# Patient Record
Sex: Female | Born: 1990 | Race: White | Hispanic: No | Marital: Married | Smoking: Never smoker
Health system: Southern US, Community
[De-identification: ages and names within clinical notes are randomized; demographics above are authoritative.]

## PROBLEM LIST (undated history)

## (undated) DIAGNOSIS — F909 Attention-deficit hyperactivity disorder, unspecified type: Secondary | ICD-10-CM

## (undated) DIAGNOSIS — F32A Depression, unspecified: Secondary | ICD-10-CM

## (undated) DIAGNOSIS — F329 Major depressive disorder, single episode, unspecified: Secondary | ICD-10-CM

## (undated) HISTORY — DX: Depression, unspecified: F32.A

## (undated) HISTORY — DX: Attention-deficit hyperactivity disorder, unspecified type: F90.9

---
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
## 2016-04-02 MED FILL — DEXTROAMP-AMP 10 MG TAB: 10 | 30 days supply | Qty: 60 | Fill #0

## 2016-04-28 DIAGNOSIS — Z124 Encounter for screening for malignant neoplasm of cervix: Secondary | ICD-10-CM | POA: Diagnosis not present

## 2016-04-28 DIAGNOSIS — Z6826 Body mass index (BMI) 26.0-26.9, adult: Secondary | ICD-10-CM | POA: Diagnosis not present

## 2016-04-28 DIAGNOSIS — Z01419 Encounter for gynecological examination (general) (routine) without abnormal findings: Secondary | ICD-10-CM | POA: Diagnosis not present

## 2016-05-01 DIAGNOSIS — Z30430 Encounter for insertion of intrauterine contraceptive device: Secondary | ICD-10-CM | POA: Diagnosis not present

## 2016-05-01 MED FILL — DEXTROAMP-AMP 10 MG TAB: 10 | 30 days supply | Qty: 60 | Fill #0

## 2016-06-04 MED FILL — predniSONE 10 MG TABS: 10 | 6 days supply | Qty: 21 | Fill #0

## 2016-07-15 MED FILL — TRETINOIN 0.025% CREAM: 0.025 | 7 days supply | Qty: 20 | Fill #0

## 2017-01-19 DIAGNOSIS — N912 Amenorrhea, unspecified: Secondary | ICD-10-CM | POA: Insufficient documentation

## 2017-01-19 DIAGNOSIS — G4700 Insomnia, unspecified: Secondary | ICD-10-CM | POA: Insufficient documentation

## 2017-01-19 DIAGNOSIS — R002 Palpitations: Secondary | ICD-10-CM | POA: Insufficient documentation

## 2017-01-19 DIAGNOSIS — J309 Allergic rhinitis, unspecified: Secondary | ICD-10-CM | POA: Insufficient documentation

## 2017-01-19 HISTORY — DX: Insomnia, unspecified: G47.00

## 2017-01-20 MED FILL — AMPHETAMINE SALTS 10 MG TAB: 10 | 30 days supply | Qty: 60 | Fill #0

## 2017-02-18 MED FILL — DEXTROAMP-AMP 10 MG TAB: 10 | 30 days supply | Qty: 60 | Fill #0

## 2017-03-19 MED FILL — DEXTROAMP-AMP 10 MG TAB: 10 | 30 days supply | Qty: 60 | Fill #0

## 2017-05-03 MED FILL — TRETINOIN 0.025% CREAM: 0.025 | 7 days supply | Qty: 20 | Fill #1

## 2017-09-02 DIAGNOSIS — Z3491 Encounter for supervision of normal pregnancy, unspecified, first trimester: Secondary | ICD-10-CM | POA: Diagnosis not present

## 2017-09-02 DIAGNOSIS — Z113 Encounter for screening for infections with a predominantly sexual mode of transmission: Secondary | ICD-10-CM | POA: Diagnosis not present

## 2017-09-02 DIAGNOSIS — Z3490 Encounter for supervision of normal pregnancy, unspecified, unspecified trimester: Secondary | ICD-10-CM | POA: Insufficient documentation

## 2017-09-02 LAB — OB RESULTS CONSOLE HEPATITIS B SURFACE ANTIGEN: Hepatitis B Surface Ag: NEGATIVE

## 2017-09-02 LAB — OB RESULTS CONSOLE HIV ANTIBODY (ROUTINE TESTING): HIV: NONREACTIVE

## 2017-09-02 LAB — OB RESULTS CONSOLE RUBELLA ANTIBODY, IGM: Rubella: IMMUNE

## 2017-09-02 LAB — OB RESULTS CONSOLE GC/CHLAMYDIA
CHLAMYDIA, DNA PROBE: NEGATIVE
Gonorrhea: NEGATIVE

## 2017-09-02 LAB — OB RESULTS CONSOLE RPR: RPR: NONREACTIVE

## 2017-09-02 LAB — OB RESULTS CONSOLE ABO/RH: RH Type: POSITIVE

## 2017-09-02 LAB — OB RESULTS CONSOLE ANTIBODY SCREEN: ANTIBODY SCREEN: NEGATIVE

## 2017-09-04 DIAGNOSIS — Z9889 Other specified postprocedural states: Secondary | ICD-10-CM

## 2017-09-04 DIAGNOSIS — O99810 Abnormal glucose complicating pregnancy: Secondary | ICD-10-CM | POA: Insufficient documentation

## 2017-09-04 DIAGNOSIS — Z91048 Other nonmedicinal substance allergy status: Secondary | ICD-10-CM | POA: Insufficient documentation

## 2017-09-04 HISTORY — DX: Other specified postprocedural states: Z98.890

## 2017-09-04 HISTORY — DX: Abnormal glucose complicating pregnancy: O99.810

## 2017-09-07 DIAGNOSIS — Z3A09 9 weeks gestation of pregnancy: Secondary | ICD-10-CM | POA: Diagnosis not present

## 2017-09-07 DIAGNOSIS — O99810 Abnormal glucose complicating pregnancy: Secondary | ICD-10-CM | POA: Diagnosis not present

## 2017-09-07 DIAGNOSIS — Z3491 Encounter for supervision of normal pregnancy, unspecified, first trimester: Secondary | ICD-10-CM | POA: Diagnosis not present

## 2017-09-07 DIAGNOSIS — O3680X9 Pregnancy with inconclusive fetal viability, other fetus: Secondary | ICD-10-CM | POA: Diagnosis not present

## 2017-09-07 DIAGNOSIS — Z3A01 Less than 8 weeks gestation of pregnancy: Secondary | ICD-10-CM | POA: Diagnosis not present

## 2017-09-22 MED FILL — BUPROPION SR 150 MG TABLET: 150 | 30 days supply | Qty: 60 | Fill #0

## 2017-09-30 DIAGNOSIS — Z3481 Encounter for supervision of other normal pregnancy, first trimester: Secondary | ICD-10-CM | POA: Diagnosis not present

## 2017-10-20 MED FILL — BUPROPION SR 150 MG TABLET: 150 | 30 days supply | Qty: 60 | Fill #1

## 2017-11-22 MED FILL — BUPROPION SR 150 MG TABLET: 150 | 30 days supply | Qty: 60 | Fill #2

## 2017-11-24 DIAGNOSIS — Z363 Encounter for antenatal screening for malformations: Secondary | ICD-10-CM | POA: Diagnosis not present

## 2017-11-24 DIAGNOSIS — Z3A20 20 weeks gestation of pregnancy: Secondary | ICD-10-CM | POA: Diagnosis not present

## 2017-12-11 DIAGNOSIS — M79662 Pain in left lower leg: Secondary | ICD-10-CM | POA: Diagnosis not present

## 2017-12-11 DIAGNOSIS — M79605 Pain in left leg: Secondary | ICD-10-CM | POA: Diagnosis not present

## 2017-12-22 MED FILL — BUPROPION SR 150 MG TABLET: 150 | 30 days supply | Qty: 60 | Fill #0

## 2018-01-20 DIAGNOSIS — Z23 Encounter for immunization: Secondary | ICD-10-CM | POA: Diagnosis not present

## 2018-01-20 DIAGNOSIS — Z3493 Encounter for supervision of normal pregnancy, unspecified, third trimester: Secondary | ICD-10-CM | POA: Diagnosis not present

## 2018-01-20 DIAGNOSIS — Z3A28 28 weeks gestation of pregnancy: Secondary | ICD-10-CM | POA: Diagnosis not present

## 2018-01-24 MED FILL — BUPROPION SR 150 MG TABLET: 150 | 30 days supply | Qty: 60 | Fill #1

## 2018-01-28 DIAGNOSIS — Z369 Encounter for antenatal screening, unspecified: Secondary | ICD-10-CM | POA: Diagnosis not present

## 2018-01-31 DIAGNOSIS — Z3A29 29 weeks gestation of pregnancy: Secondary | ICD-10-CM | POA: Diagnosis not present

## 2018-01-31 DIAGNOSIS — O26849 Uterine size-date discrepancy, unspecified trimester: Secondary | ICD-10-CM | POA: Diagnosis not present

## 2018-02-08 MED FILL — FUSION PLUS CAPSULE: 30 days supply | Qty: 30 | Fill #0

## 2018-02-09 DIAGNOSIS — O99810 Abnormal glucose complicating pregnancy: Secondary | ICD-10-CM | POA: Diagnosis not present

## 2018-02-21 MED FILL — BUPROPION SR 150 MG TABLET: 150 | 30 days supply | Qty: 60 | Fill #2

## 2018-03-03 DIAGNOSIS — Z3A34 34 weeks gestation of pregnancy: Secondary | ICD-10-CM | POA: Diagnosis not present

## 2018-03-03 DIAGNOSIS — Z3493 Encounter for supervision of normal pregnancy, unspecified, third trimester: Secondary | ICD-10-CM | POA: Diagnosis not present

## 2018-03-03 DIAGNOSIS — O321XX9 Maternal care for breech presentation, other fetus: Secondary | ICD-10-CM | POA: Diagnosis not present

## 2018-03-17 ENCOUNTER — Other Ambulatory Visit: Payer: Self-pay | Admitting: Obstetrics and Gynecology

## 2018-03-17 DIAGNOSIS — Z3492 Encounter for supervision of normal pregnancy, unspecified, second trimester: Secondary | ICD-10-CM | POA: Diagnosis not present

## 2018-03-18 ENCOUNTER — Other Ambulatory Visit: Payer: Self-pay | Admitting: Obstetrics & Gynecology

## 2018-03-22 ENCOUNTER — Encounter (HOSPITAL_COMMUNITY): Payer: Self-pay | Admitting: *Deleted

## 2018-03-22 ENCOUNTER — Inpatient Hospital Stay (HOSPITAL_COMMUNITY)
Admission: AD | Admit: 2018-03-22 | Discharge: 2018-03-26 | DRG: 788 | Disposition: A | Discharge status: Home / Self Care | Payer: 59 | Attending: Obstetrics and Gynecology | Admitting: Obstetrics and Gynecology

## 2018-03-22 ENCOUNTER — Telehealth (HOSPITAL_COMMUNITY): Payer: Self-pay | Admitting: *Deleted

## 2018-03-22 ENCOUNTER — Inpatient Hospital Stay (HOSPITAL_BASED_OUTPATIENT_CLINIC_OR_DEPARTMENT_OTHER): Payer: 59

## 2018-03-22 DIAGNOSIS — Z3493 Encounter for supervision of normal pregnancy, unspecified, third trimester: Secondary | ICD-10-CM | POA: Diagnosis not present

## 2018-03-22 DIAGNOSIS — O368190 Decreased fetal movements, unspecified trimester, not applicable or unspecified: Secondary | ICD-10-CM

## 2018-03-22 DIAGNOSIS — O368130 Decreased fetal movements, third trimester, not applicable or unspecified: Secondary | ICD-10-CM | POA: Diagnosis present

## 2018-03-22 DIAGNOSIS — Z3A00 Weeks of gestation of pregnancy not specified: Secondary | ICD-10-CM | POA: Diagnosis not present

## 2018-03-22 DIAGNOSIS — O321XX0 Maternal care for breech presentation, not applicable or unspecified: Secondary | ICD-10-CM | POA: Diagnosis not present

## 2018-03-22 DIAGNOSIS — Z98891 History of uterine scar from previous surgery: Secondary | ICD-10-CM

## 2018-03-22 DIAGNOSIS — Z3A37 37 weeks gestation of pregnancy: Secondary | ICD-10-CM

## 2018-03-22 DIAGNOSIS — O368199 Decreased fetal movements, unspecified trimester, other fetus: Secondary | ICD-10-CM | POA: Diagnosis not present

## 2018-03-22 DIAGNOSIS — Z369 Encounter for antenatal screening, unspecified: Secondary | ICD-10-CM | POA: Diagnosis not present

## 2018-03-22 DIAGNOSIS — O26853 Spotting complicating pregnancy, third trimester: Secondary | ICD-10-CM | POA: Diagnosis not present

## 2018-03-22 LAB — URINALYSIS, ROUTINE W REFLEX MICROSCOPIC
BILIRUBIN URINE: NEGATIVE
Glucose, UA: NEGATIVE mg/dL
KETONES UR: NEGATIVE mg/dL
Leukocytes, UA: NEGATIVE
NITRITE: NEGATIVE
PROTEIN: NEGATIVE mg/dL
Specific Gravity, Urine: 1.009 (ref 1.005–1.030)
pH: 6 (ref 5.0–8.0)

## 2018-03-22 LAB — CBC
HCT: 36.9 % (ref 36.0–46.0)
Hemoglobin: 12.5 g/dL (ref 12.0–15.0)
MCH: 30.6 pg (ref 26.0–34.0)
MCHC: 33.9 g/dL (ref 30.0–36.0)
MCV: 90.4 fL (ref 78.0–100.0)
PLATELETS: 236 10*3/uL (ref 150–400)
RBC: 4.08 MIL/uL (ref 3.87–5.11)
RDW: 13.8 % (ref 11.5–15.5)
WBC: 14.6 10*3/uL — ABNORMAL HIGH (ref 4.0–10.5)

## 2018-03-22 LAB — TYPE AND SCREEN
ABO/RH(D): A POS
Antibody Screen: NEGATIVE

## 2018-03-22 LAB — ABO/RH: ABO/RH(D): A POS

## 2018-03-22 IMAGING — US US MFM FETAL BPP W/O NON-STRESS
1 series · 12 of 24 positions shown · non-contrast
Comparison: none

[Series 1: us mfm fetal bpp w/o non-stress · 24 acquisitions, 12 frames shown]
[im 2/24]
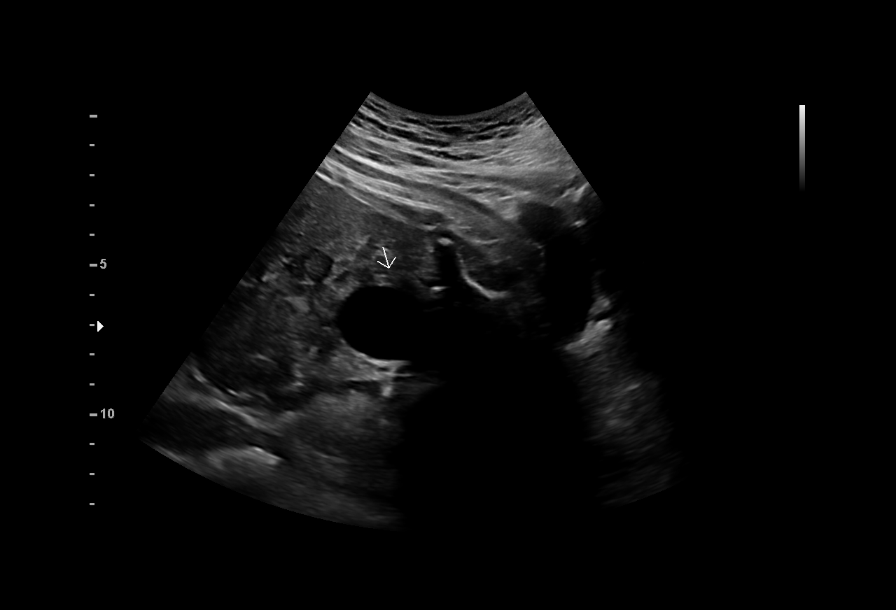
[im 4/24]
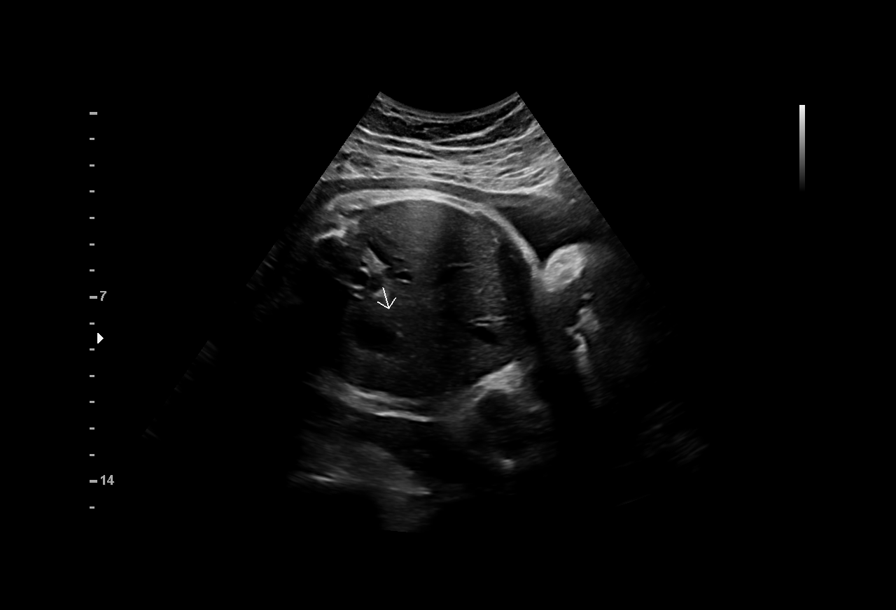
[im 6/24]
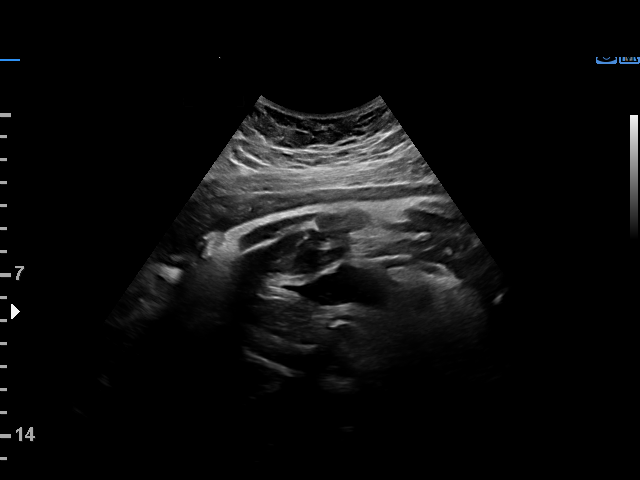
[im 8/24]
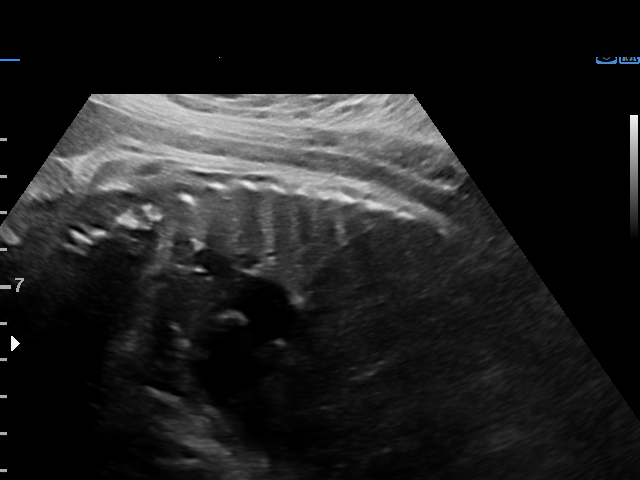
[im 10/24]
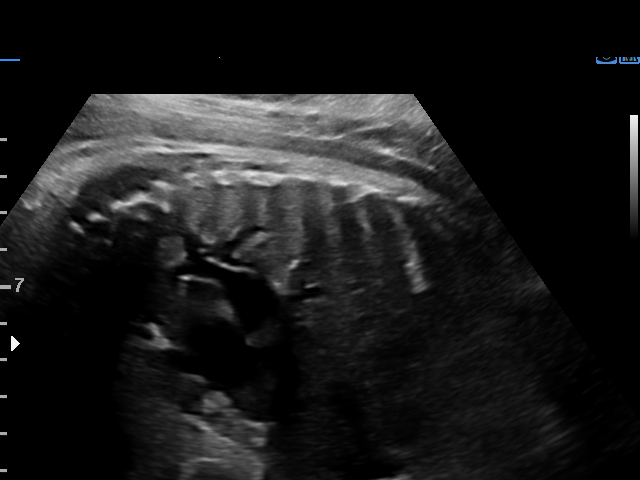
[im 12/24]
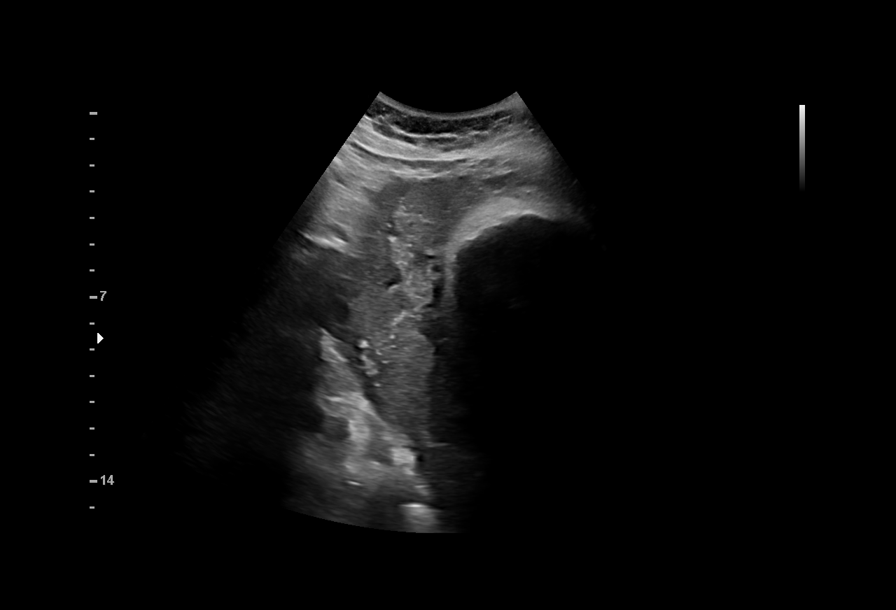
[im 14/24]
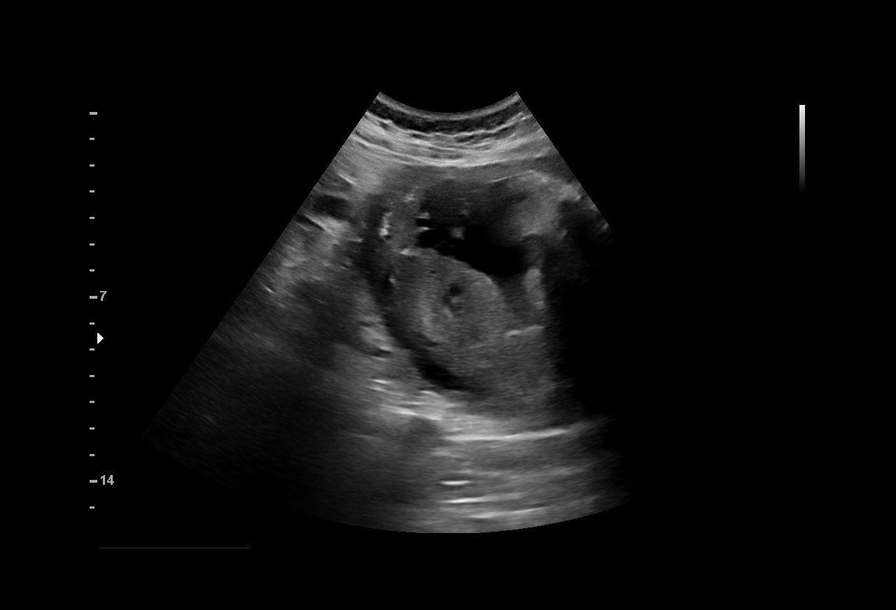
[im 16/24]
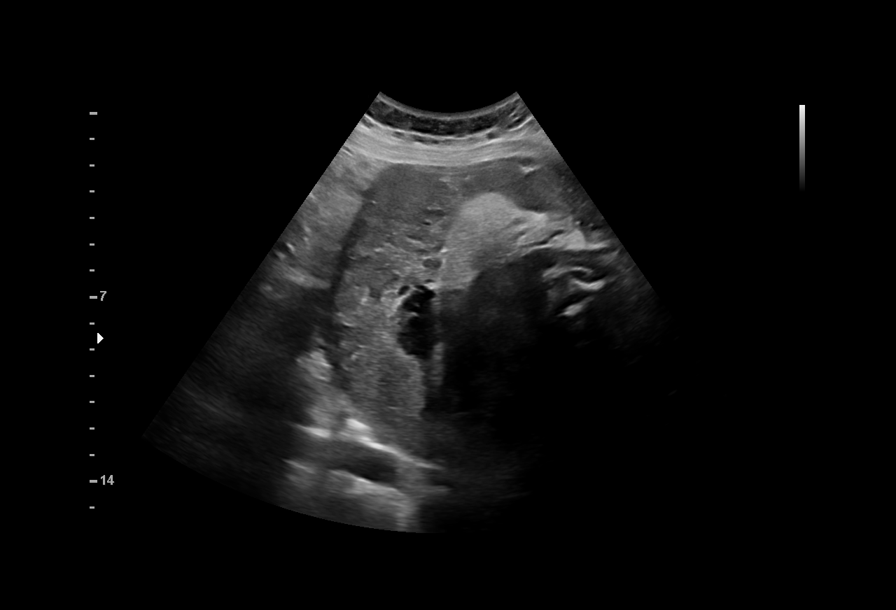
[im 18/24]
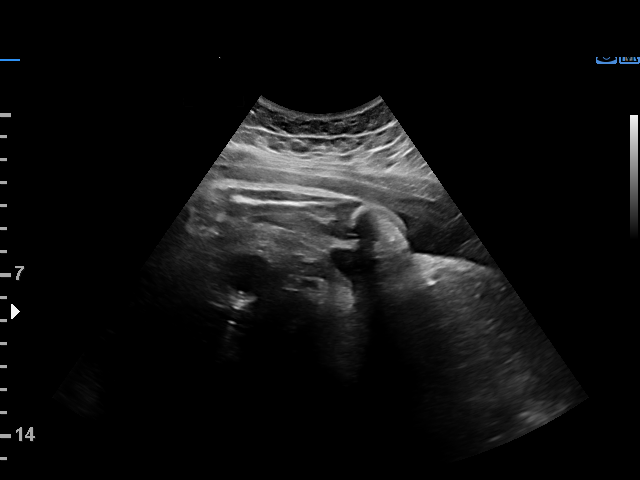
[im 20/24]
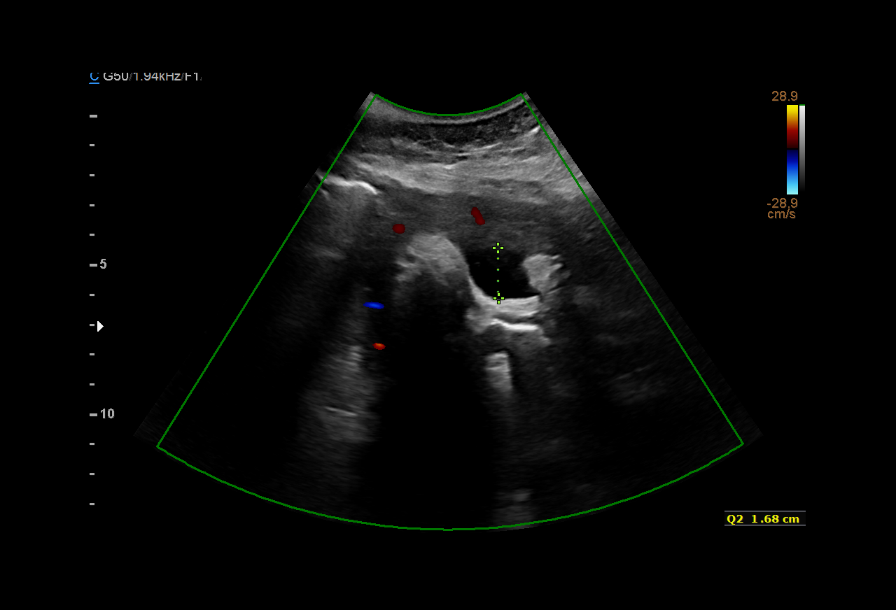
[im 22/24]
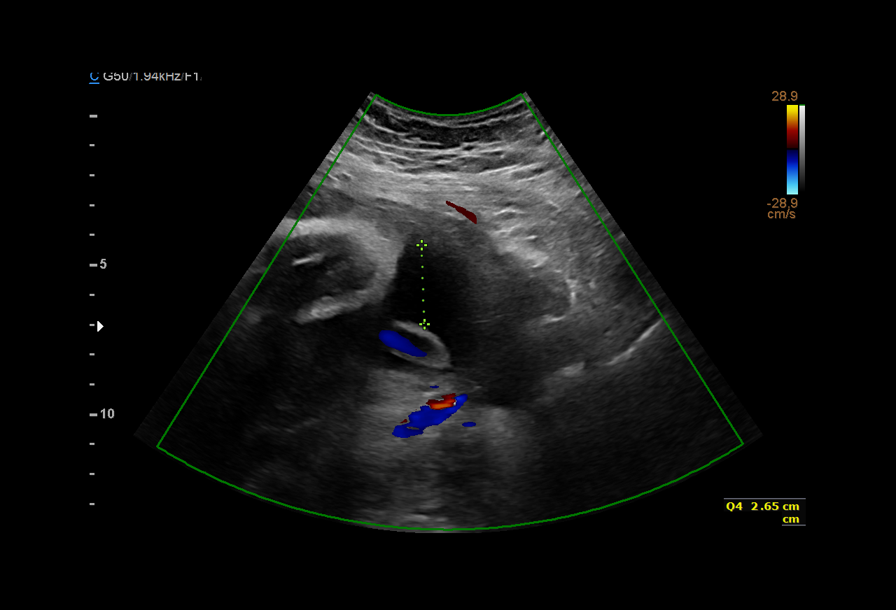
[im 24/24]
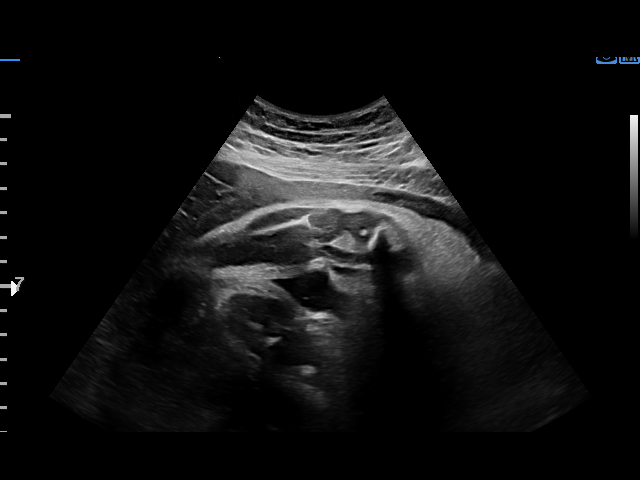

[12 of 24 positions shown; findings below may reference images not displayed]

1  HALEKA           [PHONE_NUMBER]      [PHONE_NUMBER]     [PHONE_NUMBER]
Indications

37 weeks gestation of pregnancy
Decreased fetal movement                       [RE]
OB History

Gravidity:    1
Fetal Evaluation

Num Of Fetuses:     1
Fetal Heart         140
Rate(bpm):
Cardiac Activity:   Observed
Presentation:       Breech
Placenta:           Posterior

Amniotic Fluid
AFI FV:      Subjectively within normal limits

AFI Sum(cm)     %Tile       Largest Pocket(cm)
7.73            7

RUQ(cm)       RLQ(cm)       LUQ(cm)        LLQ(cm)
3.4           2.65          1.68           0
Biophysical Evaluation

Amniotic F.V:   Within normal limits       F. Tone:        Observed
F. Movement:    Observed                   Score:          [DATE]
F. Breathing:   Observed
Gestational Age

Clinical EDD:  37w 0d                                        EDD:   [DATE]
Best:          37w 0d     Det. By:  Clinical EDD             EDD:   [DATE]
Impression

Patient was evaluated for decreased fetal movements and
vaginal bleeding.
Antenatal testing is reassuring. BPP [DATE]. BREECH
presentation.

## 2018-03-22 MED ORDER — ONDANSETRON HCL 4 MG/2ML IJ SOLN
INTRAMUSCULAR | Status: AC
  Filled 2018-03-22: qty 2

## 2018-03-22 MED ORDER — DEXAMETHASONE SODIUM PHOSPHATE 4 MG/ML IJ SOLN
INTRAMUSCULAR | Status: AC
  Filled 2018-03-22: qty 1

## 2018-03-22 MED ORDER — OXYTOCIN 10 UNIT/ML IJ SOLN
INTRAMUSCULAR | Status: AC
  Filled 2018-03-22: qty 4

## 2018-03-22 MED ORDER — LACTATED RINGERS IV SOLN
INTRAVENOUS | Status: DC
  Administered 2018-03-22 – 2018-03-23 (×2): via INTRAVENOUS

## 2018-03-22 MED ORDER — CEFAZOLIN SODIUM-DEXTROSE 2-4 GM/100ML-% IV SOLN
2.0000 g | INTRAVENOUS | Status: AC
  Administered 2018-03-23: 2 g via INTRAVENOUS
  Filled 2018-03-22: qty 100

## 2018-03-22 MED ORDER — FENTANYL CITRATE (PF) 100 MCG/2ML IJ SOLN
INTRAMUSCULAR | Status: AC
  Filled 2018-03-22: qty 2

## 2018-03-22 MED ORDER — PHENYLEPHRINE 8 MG IN D5W 100 ML (0.08MG/ML) PREMIX OPTIME
INJECTION | INTRAVENOUS | Status: AC
  Filled 2018-03-22: qty 100

## 2018-03-22 MED ORDER — FAMOTIDINE IN NACL 20-0.9 MG/50ML-% IV SOLN
20.0000 mg | Freq: Once | INTRAVENOUS | Status: AC
  Administered 2018-03-22: 20 mg via INTRAVENOUS
  Filled 2018-03-22: qty 50

## 2018-03-22 MED ORDER — SCOPOLAMINE 1 MG/3DAYS TD PT72
MEDICATED_PATCH | TRANSDERMAL | Status: AC
  Filled 2018-03-22: qty 1

## 2018-03-22 MED ORDER — SOD CITRATE-CITRIC ACID 500-334 MG/5ML PO SOLN
30.0000 mL | Freq: Once | ORAL | Status: AC
  Administered 2018-03-23: 30 mL via ORAL
  Filled 2018-03-22: qty 15

## 2018-03-22 NOTE — Anesthesia Preprocedure Evaluation (Signed)
Anesthesia Evaluation  Patient identified by MRN, date of birth, ID band Patient awake    Reviewed: Allergy & Precautions, H&P , NPO status , Patient's Chart, lab work & pertinent test results  Airway Mallampati: I  TM Distance: >3 FB Neck ROM: full    Dental no notable dental hx. (+) Teeth Intact   Pulmonary neg pulmonary ROS,    Pulmonary exam normal breath sounds clear to auscultation       Cardiovascular negative cardio ROS Normal cardiovascular exam Rhythm:regular Rate:Normal     Neuro/Psych negative neurological ROS  negative psych ROS   GI/Hepatic negative GI ROS, Neg liver ROS,   Endo/Other  negative endocrine ROS  Renal/GU negative Renal ROS  negative genitourinary   Musculoskeletal   Abdominal   Peds  Hematology negative hematology ROS (+)   Anesthesia Other Findings   Reproductive/Obstetrics (+) Pregnancy                             Anesthesia Physical Anesthesia Plan  ASA: II  Anesthesia Plan: Spinal   Post-op Pain Management:    Induction:   PONV Risk Score and Plan: 3 and Ondansetron, Dexamethasone and Scopolamine patch - Pre-op  Airway Management Planned: Natural Airway and Nasal Cannula  Additional Equipment:   Intra-op Plan:   Post-operative Plan:   Informed Consent: I have reviewed the patients History and Physical, chart, labs and discussed the procedure including the risks, benefits and alternatives for the proposed anesthesia with the patient or authorized representative who has indicated his/her understanding and acceptance.     Plan Discussed with: CRNA and Surgeon  Anesthesia Plan Comments:         Anesthesia Quick Evaluation

## 2018-03-22 NOTE — Telephone Encounter (Signed)
Preadmission screen  

## 2018-03-22 NOTE — H&P (Addendum)
Catherine Chang is a 27 y.o. female presenting for cesarean section due to breech presentation with labor and vaginal bleeding. OB History    Gravida  1   Para      Term      Preterm      AB      Living        SAB      TAB      Ectopic      Multiple      Live Births             History reviewed. No pertinent past medical history. Past Surgical History:  Procedure Laterality Date  . BREAST REDUCTION SURGERY Bilateral 2006   Family History: family history includes Heart disease in her maternal grandfather and paternal grandfather; Hypothyroidism in her mother. Social History:  reports that she has never smoked. She has never used smokeless tobacco. She reports that she does not drink alcohol or use drugs.     Maternal Diabetes: No Genetic Screening: Normal Maternal Ultrasounds/Referrals: Normal Fetal Ultrasounds or other Referrals:  None Maternal Substance Abuse:  No Significant Maternal Medications:  None Significant Maternal Lab Results:  None Other Comments:  None  Review of Systems  Gastrointestinal: Positive for abdominal pain.  All other systems reviewed and are negative.  Maternal Medical History:  Reason for admission: Contractions and vaginal bleeding.   Contractions: Onset was 3-5 hours ago.   Frequency: regular.   Duration is approximately 60 seconds.   Perceived severity is moderate.    Fetal activity: Perceived fetal activity is normal.   Last perceived fetal movement was within the past hour.    Prenatal complications: Bleeding.   Prenatal Complications - Diabetes: none.    Dilation: 1 Effacement (%): 50 Station: -2 Exam by:: Ranee Gosselin, [name]   Vitals:   03/22/18 1848 03/22/18 1910 03/22/18 2226  BP: 123/78    Pulse: 97    Resp: 16    Temp: 98.5 F (36.9 C) 98.5 F (36.9 C)   TempSrc: Oral    SpO2: 99%    Weight:   80 kg (176 lb 6 oz)     No results found for this or any previous visit (from the past 24  hour(s)).  Maternal Exam:  Uterine Assessment: Contraction strength is moderate.  Contraction duration is 60 seconds. Contraction frequency is irregular.   Abdomen: Patient reports no abdominal tenderness. Fundal height is Size=dates.   Estimated fetal weight is 5lbs14oz.   Fetal presentation: breech  Introitus: Normal vulva. Normal vagina.  Ferning test: negative.   Cervix: Cervix evaluated by digital exam.     Fetal Exam Fetal Monitor Review: Mode: ultrasound.   Baseline rate: 140.  Variability: moderate (6-25 bpm).   Pattern: accelerations present and no decelerations.    Fetal State Assessment: Category I - tracings are normal.     Physical Exam  Nursing note and vitals reviewed. Constitutional: She is oriented to person, place, and time. She appears well-developed and well-nourished.  HENT:  Head: Normocephalic.  Eyes: Pupils are equal, round, and reactive to light.  Cardiovascular: Normal rate, regular rhythm and normal heart sounds.  Respiratory: Effort normal and breath sounds normal.  Genitourinary: Vagina normal and uterus normal.  Musculoskeletal: Normal range of motion.  Neurological: She is alert and oriented to person, place, and time.  Skin: Skin is warm and dry.  Psychiatric: She has a normal mood and affect. Her behavior is normal. Judgment and thought content normal.  Prenatal labs: ABO, Rh: A/Positive/-- (01/17 0000) Antibody: Negative (01/17 0000) Rubella: Immune (01/17 0000) RPR: Nonreactive (01/17 0000)  HBsAg: Negative (01/17 0000)  HIV: Non-reactive (01/17 0000)  GBS:   Negative  Assessment/Plan: 27 y.o. G1P0 at 37 weeks Category 1 FHTs, reactive NST BPP 8/8 Labor and vaginal bleeding  Breech presentation Admit to Pre-op Cesarean section with Dr. Kerry [place] 03/22/2018, 10:21 PM  Agree with above.  Called by Ranee Gosselin, [name] with report that when pt stood a gush of blood came out and she thought the pt was abrupting and  had made change from closed to 1cm.  Lissa Merlin reported increasingly painful contractions.  Pt has no risk factors for abruption and tracing is cat 1.  I discussed proceeding with c/s which Lissa Merlin had already discussed with the patient.  The r/b/a were reviewed including but not limited to bleeding infection and injury.  Pt agreeable to proceed with cesarean section.  Ready to proceed at 1108 (03/22/18) when called by blood bank stating T&S was resulted.

## 2018-03-23 ENCOUNTER — Inpatient Hospital Stay (HOSPITAL_COMMUNITY): Payer: 59 | Admitting: Anesthesiology

## 2018-03-23 ENCOUNTER — Encounter (HOSPITAL_COMMUNITY)
Admission: AD | Disposition: A | Discharge status: Home / Self Care | Payer: Self-pay | Source: Home / Self Care | Attending: Obstetrics and Gynecology

## 2018-03-23 ENCOUNTER — Encounter (HOSPITAL_COMMUNITY): Payer: Self-pay | Admitting: *Deleted

## 2018-03-23 DIAGNOSIS — Z98891 History of uterine scar from previous surgery: Secondary | ICD-10-CM

## 2018-03-23 LAB — RPR: RPR Ser Ql: NONREACTIVE

## 2018-03-23 LAB — CBC
HCT: 32.8 % — ABNORMAL LOW (ref 36.0–46.0)
Hemoglobin: 11.2 g/dL — ABNORMAL LOW (ref 12.0–15.0)
MCH: 31 pg (ref 26.0–34.0)
MCHC: 34.1 g/dL (ref 30.0–36.0)
MCV: 90.9 fL (ref 78.0–100.0)
PLATELETS: 198 10*3/uL (ref 150–400)
RBC: 3.61 MIL/uL — AB (ref 3.87–5.11)
RDW: 13.7 % (ref 11.5–15.5)
WBC: 17.5 10*3/uL — ABNORMAL HIGH (ref 4.0–10.5)

## 2018-03-23 SURGERY — Surgical Case
Anesthesia: Spinal

## 2018-03-23 MED ORDER — ACETAMINOPHEN 325 MG PO TABS: 650.0000 mg | ORAL_TABLET | ORAL | Status: DC | PRN

## 2018-03-23 MED ORDER — IBUPROFEN 600 MG PO TABS
600.0000 mg | ORAL_TABLET | Freq: Four times a day (QID) | ORAL | Status: DC
  Administered 2018-03-23 – 2018-03-26 (×14): 600 mg via ORAL
  Filled 2018-03-23 (×14): qty 1

## 2018-03-23 MED ORDER — OXYTOCIN 10 UNIT/ML IJ SOLN
INTRAMUSCULAR | Status: DC | PRN
  Administered 2018-03-23: 40 [IU] via INTRAVENOUS

## 2018-03-23 MED ORDER — ACETAMINOPHEN 10 MG/ML IV SOLN
INTRAVENOUS | Status: DC | PRN
  Administered 2018-03-23: 1000 mg via INTRAVENOUS

## 2018-03-23 MED ORDER — HYDROMORPHONE HCL 1 MG/ML IJ SOLN
INTRAMUSCULAR | Status: DC | PRN
  Administered 2018-03-23: 1 mg via INTRAVENOUS

## 2018-03-23 MED ORDER — ONDANSETRON HCL 4 MG/2ML IJ SOLN
INTRAMUSCULAR | Status: DC | PRN
  Administered 2018-03-23: 4 mg via INTRAVENOUS

## 2018-03-23 MED ORDER — HYDROMORPHONE HCL 1 MG/ML IJ SOLN
INTRAMUSCULAR | Status: AC
  Filled 2018-03-23: qty 0.5

## 2018-03-23 MED ORDER — SIMETHICONE 80 MG PO CHEW
80.0000 mg | CHEWABLE_TABLET | Freq: Three times a day (TID) | ORAL | Status: DC
  Administered 2018-03-23 – 2018-03-26 (×8): 80 mg via ORAL
  Filled 2018-03-23 (×9): qty 1

## 2018-03-23 MED ORDER — WITCH HAZEL-GLYCERIN EX PADS: 1.0000 "application " | MEDICATED_PAD | CUTANEOUS | Status: DC | PRN

## 2018-03-23 MED ORDER — MEPERIDINE HCL 25 MG/ML IJ SOLN: 6.2500 mg | INTRAMUSCULAR | Status: DC | PRN

## 2018-03-23 MED ORDER — MENTHOL 3 MG MT LOZG: 1.0000 | LOZENGE | OROMUCOSAL | Status: DC | PRN

## 2018-03-23 MED ORDER — DIBUCAINE 1 % RE OINT: 1.0000 "application " | TOPICAL_OINTMENT | RECTAL | Status: DC | PRN

## 2018-03-23 MED ORDER — SENNOSIDES-DOCUSATE SODIUM 8.6-50 MG PO TABS
2.0000 | ORAL_TABLET | ORAL | Status: DC
  Administered 2018-03-24 – 2018-03-25 (×3): 2 via ORAL
  Filled 2018-03-23 (×3): qty 2

## 2018-03-23 MED ORDER — SCOPOLAMINE 1 MG/3DAYS TD PT72
MEDICATED_PATCH | TRANSDERMAL | Status: DC | PRN
  Administered 2018-03-23: 1 via TRANSDERMAL

## 2018-03-23 MED ORDER — BUPIVACAINE IN DEXTROSE 0.75-8.25 % IT SOLN
INTRATHECAL | Status: DC | PRN
  Administered 2018-03-23: 1.6 mL via INTRATHECAL

## 2018-03-23 MED ORDER — SODIUM CHLORIDE 0.9 % IR SOLN
Status: DC | PRN
  Administered 2018-03-23: 1

## 2018-03-23 MED ORDER — PHENYLEPHRINE 8 MG IN D5W 100 ML (0.08MG/ML) PREMIX OPTIME
INJECTION | INTRAVENOUS | Status: DC | PRN
  Administered 2018-03-23: 60 ug/min via INTRAVENOUS

## 2018-03-23 MED ORDER — OXYTOCIN 40 UNITS IN LACTATED RINGERS INFUSION - SIMPLE MED: 2.5000 [IU]/h | INTRAVENOUS | Status: AC

## 2018-03-23 MED ORDER — LACTATED RINGERS IV SOLN
INTRAVENOUS | Status: DC
  Administered 2018-03-23: 15:00:00 via INTRAVENOUS

## 2018-03-23 MED ORDER — HYDROMORPHONE HCL 1 MG/ML IJ SOLN
INTRAMUSCULAR | Status: AC
  Filled 2018-03-23: qty 1

## 2018-03-23 MED ORDER — SIMETHICONE 80 MG PO CHEW
80.0000 mg | CHEWABLE_TABLET | ORAL | Status: DC | PRN
  Administered 2018-03-23: 80 mg via ORAL

## 2018-03-23 MED ORDER — KETOROLAC TROMETHAMINE 30 MG/ML IJ SOLN
INTRAMUSCULAR | Status: AC
  Filled 2018-03-23: qty 1

## 2018-03-23 MED ORDER — OXYCODONE HCL 5 MG PO TABS
10.0000 mg | ORAL_TABLET | ORAL | Status: DC | PRN
  Administered 2018-03-23 – 2018-03-25 (×8): 10 mg via ORAL
  Filled 2018-03-23 (×7): qty 2

## 2018-03-23 MED ORDER — TETANUS-DIPHTH-ACELL PERTUSSIS 5-2.5-18.5 LF-MCG/0.5 IM SUSP: 0.5000 mL | Freq: Once | INTRAMUSCULAR | Status: DC

## 2018-03-23 MED ORDER — PROMETHAZINE HCL 25 MG/ML IJ SOLN: 6.2500 mg | INTRAMUSCULAR | Status: DC | PRN

## 2018-03-23 MED ORDER — SCOPOLAMINE 1 MG/3DAYS TD PT72
MEDICATED_PATCH | TRANSDERMAL | Status: AC
  Filled 2018-03-23: qty 1

## 2018-03-23 MED ORDER — KETOROLAC TROMETHAMINE 30 MG/ML IJ SOLN
30.0000 mg | Freq: Once | INTRAMUSCULAR | Status: AC | PRN
  Administered 2018-03-23: 30 mg via INTRAVENOUS

## 2018-03-23 MED ORDER — DIPHENHYDRAMINE HCL 25 MG PO CAPS
25.0000 mg | ORAL_CAPSULE | Freq: Four times a day (QID) | ORAL | Status: DC | PRN
Start: 2018-03-23 — End: 2018-03-26

## 2018-03-23 MED ORDER — MEDROXYPROGESTERONE ACETATE 150 MG/ML IM SUSP: 150.0000 mg | INTRAMUSCULAR | Status: DC | PRN

## 2018-03-23 MED ORDER — ZOLPIDEM TARTRATE 5 MG PO TABS: 5.0000 mg | ORAL_TABLET | Freq: Every evening | ORAL | Status: DC | PRN

## 2018-03-23 MED ORDER — COCONUT OIL OIL: 1.0000 "application " | TOPICAL_OIL | Status: DC | PRN

## 2018-03-23 MED ORDER — SIMETHICONE 80 MG PO CHEW
80.0000 mg | CHEWABLE_TABLET | ORAL | Status: DC
  Administered 2018-03-24 – 2018-03-25 (×3): 80 mg via ORAL
  Filled 2018-03-23 (×3): qty 1

## 2018-03-23 MED ORDER — DEXAMETHASONE SODIUM PHOSPHATE 4 MG/ML IJ SOLN
INTRAMUSCULAR | Status: DC | PRN
  Administered 2018-03-23: 4 mg via INTRAVENOUS

## 2018-03-23 MED ORDER — OXYCODONE HCL 5 MG PO TABS
5.0000 mg | ORAL_TABLET | ORAL | Status: DC | PRN
  Administered 2018-03-23 – 2018-03-26 (×7): 5 mg via ORAL
  Filled 2018-03-23 (×9): qty 1

## 2018-03-23 MED ORDER — FENTANYL CITRATE (PF) 100 MCG/2ML IJ SOLN
INTRAMUSCULAR | Status: DC | PRN
  Administered 2018-03-23: 20 ug via INTRATHECAL

## 2018-03-23 MED ORDER — HYDROMORPHONE HCL 1 MG/ML IJ SOLN
0.2500 mg | INTRAMUSCULAR | Status: DC | PRN
  Administered 2018-03-23: 0.25 mg via INTRAVENOUS
  Administered 2018-03-23: 0.5 mg via INTRAVENOUS

## 2018-03-23 MED ORDER — PRENATAL MULTIVITAMIN CH
1.0000 | ORAL_TABLET | Freq: Every day | ORAL | Status: DC
  Administered 2018-03-23 – 2018-03-26 (×4): 1 via ORAL
  Filled 2018-03-23 (×4): qty 1

## 2018-03-23 SURGICAL SUPPLY — 36 items
BENZOIN TINCTURE PRP APPL 2/3 (GAUZE/BANDAGES/DRESSINGS) ×2 IMPLANT
CHLORAPREP W/TINT 26ML (MISCELLANEOUS) ×2 IMPLANT
CLAMP CORD UMBIL (MISCELLANEOUS) IMPLANT
CLOTH BEACON ORANGE TIMEOUT ST (SAFETY) ×2 IMPLANT
CLSR STERI-STRIP ANTIMIC 1/2X4 (GAUZE/BANDAGES/DRESSINGS) ×2 IMPLANT
DRSG OPSITE POSTOP 4X10 (GAUZE/BANDAGES/DRESSINGS) ×2 IMPLANT
ELECT REM PT RETURN 9FT ADLT (ELECTROSURGICAL) ×2
ELECTRODE REM PT RTRN 9FT ADLT (ELECTROSURGICAL) ×1 IMPLANT
EXTRACTOR VACUUM BELL STYLE (SUCTIONS) IMPLANT
EXTRACTOR VACUUM KIWI (MISCELLANEOUS) IMPLANT
GLOVE BIO SURGEON STRL SZ 6.5 (GLOVE) ×2 IMPLANT
GLOVE BIOGEL PI IND STRL 7.0 (GLOVE) ×2 IMPLANT
GLOVE BIOGEL PI INDICATOR 7.0 (GLOVE) ×2
GOWN STRL REUS W/TWL LRG LVL3 (GOWN DISPOSABLE) ×6 IMPLANT
KIT ABG SYR 3ML LUER SLIP (SYRINGE) IMPLANT
NEEDLE HYPO 22GX1.5 SAFETY (NEEDLE) IMPLANT
NEEDLE HYPO 25X5/8 SAFETYGLIDE (NEEDLE) IMPLANT
NS IRRIG 1000ML POUR BTL (IV SOLUTION) ×2 IMPLANT
PACK C SECTION WH (CUSTOM PROCEDURE TRAY) ×2 IMPLANT
PAD OB MATERNITY 4.3X12.25 (PERSONAL CARE ITEMS) ×2 IMPLANT
PENCIL SMOKE EVAC W/HOLSTER (ELECTROSURGICAL) ×2 IMPLANT
RETAINER VISCERAL (MISCELLANEOUS) ×2 IMPLANT
RTRCTR C-SECT PINK 25CM LRG (MISCELLANEOUS) ×2 IMPLANT
STRIP CLOSURE SKIN 1/2X4 (GAUZE/BANDAGES/DRESSINGS) ×2 IMPLANT
SUT CHROMIC 2 0 CT 1 (SUTURE) ×4 IMPLANT
SUT MNCRL 0 VIOLET CTX 36 (SUTURE) ×2 IMPLANT
SUT MONOCRYL 0 CTX 36 (SUTURE) ×2
SUT PDS AB 0 CTX 36 PDP370T (SUTURE) IMPLANT
SUT PLAIN 2 0 (SUTURE)
SUT PLAIN ABS 2-0 CT1 27XMFL (SUTURE) IMPLANT
SUT VIC AB 0 CTX 36 (SUTURE) ×2
SUT VIC AB 0 CTX36XBRD ANBCTRL (SUTURE) ×2 IMPLANT
SUT VIC AB 4-0 KS 27 (SUTURE) ×2 IMPLANT
SYR CONTROL 10ML LL (SYRINGE) IMPLANT
TOWEL OR 17X24 6PK STRL BLUE (TOWEL DISPOSABLE) ×2 IMPLANT
TRAY FOLEY W/BAG SLVR 14FR LF (SET/KITS/TRAYS/PACK) ×2 IMPLANT

## 2018-03-23 NOTE — Op Note (Addendum)
Cesarean Section Procedure Note  Indications: P0 at 37 1/7 wks presenting with decreased fetal movement, contractions, cervical change and bleeding.  Pre-operative Diagnosis: 1.37 1/7wks 2.Breech Presentation 3.Labor 4.Bleeding   Post-operative Diagnosis: 1.37 1/7wks 2.Breech Presentation 3.Labor 4.Bleeding   Procedure: Primary Low Transverse Cesarean Section  Surgeon: Everett Graff, MD    Assistants: Ranee Gosselin, [name]  Anesthesia: Regional  Anesthesiologist: Lyn Hollingshead, MD   Procedure Details  The patient was taken to the operating room secondary to breech, labor and bleeding after the risks, benefits, complications, treatment options, and expected outcomes were discussed with the patient.  The patient concurred with the proposed plan, giving informed consent which was signed and witnessed. The patient was taken to Operating Room One, identified as Catherine Chang and the procedure verified as C-Section Delivery. A Time Out was held and the above information confirmed.  After induction of anesthesia by obtaining a spinal, the patient was prepped and draped in the usual sterile manner. A Pfannenstiel skin incision was made and carried down through the subcutaneous tissue to the underlying layer of fascia.  The fascia was incised bilaterally and extended transversely bilaterally with the Mayo scissors. Kocher clamps were placed on the inferior aspect of the fascial incision and the underlying rectus muscle was separated from the fascia. The same was done on the superior aspect of the fascial incision.  The peritoneum was identified, entered bluntly and extended manually.  An Alexis self-retaining retractor was placed.  The utero-vesical peritoneal reflection was incised transversely and the bladder flap was bluntly freed from the lower uterine segment. A low transverse uterine incision was made with the scalpel and extended bilaterally with the bandage scissors.  The infant was  delivered in frank presentation via breech extraction without difficulty.  After the umbilical cord was clamped and cut, the infant was handed to the awaiting pediatricians.  Cord blood was obtained for evaluation.  The placenta was removed intact and appeared to be within normal limits. The uterus was cleared of all clots and debris. The uterine incision was closed with running interlocking sutures of 0 Vicryl and a second imbricating layer was performed as well.   Bilateral tubes and ovaries appeared to be within normal limits.  Good hemostasis was noted.  Copious irrigation was performed until clear.  The peritoneum was repaired with 2-0 chromic via a running suture.  The fascia was reapproximated with a running suture of 0 Vicryl. The subcutaneous tissue was reapproximated with 3 interrupted sutures of 2-0 plain.  The skin was reapproximated with a subcuticular suture of 3-0 monocryl.  Steristrips were applied.  Instrument, sponge, and needle counts were correct prior to abdominal closure and at the conclusion of the case.  The patient was awaiting transfer to the recovery room in good condition.  Findings: Live female infant with Apgars 8 at one minute and 9 at five minutes.  Normal appearing bilateral ovaries and fallopian tubes were noted.  Estimated Blood Loss:  632 ml         Drains: foley to gravity 100 cc         Total IV Fluids: 1800 ml (1000cc prior to OR)         Specimens to Pathology: none         Complications:  None; patient tolerated the procedure well.         Disposition: PACU - hemodynamically stable.         Condition: stable  Attending Attestation: I performed the procedure.

## 2018-03-23 NOTE — Transfer of Care (Signed)
Immediate Anesthesia Transfer of Care Note  Patient: Catherine Chang  Procedure(s) Performed: PRIMARY CESAREAN SECTION (N/A )  Patient Location: PACU  Anesthesia Type:Spinal  Level of Consciousness: awake, alert  and oriented  Airway & Oxygen Therapy: Patient Spontanous Breathing  Post-op Assessment: Report given to RN and Post -op Vital signs reviewed and stable  Post vital signs: Reviewed, stable HR 72, RR 18, SaO2 100%, BP 107/77  Last Vitals:  Vitals Value Taken Time  BP    Temp    Pulse    Resp    SpO2      Last Pain:  Vitals:   03/22/18 1908  TempSrc:   PainSc: 0-No pain         Complications: No apparent anesthesia complications

## 2018-03-23 NOTE — Consult Note (Signed)
Neonatology Note:   Attendance at C-section:    I was asked by Dr. Mancel Bale to attend this primary C/S at 37 1/[redacted] weeks GA due to breech presentation. The mother is a G1P0 A pos, GBS neg with an otherwise uncomplicated pregnancy. ROM at delivery, fluid clear. Infant vigorous with good spontaneous cry and tone. Delayed cord clamping was not done. Needed only minimal bulb suctioning. Ap 8/9. Lungs clear to ausc in DR. 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 Mother is planning to breast feed, which will likely be the best way to feed the baby. Infant is able to remain with her mother for skin to skin time under nursing supervision. Transferred to the care of Pediatrician.   Real Cons, MD

## 2018-03-23 NOTE — Lactation Note (Addendum)
This note was copied from a baby's chart. Lactation Consultation Note:  Infant  27 hours old. Mother reports that infant has had a few sucks and was spoon fed with last feeding.   Assist mother with firming her nipple tissue and hand expression. Infant placed in football hold.  After several attempts infant latched on for a few strong tugs.   Infant placed in cross cradle hold and latched again for a few tugs.   Mother placed on left side and infant nuzzled and attempt to latch.  Allowed infant to suck on LC's gloved finger. Infant has strong sucking action.   Infant latched on for 10 mins.  Mother reports strong tugs . Observed a few swallows.   #20 nipple shield placed on Mothers right breast. Infant unable to get a good latch with the shield.  No noted transfer of colostrum in the shield.  Assist mother with hand expression. Infant was given .5 ml of ebm with a spoon.   Mother was given LPI parent handout and reviewed supplemental guidelines.   Mother having a lot of pain at present and was given pain meds. By staff nurse.  Mother to page for assistance when she is ready to pump. DEBP at bedside. Infant placed STS with mother and advised to attempt to feed infant with feeding cues and at least 8-12 times in 24 hours.  Mother receptive to all teaching.   Patient Name: Catherine Chang [id number] Date: 03/23/2018 Reason for consult: Follow-up assessment   Maternal Data    Feeding Feeding Type: Breast Milk Length of feed: 5 min  LATCH Score Latch: Grasps breast easily, tongue down, lips flanged, rhythmical sucking.  Audible Swallowing: None  Type of Nipple: Flat  Comfort (Breast/Nipple): Soft / non-tender  Hold (Positioning): Assistance needed to correctly position infant at breast and maintain latch.  LATCH Score: 6  Interventions Interventions: Assisted with latch;Skin to skin;Hand express;Breast compression;Adjust position;Support pillows;Position  options;Expressed milk  Lactation Tools Discussed/Used Tools: Nipple Shields Nipple shield size: 20   Consult Status Consult Status: Follow-up Date: 03/24/18 Follow-up type: In-patient    [name] [facility] 03/23/2018, 12:16 PM

## 2018-03-23 NOTE — Anesthesia Postprocedure Evaluation (Signed)
Anesthesia Post Note  Patient: MAVEN ROSANDER  Procedure(s) Performed: PRIMARY CESAREAN SECTION (N/A )     Patient location during evaluation: Mother Baby Anesthesia Type: Spinal Level of consciousness: awake and alert Pain management: pain level controlled Vital Signs Assessment: post-procedure vital signs reviewed and stable Respiratory status: spontaneous breathing, nonlabored ventilation and respiratory function stable Cardiovascular status: stable Postop Assessment: no headache, no backache, spinal receding, no apparent nausea or vomiting, patient able to bend at knees, adequate PO intake and able to ambulate Anesthetic complications: no    Last Vitals:  Vitals:   03/23/18 0612 03/23/18 0748  BP: 113/74 103/67  Pulse: 64 (!) 58  Resp: 18 18  Temp: 36.9 C 36.9 C  SpO2:  99%    Last Pain:  Vitals:   03/23/18 0748  TempSrc: Oral  PainSc: 7    Pain Goal:                 [employer]

## 2018-03-23 NOTE — Anesthesia Procedure Notes (Signed)
Spinal  Patient location during procedure: OR Start time: 03/23/2018 1:40 AM End time: 03/23/2018 1:44 AM Staffing Anesthesiologist: Lyn Hollingshead, MD Performed: anesthesiologist  Preanesthetic Checklist Completed: patient identified, site marked, surgical consent, pre-op evaluation, timeout performed, IV checked, risks and benefits discussed and monitors and equipment checked Spinal Block Patient position: sitting Prep: site prepped and draped and DuraPrep Patient monitoring: continuous pulse ox and blood pressure Location: L3-4 Injection technique: single-shot Needle Needle type: Pencan  Needle gauge: 24 G Needle length: 10 cm Needle insertion depth: 5 cm Assessment Sensory level: T4

## 2018-03-23 NOTE — Lactation Note (Addendum)
This note was copied from a baby's chart. Lactation Consultation Note Baby 1 hr old at time of consult. Baby fussy wanting to feed. Rooting trying to suckle on breast. Unable to maintain latch.  Mom has flat nipples, not very compressible. LC able to t-cup areola for baby to latch. Baby unable to maintain latch when breast not held. Baby has cleft palate. LC hasn't assessed palate. Mom has breast reduction. Mom states it was so long ago she can't remember what size or how much they took. Hand expression w/thick colostrum noted. Collected a few drops.  Gave colostrum to FOB in container, discussed spoon feeding, STS, Pumping, hand expression demonstrated, and supplementing d/t less than 6lbs. Fitted mom w/#16 NS. Baby latched well. Mom needed to firm breast w/"C" hold.  Baby on the breast for over 20 min. Probably suckled 5 min.  Discussed mom supplementing w/colostrum and or formula, pumping, and hand expressing. Mom agreed. Left #21 flanges. Shells and hand pump reviewed. Will ask RN to set up DEBP. Mom still in PACU. Patient Name: Catherine Chang [id number] Date: 03/23/2018 Reason for consult: Initial assessment;Infant < 6lbs;Early term 37-38.6wks;1st time breastfeeding;Breast reduction   Maternal Data Has patient been taught Hand Expression?: Yes Does the patient have breastfeeding experience prior to this delivery?: No  Feeding Feeding Type: Breast Fed Length of feed: 5 min(on breast longer, feeding off and on 5 min. still on breast.)  LATCH Score Latch: Repeated attempts needed to sustain latch, nipple held in mouth throughout feeding, stimulation needed to elicit sucking reflex.  Audible Swallowing: None  Type of Nipple: Flat  Comfort (Breast/Nipple): Soft / non-tender  Hold (Positioning): Assistance needed to correctly position infant at breast and maintain latch.  LATCH Score: 4  Interventions Interventions: Breast feeding basics reviewed;Support pillows;Assisted  with latch;Position options;Skin to skin;Breast massage;Hand express;Shells;Pre-pump if needed;Hand pump;DEBP;Breast compression;Adjust position  Lactation Tools Discussed/Used Tools: Shells;Pump;Nipple Shields Nipple shield size: 16;20 Shell Type: Inverted Breast pump type: Double-Electric Breast Pump;Manual WIC Program: No Pump Review: Milk Storage(asked RN to set up)   Consult Status Consult Status: Follow-up Date: 03/23/18 Follow-up type: In-patient    Theodoro Kalata 03/23/2018, 4:49 AM

## 2018-03-24 NOTE — Lactation Note (Signed)
This note was copied from a baby's chart. Lactation Consultation Note:  Arrived in room and mother has infant placed in football hold on the left breast.  Observed infant with intermittent suckling and a few swallows. Assist mother with breast compression.  Infant on and off for 20 mins. Observed mothers nipple round. Mother hand expresses large drops of colostrum.  Mother placed infant in football hold on the alternate breast. Observed infant latching but has a shallow latch.  Infant on and off with short burst of suckling and a few swallows.   Mother reports that she is pumping but nothing is coming out.  Mother advised to continue to  post pump after each feeding for 15-20 mins.   GMO[facility] has Dr Owens Shark bottle ready to feed infant.  Discussed supplemental guidelines.  Advised to offer infant at least 10 ml to 20 ml every 2-3 hours. Offered assistance with bottle feeding if needed. Mother will page for staff assistance if needed.  Mother encouraged to continue to do frequent STS. Feed infant with feeding cues and at least 8-12 times in 24 hours.  Mother receptive to all teaching.   Patient Name: Catherine Chang [id number] Date: 03/24/2018 Reason for consult: Follow-up assessment   Maternal Data    Feeding Feeding Type: Breast Fed Nipple Type: Dr. Roosvelt Harps Preemie Length of feed: 20 min  LATCH Score Latch: Repeated attempts needed to sustain latch, nipple held in mouth throughout feeding, stimulation needed to elicit sucking reflex.  Audible Swallowing: A few with stimulation  Type of Nipple: Flat  Comfort (Breast/Nipple): Soft / non-tender  Hold (Positioning): Assistance needed to correctly position infant at breast and maintain latch.  LATCH Score: 6  Interventions Interventions: Adjust position;Support pillows;Position options;Hand pump  Lactation Tools Discussed/Used     Consult Status Consult Status: Follow-up Date: 03/25/18 Follow-up type:  In-patient    [name] [facility] 03/24/2018, 12:13 PM

## 2018-03-24 NOTE — Lactation Note (Signed)
This note was copied from a baby's chart. Lactation Consultation Note  Patient Name: Girl Caidence Kaseman [id number] Date: 03/24/2018 Reason for consult: Early term 37-38.6wks;Follow-up assessment;Mother's request P1, 23 hr female infant,ETI , presence of cleft palate Mom is a [place] is being breastfeed and supplemented with Similac Neosure 22 kcal w/ iron. LC entered room, Mom wants assistance w/ feeding infant at breast. Mom used football hold w/out NS, infant latched on left breast, mom firmed breast in infant's mouth, infant took a  few sucks and swallows. Infant latch to breast. For 10 mins. Latch -7 due infant latching on and off breast. Mom plans to continue latching baby to breast and felt baby is improving w/ latching staying on breast longer duration and LC and mom, could hear a few audible swallows . Mom plans to latch baby to breast, hand express and give any EBM baby to baby , then infant w/ be supplemented with formula according ETI  age of hrs per ml. Mom encouraged to feed baby 8-12 times/24 hours and with feeding cues.  Reinforced I&O. Parents w/ look at [facility] four choices and let LC know which one they desire.  LC reinforced : LC O/P services, breastfeeding support groups, community resources, and our phone # for post-discharge questions.  Parents will call LC if they have any further questions or concerns. Maternal Data Formula Feeding for Exclusion: No Has patient been taught Hand Expression?: Yes Does the patient have breastfeeding experience prior to this delivery?: Yes(BF her daughter for 1 month but stopped due low milk supply.)  Feeding Feeding Type: Formula Nipple Type: Dr. Roosvelt Harps Preemie Length of feed: 20 min  LATCH Score Latch: Repeated attempts needed to sustain latch, nipple held in mouth throughout feeding, stimulation needed to elicit sucking reflex.  Audible Swallowing: Spontaneous and intermittent  Type of Nipple: Flat  Comfort  (Breast/Nipple): Soft / non-tender  Hold (Positioning): Assistance needed to correctly position infant at breast and maintain latch.  LATCH Score: 7  Interventions Interventions: Assisted with latch;Skin to skin;Adjust position;Support pillows;Pre-pump if needed  Lactation Tools Discussed/Used     Consult Status Consult Status: Follow-up Date: 03/24/18 Follow-up type: In-patient    Vicente Serene 03/24/2018, 2:55 AM

## 2018-03-24 NOTE — Progress Notes (Signed)
Subjective: Postpartum Day 1 Cesarean Delivery Patient reports incisional pain, tolerating PO, + flatus and no problems voiding.    Objective: Vital signs in last 24 hours: Temp:  [97.8 F (36.6 C)-98.5 F (36.9 C)] 98.3 F (36.8 C) (08/08 0515) Pulse Rate:  [61-72] 65 (08/08 0515) Resp:  [18] 18 (08/08 0515) BP: (101-109)/(63-73) 103/67 (08/08 0515) SpO2:  [98 %-100 %] 99 % (08/08 0515)  Physical Exam:  General: alert, cooperative and appears stated age Lochia: appropriate Uterine Fundus: firm Incision: CDI DVT Evaluation: No evidence of DVT seen on physical exam.  Recent Labs    03/22/18 2217 03/23/18 0737  HGB 12.5 11.2*  HCT 36.9 32.8*    Assessment/Plan: Status post Cesarean section. Doing well postoperatively.  Continue current care. Baby in room Has cleft palate; difficulty latching Bertram Gala Clemmons [name] 03/24/2018, 1:19 PM

## 2018-03-25 ENCOUNTER — Other Ambulatory Visit: Payer: Self-pay

## 2018-03-25 NOTE — Progress Notes (Signed)
Patient transferred to NICU, re: cleft palate and weight loss. Swallow study done before transfer.

## 2018-03-25 NOTE — Progress Notes (Signed)
Catherine Chang [id number] Postpartum Postoperative Day # 2  Catherine Chang, G1P1001, [redacted]w[redacted]d S/P Primary LT Cesarean Section due to 1.37 1/7wks 2.Breech Presentation 3.Labor 4.Bleeding.   Subjective: Patient up ad lib, denies syncope or dizziness. Reports consuming regular diet without issues and denies N/V. Patient reports 0 bowel movement + passing flatus.  Denies issues with urination and reports bleeding is "light."  Patient is breast and bottle feeding and reports going well.  Desires undecided for postpartum contraception.  Pain is being appropriately managed with use of motrin and oxy IR..Marland Kitchen   Objective: Patient Vitals for the past 24 hrs:  BP Temp Temp src Pulse Resp SpO2  03/25/18 0646 118/66 97.6 F (36.4 C) Oral 79 17 -  03/24/18 2141 116/72 97.9 F (36.6 C) Oral 63 14 98 %  03/24/18 1433 106/74 97.7 F (36.5 C) Oral 62 16 98 %     Physical Exam:  General: alert, cooperative and appears stated age Mood/Affect: Happy Lungs: clear to auscultation, no wheezes, rales or rhonchi, symmetric air entry.  Heart: normal rate, regular rhythm, normal S1, S2, no murmurs, rubs, clicks or gallops. Breast: breasts appear normal, no suspicious masses, no skin or nipple changes or axillary nodes. Abdomen:  + bowel sounds, soft, non-tender Incision: healing well, no significant drainage, no dehiscence, no significant erythema, Honeycomb dressing  Uterine Fundus: firm, involution -2 Lochia: appropriate Skin: Warm, Dry. DVT Evaluation: No evidence of DVT seen on physical exam. Negative Homan's sign. No cords or calf tenderness. No significant calf/ankle edema.  Labs: Recent Labs    03/22/18 2217 03/23/18 0737  HGB 12.5 11.2*  HCT 36.9 32.8*  WBC 14.6* 17.5*    CBG (last 3)  No results for input(s): GLUCAP in the last 72 hours.   I/O: No intake/output data recorded.   Assessment Postpartum Postoperative Day # 3. [name], G1P1001, 365w1dS/P primary LT Cesarean  Section due to 1.37 1/7wks 2.Breech Presentation 3.Labor 4.Bleeding.  Pt stable. -2 Involution. Bottle and breast Feeding. Hemodynamically Stable.  Plan: Continue other mgmt as ordered VTE Prophylactics: SCD, ambulated as tolerates.  Pain control: Motrin/Tylenol/Narcotics PRN BC undecided Plan for discharge tomorrow, Breastfeeding and Lactation consult  Dr. RoNyoka Cowdeno be updated on patient status  Catherine Delillo NP-C, [name] 03/25/2018, 1:49 PM

## 2018-03-26 ENCOUNTER — Ambulatory Visit: Payer: Self-pay

## 2018-03-26 MED ORDER — OXYCODONE HCL 5 MG PO TABS: 5.0000 mg | ORAL_TABLET | ORAL | 0 refills | Status: DC | PRN

## 2018-03-26 MED ORDER — IBUPROFEN 600 MG PO TABS: 600.0000 mg | ORAL_TABLET | Freq: Four times a day (QID) | ORAL | 0 refills | Status: DC

## 2018-03-26 NOTE — Discharge Summary (Signed)
OB Discharge Summary     Patient Name: Catherine Chang DOB: 31-Oct-1990 MRN: [id number]  Date of admission: 03/22/2018 Delivering MD: Everett Graff   Date of discharge: 03/26/2018  Admitting diagnosis: REFERRED BY DOCTOR Intrauterine pregnancy: [redacted]w[redacted]d    Secondary diagnosis:  Active Problems:   Delivery of pregnancy by cesarean section   S/P primary low transverse C-section  Additional problems:      Discharge diagnosis: Term Pregnancy Delivered                                                                                                Post partum procedures:  Augmentation:   Complications: Breech with vaginal bleeding  Hospital course:  Onset of Labor With Unplanned C/S  27y.o. yo G1P1001 at 355w1das admitted in Latent Labor on 03/22/2018. Patient had a labor course significant for Breech presentaiton and vaginal bleeding. Membrane Rupture Time/Date: 2:06 AM ,03/23/2018   The patient went for cesarean section due to Malpresentation, and delivered a Viable infant,03/23/2018  Details of operation can be found in separate operative note. Patient had an uncomplicated postpartum course.  She is ambulating,tolerating a regular diet, passing flatus, and urinating well.  Patient is discharged home in stable condition 03/26/18.  Physical exam  Vitals:   03/25/18 0646 03/25/18 1448 03/25/18 2119 03/26/18 0609  BP: 118/66 113/87 119/79 125/67  Pulse: 79 62 65 (!) 55  Resp: 17 17 18    Temp: 97.6 F (36.4 C) 97.8 F (36.6 C) 98.3 F (36.8 C) 98.4 F (36.9 C)  TempSrc: Oral Oral Oral   SpO2:  100% 100% 100%  Weight:       General: alert, cooperative and no distress Lochia: appropriate Uterine Fundus: firm Incision: Dressing is clean, dry, and intact DVT Evaluation: No evidence of DVT seen on physical exam. Negative Homan's sign. Labs: Lab Results  Component Value Date   WBC 17.5 (H) 03/23/2018   HGB 11.2 (L) 03/23/2018   HCT 32.8 (L) 03/23/2018   MCV 90.9 03/23/2018   PLT 198 03/23/2018   No flowsheet data found.  Discharge instruction: per After Visit Summary and "Baby and Me Booklet".  After visit meds:  Allergies as of 03/26/2018   No Known Allergies     Medication List    STOP taking these medications   loratadine 10 MG tablet Commonly known as:  CLARITIN     TAKE these medications   buPROPion 150 MG 12 hr tablet Commonly known as:  WELLBUTRIN SR Take 150 mg by mouth 2 (two) times daily.   fluticasone 50 MCG/ACT nasal spray Commonly known as:  FLONASE Place 1 spray into both nostrils daily as needed for allergies or rhinitis.   FUSION PLUS Caps Take 1 tablet by mouth daily.   ibuprofen 600 MG tablet Commonly known as:  ADVIL,MOTRIN Take 1 tablet (600 mg total) by mouth every 6 (six) hours.   oxyCODONE 5 MG immediate release tablet Commonly known as:  Oxy IR/ROXICODONE Take 1 tablet (5 mg total) by mouth every 4 (four) hours as needed (pain scale 4-7).  Diet: routine diet  Activity: Advance as tolerated. Pelvic rest for 6 weeks.   Outpatient follow up:2 weeks Follow up Appt:No future appointments. Follow up Visit:No follow-ups on file.  Postpartum contraception: Undecided  Newborn Data: Live born female  Birth Weight: 5 lb 8.2 oz (2500 g) APGAR: 8, 9  Newborn Delivery   Birth date/time:  03/23/2018 02:07:00 Delivery type:  C-Section, Low Transverse Trial of labor:  No C-section categorization:  Primary     Baby Feeding: Breast Disposition:home with mother   03/26/2018 Larey Days, [name]

## 2018-03-26 NOTE — Discharge Instructions (Signed)
Postpartum Care After Cesarean Delivery The period of time right after you deliver your newborn is called the postpartum period. What kind of medical care will I receive?  You may continue to receive fluids and medicines through an IV tube inserted into one of your veins.  You may have small, flexible tube (catheter) draining urine from your bladder into a bag outside of your body. The catheter will be removed as soon as possible.  You may be given a squirt bottle to use when you go to the bathroom. You may use this until you are comfortable wiping as usual. To use the squirt bottle, follow these steps: ? Before you urinate, fill the squirt bottle with warm water. The water should be warm. Do not use hot water. ? After you urinate, while you are sitting on the toilet, use the squirt bottle to rinse the area around your urethra and vaginal opening. This rinses away any urine and blood. ? You may do this instead of wiping. As you start healing, you may use the squirt bottle before wiping yourself. Make sure to wipe gently. ? Fill the squirt bottle with clean water every time you use the bathroom.  You will be given sanitary pads to wear.  Your incision will be monitored to make sure it is healing properly. You will be told when it is safe for your stitches, staples, or skin adhesive tape to be removed. What can I expect?  You may not feel the need to urinate for several hours after delivery.  You will have some soreness and pain in your abdomen. You may have a small amount of blood or clear fluid coming from your incision.  If you are breastfeeding, you may have uterine contractions every time you breastfeed for up to several weeks postpartum. Uterine contractions help your uterus return to its normal size.  It is normal to have vaginal bleeding (lochia) after delivery. The amount and appearance of lochia is often similar to a menstrual period in the first week after delivery. It will  gradually decrease over the next few weeks to a dry, yellow-brown discharge. For most women, lochia stops completely by 6-8 weeks after delivery. Vaginal bleeding can vary from woman to woman.  Within the first few days after delivery, you may have breast engorgement. This is when your breasts feel heavy, full, and uncomfortable. Your breasts may also throb and feel hard, tightly stretched, warm, and tender. After this occurs, you may have milk leaking from your breasts.Your health care provider can help you relieve discomfort due to breast engorgement. Breast engorgement should go away within a few days.  You may feel more sad or worried than normal due to hormonal changes after delivery. These feelings should not last more than a few days. If these feelings do not go away after several days, speak with your health care provider. How should I care for myself?  Tell your health care provider if you have pain or discomfort.  Drink enough water to keep your urine clear or pale yellow.  Wash your hands thoroughly with soap and water for at least 20 seconds after changing your sanitary pads or using the toilet, and before holding or feeding your baby.  If you are not breastfeeding, avoid touching your breasts a lot. Doing this can make your breasts produce more milk.  If you become weak or lightheaded, or you feel like you might faint, ask for help before: ? Getting out of bed. ? Showering.  Change your sanitary pads frequently. Watch for any changes in your flow, such as a sudden increase in volume, a change in color, or the passing of large blood clots. If you pass a blood clot from your vagina, save it to show to your health care provider. Do not flush blood clots down the toilet without having your health care provider look at them.  Make sure that all your vaccinations are up to date. This can help protect you and your baby from getting certain diseases. You may need to have immunizations done  before you leave the hospital.  If desired, talk with your health care provider about methods of family planning or birth control (contraception). How can I start bonding with my baby? Spending as much time as possible with your baby is very important. During this time, you and your baby can get to know each other and develop a bond. Having your baby stay with you in your room (rooming in) can give you time to get to know your baby. Rooming in can also help you become comfortable caring for your baby. Breastfeeding can also help you bond with your baby. How can I plan for returning home with my baby?  Make sure that you have a car seat installed in your vehicle. ? Your car seat should be checked by a certified car seat installer to make sure that it is installed safely. ? Make sure that your baby fits into the car seat safely.  Ask your health care provider any questions you have about caring for yourself or your baby. Make sure that you are able to contact your health care provider with any questions after leaving the hospital. This information is not intended to replace advice given to you by your health care provider. Make sure you discuss any questions you have with your health care provider. Document Released: 04/27/2012 Document Revised: 01/06/2016 Document Reviewed: 07/08/2015 Elsevier Interactive Patient Education  2018 [employer] Instructions for Mom ACTIVITY  Gradually return to your regular activities.  Let yourself rest. Nap while your baby sleeps.  Avoid lifting anything that is heavier than 10 lb (4.5 kg) until your health care provider says it is okay.  Avoid activities that take a lot of effort and energy (are strenuous) until approved by your health care provider. Walking at a slow-to-moderate pace is usually safe.  If you had a cesarean delivery: ? Do not vacuum, climb stairs, or drive a car for 4-6 weeks. ? Have someone help you at home until you feel like  you can do your usual activities yourself. ? Do exercises as told by your health care provider, if this applies.  VAGINAL BLEEDING You may continue to bleed for 4-6 weeks after delivery. Over time, the amount of blood usually decreases and the color of the blood usually gets lighter. However, the flow of bright red blood may increase if you have been too active. If you need to use more than one pad in an hour because your pad gets soaked, or if you pass a large clot:  Lie down.  Raise your feet.  Place a cold compress on your lower abdomen.  Rest.  Call your health care provider.  If you are breastfeeding, your period should return anytime between 8 weeks after delivery and the time that you stop breastfeeding. If you are not breastfeeding, your period should return 6-8 weeks after delivery. PERINEAL CARE The perineal area, or perineum, is the part of your body  between your thighs. After delivery, this area needs special care. Follow these instructions as told by your health care provider.  Take warm tub baths for 15-20 minutes.  Use medicated pads and pain-relieving sprays and creams as told.  Do not use tampons or douches until vaginal bleeding has stopped.  Each time you go to the bathroom: ? Use a peri bottle. ? Change your pad. ? Use towelettes in place of toilet paper until your stitches have healed.  Do Kegel exercises every day. Kegel exercises help to maintain the muscles that support the vagina, bladder, and bowels. You can do these exercises while you are standing, sitting, or lying down. To do Kegel exercises: ? Tighten the muscles of your abdomen and the muscles that surround your birth canal. ? Hold for a few seconds. ? Relax. ? Repeat until you have done this 5 times in a row.  To prevent hemorrhoids from developing or getting worse: ? Drink enough fluid to keep your urine clear or pale yellow. ? Avoid straining when having a bowel movement. ? Take  over-the-counter medicines and stool softeners as told by your health care provider.  BREAST CARE  Wear a tight-fitting bra.  Avoid taking over-the-counter pain medicine for breast discomfort.  Apply ice to the breasts to help with discomfort as needed: ? Put ice in a plastic bag. ? Place a towel between your skin and the bag. ? Leave the ice on for 20 minutes or as told by your health care provider.  NUTRITION  Eat a well-balanced diet.  Do not try to lose weight quickly by cutting back on calories.  Take your prenatal vitamins until your postpartum checkup or until your health care provider tells you to stop.  POSTPARTUM DEPRESSION You may find yourself crying for no apparent reason and unable to cope with all of the changes that come with having a newborn. This mood is called postpartum depression. Postpartum depression happens because your hormone levels change after delivery. If you have postpartum depression, get support from your partner, friends, and family. If the depression does not go away on its own after several weeks, contact your health care provider. BREAST SELF-EXAM Do a breast self-exam each month, at the same time of the month. If you are breastfeeding, check your breasts just after a feeding, when your breasts are less full. If you are breastfeeding and your period has started, check your breasts on day 5, 6, or 7 of your period. Report any lumps, bumps, or discharge to your health care provider. Know that breasts are normally lumpy if you are breastfeeding. This is temporary, and it is not a health risk. INTIMACY AND SEXUALITY Avoid sexual activity for at least 3-4 weeks after delivery or until the brownish-red vaginal flow is completely gone. If you want to avoid pregnancy, use some form of birth control. You can get pregnant after delivery, even if you have not had your period. SEEK MEDICAL CARE IF:  You feel unable to cope with the changes that a child brings to  your life, and these feelings do not go away after several weeks.  You notice a lump, a bump, or discharge on your breast.  SEEK IMMEDIATE MEDICAL CARE IF:  Blood soaks your pad in 1 hour or less.  You have: ? Severe pain or cramping in your lower abdomen. ? A bad-smelling vaginal discharge. ? A fever that is not controlled by medicine. ? A fever, and an area of your breast is red  and sore. ? Pain or redness in your calf. ? Sudden, severe chest pain. ? Shortness of breath. ? Painful or bloody urination. ? Problems with your vision.  You vomit for 12 hours or longer.  You develop a severe headache.  You have serious thoughts about hurting yourself, your child, or anyone else.  This information is not intended to replace advice given to you by your health care provider. Make sure you discuss any questions you have with your health care provider. Document Released: 07/31/2000 Document Revised: 01/09/2016 Document Reviewed: 02/04/2015 Elsevier Interactive Patient Education  2017 [employer].

## 2018-03-26 NOTE — Lactation Note (Signed)
This note was copied from a baby's chart. Lactation Consultation Note  Patient Name: Catherine Chang [id number] Date: 03/26/2018 Reason for consult: Follow-up assessment;NICU baby;Infant weight loss;Other (Comment);Early term 50-38.6wks;Breast reduction(cleft palate, baby in NICU )  Breast surgery in 2006.  Per mom has been pumping with the DEBP and finally got results - 3-4 ml.  Mom , dad ready for D/C,and plan to stop back in NICU and take the milk up for baby.  LC provided yellow stickers for the 1st 24 pumps, extra storage bottles, and colostrum containers.  Mom denies soreness, sore nipple and engorgement prevention and tx reviewed.  [place] instructed mom on the use of her [place] and also recommended when mom is up to visit  Baby to plan on pumping in the NICU pumping rooms with the hospital DEBP .  LC also encouraged STS in NICU to enhance milk supply and let down, and latching after her tube / gavage feeding.  Mother informed of post-discharge support and given phone number to the lactation department, including services for phone call assistance; out-patient appointments; and breastfeeding support group. List of other breastfeeding resources in the community given in the handout. Encouraged mother to call for problems or concerns related to breastfeeding.   Maternal Data Has patient been taught Hand Expression?: Yes(per mom feels comfortable , enc prior to pumping and after pumping )  Feeding Feeding Type: Formula  LATCH Score                   Interventions Interventions: Breast feeding basics reviewed  Lactation Tools Discussed/Used Tools: Pump Breast pump type: Double-Electric Breast Pump Pump Review: Milk Storage   Consult Status Consult Status: PRN Date: (baby in NICU ) Follow-up type: Other (comment)    Jerlyn Ly Ranen Doolin 03/26/2018, 12:40 PM

## 2018-03-28 MED FILL — BUPROPION SR 150 MG TABLET: 150 | 30 days supply | Qty: 60 | Fill #3

## 2018-03-28 MED FILL — FUSION PLUS CAPSULE: 30 days supply | Qty: 30 | Fill #1

## 2018-03-29 ENCOUNTER — Ambulatory Visit: Payer: Self-pay

## 2018-03-29 NOTE — Lactation Note (Signed)
This note was copied from a baby's chart. Lactation Consultation Note  Patient Name: Catherine Chang [id number] Date: 03/29/2018    I spoke with Mom about her expectations for breastfeeding. I informed family that infants with cleft palates are unable to transfer milk at the breast in the absence of assertive supplementation at the breast. The option of supplementing at the breast was discussed with Mom, but she chose not to do so. Instead, Mom will offer the breast as desired, knowing that any latching/suckling is not for nutrition, but, rather, for comfort and bonding.  Mom tends to pump q3 hrs, but has taken longer breaks between pumping to ensure adequate sleep, etc. The most milk she has expressed so far is 77m/session. Mom has a history of anxiety & depression & is well-controlled on bupropion. Bupropion's dopamine agonist activity has the potential to decrease her supply. In light of the fact that uninterrupted sleep is protective against PPMD, I recommended that Mom not awaken to pump in the middle of the night & just pump during the day. To maximize her yield when pumping, I encouraged her to do hand expression after pumping. Mom reports feeling comfortable with pumping & her hand expression technique. I also discussed looking up Dr JAinsley Spinnervideo about "hands-on pumping."   Mom reports + breast changes w/pregnancy. She did have a breast reduction in 2006. The [facility] reports that the surgeon did not separate her nipple-areolar complex from the breast tissue during surgery.   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    RMatthias HughsHUpmc Pinnacle Lancaster8/13/2019, 8:17 PM

## 2018-04-05 ENCOUNTER — Encounter (HOSPITAL_COMMUNITY)
Admission: RE | Admit: 2018-04-05 | Discharge: 2018-04-05 | Disposition: A | Discharge status: Home / Self Care | Payer: 59 | Source: Ambulatory Visit

## 2018-04-06 ENCOUNTER — Inpatient Hospital Stay (HOSPITAL_COMMUNITY): Admit: 2018-04-06 | Payer: 59 | Admitting: Obstetrics & Gynecology

## 2018-04-26 MED FILL — BUPROPION SR 150 MG TABLET: 150 | 30 days supply | Qty: 60 | Fill #4

## 2018-05-05 DIAGNOSIS — G43909 Migraine, unspecified, not intractable, without status migrainosus: Secondary | ICD-10-CM

## 2018-05-05 DIAGNOSIS — Z7251 High risk heterosexual behavior: Secondary | ICD-10-CM | POA: Diagnosis not present

## 2018-05-05 DIAGNOSIS — Z3040 Encounter for surveillance of contraceptives, unspecified: Secondary | ICD-10-CM | POA: Diagnosis not present

## 2018-05-05 HISTORY — DX: Migraine, unspecified, not intractable, without status migrainosus: G43.909

## 2018-05-05 MED FILL — SLYND 4 MG TABS: 4 | 84 days supply | Qty: 84 | Fill #0

## 2018-05-27 MED FILL — BUPROPION SR 150 MG TABLET: 150 | 30 days supply | Qty: 60 | Fill #5

## 2018-06-14 DIAGNOSIS — F988 Other specified behavioral and emotional disorders with onset usually occurring in childhood and adolescence: Secondary | ICD-10-CM | POA: Diagnosis not present

## 2018-06-23 MED FILL — BUPROPION SR 150 MG TABLET: 150 | 30 days supply | Qty: 60 | Fill #6

## 2018-07-15 MED FILL — AMPHETAMINE-DEXTROAMPHETAMI: 10 | 30 days supply | Qty: 60 | Fill #0

## 2018-07-25 MED FILL — SLYND 4 MG TABS: 4 | 84 days supply | Qty: 84 | Fill #1

## 2018-08-01 MED FILL — BUPROPION SR 150 MG TABLET: 150 | 30 days supply | Qty: 60 | Fill #0

## 2018-08-15 MED FILL — AMPHETAMINE-DEXTROAMPHETAMI: 10 | 30 days supply | Qty: 60 | Fill #0

## 2018-09-01 MED FILL — BUPROPION SR 150 MG TABLET: 150 | 30 days supply | Qty: 60 | Fill #1

## 2018-09-15 MED FILL — AMPHETAMINE-DEXTROAMPHETAMI: 10 | 30 days supply | Qty: 60 | Fill #0

## 2018-10-03 MED FILL — BUPROPION SR 150 MG TABLET: 150 | 30 days supply | Qty: 60 | Fill #2 | Status: TO

## 2018-10-14 MED FILL — AMPHETAMINE-DEXTROAMPHETAMI: 10 | 30 days supply | Qty: 60 | Fill #0

## 2018-11-16 MED FILL — BUPROPION HCL SR 150 MG TAB: 150 | 30 days supply | Qty: 60 | Fill #0

## 2018-12-10 MED FILL — BUPROPION HCL SR 150 MG TAB: 150 | 30 days supply | Qty: 60 | Fill #1

## 2018-12-12 MED FILL — AMPHETAMINE-DEXTROAMPHETAMI: 10 | 30 days supply | Qty: 60 | Fill #0

## 2018-12-20 MED FILL — predniSONE 10 MG (21) TBPK: 10 | 6 days supply | Qty: 21 | Fill #0

## 2019-01-10 MED FILL — AMPHETAMINE-DEXTROAMPHETAMI: 10 | 30 days supply | Qty: 60 | Fill #0

## 2019-01-10 MED FILL — BUPROPION HCL SR 150 MG TAB: 150 | 30 days supply | Qty: 60 | Fill #2

## 2019-02-08 MED FILL — AMPHETAMINE-DEXTROAMPHETAMI: 10 | 30 days supply | Qty: 60 | Fill #0

## 2019-02-08 MED FILL — BUPROPION HCL SR 150 MG TAB: 150 | 30 days supply | Qty: 60 | Fill #3

## 2019-03-07 MED FILL — AMPHETAMINE-DEXTROAMPHETAMI: 10 | 30 days supply | Qty: 60 | Fill #0

## 2019-03-10 MED FILL — BUPROPION HCL SR 150 MG TAB: 150 | 30 days supply | Qty: 60 | Fill #0

## 2019-03-14 ENCOUNTER — Other Ambulatory Visit (HOSPITAL_COMMUNITY): Payer: Self-pay | Admitting: Internal Medicine

## 2019-03-14 ENCOUNTER — Other Ambulatory Visit: Payer: Self-pay | Admitting: Internal Medicine

## 2019-03-14 DIAGNOSIS — R1011 Right upper quadrant pain: Secondary | ICD-10-CM

## 2019-03-15 ENCOUNTER — Other Ambulatory Visit: Payer: Self-pay

## 2019-03-15 ENCOUNTER — Other Ambulatory Visit (INDEPENDENT_AMBULATORY_CARE_PROVIDER_SITE_OTHER): Payer: 59

## 2019-03-15 ENCOUNTER — Other Ambulatory Visit: Payer: Self-pay | Admitting: Internal Medicine

## 2019-03-15 DIAGNOSIS — R112 Nausea with vomiting, unspecified: Secondary | ICD-10-CM

## 2019-03-16 ENCOUNTER — Ambulatory Visit: Payer: 59 | Admitting: Internal Medicine

## 2019-03-16 ENCOUNTER — Encounter: Payer: Self-pay | Admitting: Internal Medicine

## 2019-03-16 ENCOUNTER — Other Ambulatory Visit: Payer: Self-pay

## 2019-03-16 DIAGNOSIS — R1011 Right upper quadrant pain: Secondary | ICD-10-CM | POA: Diagnosis not present

## 2019-03-16 DIAGNOSIS — F909 Attention-deficit hyperactivity disorder, unspecified type: Secondary | ICD-10-CM | POA: Diagnosis not present

## 2019-03-16 DIAGNOSIS — F339 Major depressive disorder, recurrent, unspecified: Secondary | ICD-10-CM

## 2019-03-16 DIAGNOSIS — Z79899 Other long term (current) drug therapy: Secondary | ICD-10-CM | POA: Diagnosis not present

## 2019-03-16 DIAGNOSIS — R112 Nausea with vomiting, unspecified: Secondary | ICD-10-CM

## 2019-03-16 DIAGNOSIS — Z0000 Encounter for general adult medical examination without abnormal findings: Secondary | ICD-10-CM

## 2019-03-16 DIAGNOSIS — F329 Major depressive disorder, single episode, unspecified: Secondary | ICD-10-CM

## 2019-03-16 DIAGNOSIS — F32A Depression, unspecified: Secondary | ICD-10-CM

## 2019-03-16 LAB — CMP14 + ANION GAP
ALT: 14 IU/L (ref 0–32)
AST: 17 IU/L (ref 0–40)
Albumin/Globulin Ratio: 2.3 — ABNORMAL HIGH (ref 1.2–2.2)
Albumin: 4.4 g/dL (ref 3.9–5.0)
Alkaline Phosphatase: 79 IU/L (ref 39–117)
Anion Gap: 22 mmol/L — ABNORMAL HIGH (ref 10.0–18.0)
BUN/Creatinine Ratio: 10 (ref 9–23)
BUN: 8 mg/dL (ref 6–20)
Bilirubin Total: <0.2 mg/dL (ref 0.0–1.2)
CO2: 17 mmol/L — ABNORMAL LOW (ref 20–29)
Calcium: 9.6 mg/dL (ref 8.7–10.2)
Chloride: 102 mmol/L (ref 96–106)
Creatinine, Ser: 0.77 mg/dL (ref 0.57–1.00)
GFR calc Af Amer: 121 mL/min/{1.73_m2} (ref >59)
GFR calc non Af Amer: 105 mL/min/{1.73_m2} (ref >59)
Globulin, Total: 1.9 g/dL (ref 1.5–4.5)
Glucose: 106 mg/dL — ABNORMAL HIGH (ref 65–99)
Potassium: 4.4 mmol/L (ref 3.5–5.2)
Sodium: 141 mmol/L (ref 134–144)
Total Protein: 6.3 g/dL (ref 6.0–8.5)

## 2019-03-16 LAB — CBC WITH DIFFERENTIAL/PLATELET
Basophils Absolute: 0 10*3/uL (ref 0.0–0.2)
Basos: 1 %
EOS (ABSOLUTE): 0.1 10*3/uL (ref 0.0–0.4)
Eos: 1 %
Hematocrit: 38.6 % (ref 34.0–46.6)
Hemoglobin: 13.3 g/dL (ref 11.1–15.9)
Immature Grans (Abs): 0 10*3/uL (ref 0.0–0.1)
Immature Granulocytes: 0 %
Lymphocytes Absolute: 2.5 10*3/uL (ref 0.7–3.1)
Lymphs: 33 %
MCH: 31.7 pg (ref 26.6–33.0)
MCHC: 34.5 g/dL (ref 31.5–35.7)
MCV: 92 fL (ref 79–97)
Monocytes Absolute: 0.5 10*3/uL (ref 0.1–0.9)
Monocytes: 7 %
Neutrophils Absolute: 4.4 10*3/uL (ref 1.4–7.0)
Neutrophils: 58 %
Platelets: 357 10*3/uL (ref 150–450)
RBC: 4.19 x10E6/uL (ref 3.77–5.28)
RDW: 12.2 % (ref 11.7–15.4)
WBC: 7.5 10*3/uL (ref 3.4–10.8)

## 2019-03-16 LAB — LIPASE: Lipase: 29 U/L (ref 14–72)

## 2019-03-16 LAB — HCG, SERUM, QUALITATIVE: hCG,Beta Subunit,Qual,Serum: NEGATIVE m[IU]/mL (ref <6)

## 2019-03-16 NOTE — Assessment & Plan Note (Signed)
We reviewed healthcare maintenance recommendations.  Most notably she is due for Pap smear.  She follows with her OB/GYN for this and she is overdue for an appointment which she is delayed due to the coronavirus pandemic.  She plans to make an upcoming appointment to have her Pap smear completed.

## 2019-03-16 NOTE — Assessment & Plan Note (Signed)
HPI: She has a previous diagnosis of ADHD.  She has been following with her previous PCP for this.  Over the last several years she is intermittently taking Adderall which helps with focus.  She is currently taking 10 mg twice daily which allows her to focus and studying for her boards.  She plans to try to stop this after she takes the test.  A:ADHD  P: Continue Adderall 10 mg twice daily, will follow to assess for ongoing need of medication overall no significant side effects doing well.

## 2019-03-16 NOTE — Assessment & Plan Note (Signed)
HPI: About 5 days ago she had an episode of severe right upper quadrant abdominal pain it was associated with nausea and vomiting.  Symptoms lasted over an hour and she debated going to the emergency department however they subsequently subsided.  [place] lab work and a right upper quadrant ultrasound.  She has scheduled a right upper quadrant ultrasound for Monday however not sure if she is going to obtain it given almost complete resolution of symptoms.  However yesterday she did have much milder episode of nausea.  She has not had significant right upper quadrant pain however does have some mild pain superior and lateral to her umbilicus on the right side.  Assessment non-intractable vomiting nausea not currently present  Plan I did obtain a CBC CMP lipase and beta-hCG yesterday.  I reviewed these labs with Chrys Racer today.  They are grossly unremarkable she does have a mild anion gap metabolic acidosis I suspect is due to decreased appetite/intake.  We did briefly conduct a point-of-care ultrasound of her right upper quadrant.  Gallbladder difficult to visualize from anterior view however posteriorly appeared grossly unremarkable no pericholecystic fluid, gallbladder wall appeared intact no obvious cholelithiasis. Discussed to be reasonable for her to follow through with the right upper quadrant ultrasound if she so wishes otherwise we could monitor clinically and obtain if symptoms return.

## 2019-03-16 NOTE — Assessment & Plan Note (Signed)
HPI: She reports to me that she has a history of depression.  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  This is well controlled no current side effects.  Assessment depression NOS  Plan Continue wellbutrin

## 2019-03-16 NOTE — Progress Notes (Signed)
  Subjective:  HPI: [name] is a 28 y.o. female who presents for for a preventative care appt to establish care and understands that acute and chronic medical issues will be addressed at another appt. The following items have been assessed and recorded in the appropriate area in the EMR.   Height, weight, BMI, and BP Visual acuity Depression screen Fall risk / safety level Advance directive discussion Preventative care services discussion  Medical and family history were reviewed and updated Updating list of other providers Medication reconciliation Personalized health advice, risky behaviors, and treatment advice Cognitive screen  Please see the assessment and plan for further information.   Review of Systems: Review of Systems  Constitutional: Negative for chills, fever, malaise/fatigue and weight loss.  Eyes: Negative for blurred vision.  Respiratory: Negative for cough.   Cardiovascular: Negative for chest pain.  Gastrointestinal: Positive for abdominal pain, nausea and vomiting. Negative for constipation and diarrhea.  Genitourinary: Negative for dysuria.  Neurological: Negative for dizziness.    Objective:  Physical Exam: Vitals:   03/16/19 1050  BP: 125/85  Pulse: 88  Temp: 98.8 F (37.1 C)  TempSrc: Oral  SpO2: 100%  Weight: 156 lb 11.2 oz (71.1 kg)  Height: 5' 2"  (1.575 m)   Body mass index is 28.66 kg/m. Physical Exam Vitals signs and nursing note reviewed.  Constitutional:      Appearance: Normal appearance.  Abdominal:     General: Abdomen is flat. There is no distension.     Palpations: Abdomen is soft. There is no mass.     Tenderness: There is abdominal tenderness (mild tenderness about 2in superior and lateral to the umbilcus, no mass or discret finding appreciated). There is no guarding.  Neurological:     Mental Status: She is alert and oriented to person, place, and time.  Psychiatric:        Mood and Affect: Mood normal.     Assessment & Plan:  See Encounters Tab for problem based charting.  Medications Ordered No orders of the defined types were placed in this encounter.  Other Orders No orders of the defined types were placed in this encounter.  Follow Up: Return in about 1 year (around 03/15/2020), or if symptoms worsen or fail to improve.

## 2019-03-20 ENCOUNTER — Encounter (HOSPITAL_COMMUNITY): Payer: Self-pay

## 2019-03-20 ENCOUNTER — Ambulatory Visit (HOSPITAL_COMMUNITY): Admission: RE | Admit: 2019-03-20 | Payer: 59 | Source: Ambulatory Visit

## 2019-04-03 MED FILL — BUPROPION HCL SR 150 MG TAB: 150 | 30 days supply | Qty: 60 | Fill #1

## 2019-04-06 ENCOUNTER — Other Ambulatory Visit: Payer: Self-pay | Admitting: Internal Medicine

## 2019-04-06 MED ORDER — AMPHETAMINE-DEXTROAMPHETAMINE 10 MG PO TABS: 10.0000 mg | ORAL_TABLET | Freq: Two times a day (BID) | ORAL | 0 refills | Status: DC

## 2019-04-06 MED FILL — AMPHETAMINE-DEXTROAMPHETAMI: 10 | 30 days supply | Qty: 60 | Fill #0

## 2019-04-06 NOTE — Progress Notes (Signed)
Provided 2 month refill of adderall for ADHD.

## 2019-05-03 MED FILL — BUPROPION HCL SR 150 MG TAB: 150 | 30 days supply | Qty: 60 | Fill #2

## 2019-05-08 MED FILL — PERMETHRIN 5 % CREA: 5 | 1 days supply | Qty: 60 | Fill #0

## 2019-06-02 MED FILL — BUPROPION HCL SR 150 MG TAB: 150 | 30 days supply | Qty: 60 | Fill #3

## 2019-06-20 ENCOUNTER — Telehealth: Payer: Self-pay | Admitting: Internal Medicine

## 2019-06-20 MED ORDER — BUPROPION HCL ER (XL) 300 MG PO TB24: 300.0000 mg | ORAL_TABLET | Freq: Every day | ORAL | 3 refills | Status: DC

## 2019-06-20 MED ORDER — TRAZODONE HCL 100 MG PO TABS: 100.0000 mg | ORAL_TABLET | Freq: Every day | ORAL | 1 refills | Status: DC

## 2019-06-20 MED FILL — traZODone HCL 100 MG TABS: 100 | 90 days supply | Qty: 90 | Fill #0

## 2019-06-20 MED FILL — buPROPion HCL ER (XL) 300 M: 300 | 90 days supply | Qty: 90 | Fill #0

## 2019-06-20 NOTE — Telephone Encounter (Signed)
Spoke with Catherine Chang, things are going well. Off adderall and doing well with ADHD, having some trouble sleeping and would like to try trazadone to see if it helps.  Would like change to once daily wellburtin.

## 2019-07-06 DIAGNOSIS — Z01419 Encounter for gynecological examination (general) (routine) without abnormal findings: Secondary | ICD-10-CM | POA: Diagnosis not present

## 2019-07-06 DIAGNOSIS — Z124 Encounter for screening for malignant neoplasm of cervix: Secondary | ICD-10-CM | POA: Diagnosis not present

## 2019-08-14 ENCOUNTER — Other Ambulatory Visit: Payer: 59

## 2019-09-19 MED FILL — BUPROPION HCL XL 300 MG TAB: 300 | 90 days supply | Qty: 90 | Fill #1

## 2019-09-28 DIAGNOSIS — O219 Vomiting of pregnancy, unspecified: Secondary | ICD-10-CM | POA: Diagnosis not present

## 2019-09-28 DIAGNOSIS — N925 Other specified irregular menstruation: Secondary | ICD-10-CM | POA: Diagnosis not present

## 2019-10-02 MED FILL — METOCLOPRAMIDE 10 MG TABLET: 10 | 15 days supply | Qty: 60 | Fill #0

## 2019-10-17 DIAGNOSIS — Z3492 Encounter for supervision of normal pregnancy, unspecified, second trimester: Secondary | ICD-10-CM | POA: Diagnosis not present

## 2019-10-17 DIAGNOSIS — Z3A11 11 weeks gestation of pregnancy: Secondary | ICD-10-CM | POA: Diagnosis not present

## 2019-10-17 DIAGNOSIS — Z3481 Encounter for supervision of other normal pregnancy, first trimester: Secondary | ICD-10-CM | POA: Diagnosis not present

## 2019-10-30 ENCOUNTER — Ambulatory Visit: Payer: 59 | Attending: Internal Medicine

## 2019-10-30 DIAGNOSIS — Z20822 Contact with and (suspected) exposure to covid-19: Secondary | ICD-10-CM

## 2019-10-31 DIAGNOSIS — Z20828 Contact with and (suspected) exposure to other viral communicable diseases: Secondary | ICD-10-CM | POA: Diagnosis not present

## 2019-10-31 DIAGNOSIS — Z03818 Encounter for observation for suspected exposure to other biological agents ruled out: Secondary | ICD-10-CM | POA: Diagnosis not present

## 2019-10-31 LAB — NOVEL CORONAVIRUS, NAA: SARS-CoV-2, NAA: NOT DETECTED

## 2019-11-15 ENCOUNTER — Other Ambulatory Visit (HOSPITAL_COMMUNITY): Payer: Self-pay | Admitting: Obstetrics & Gynecology

## 2019-11-15 DIAGNOSIS — Z3482 Encounter for supervision of other normal pregnancy, second trimester: Secondary | ICD-10-CM

## 2019-11-15 DIAGNOSIS — Z3A19 19 weeks gestation of pregnancy: Secondary | ICD-10-CM

## 2019-11-15 DIAGNOSIS — Z3689 Encounter for other specified antenatal screening: Secondary | ICD-10-CM

## 2019-11-27 ENCOUNTER — Telehealth: Payer: Self-pay | Admitting: Internal Medicine

## 2019-11-27 MED ORDER — BUTALBITAL-APAP-CAFFEINE 50-325-40 MG PO TABS: 1.0000 | ORAL_TABLET | Freq: Four times a day (QID) | ORAL | 0 refills | Status: DC | PRN

## 2019-11-27 NOTE — Telephone Encounter (Signed)
Catherine Chang is currently pregant with her second child.  She has had a recurrence of migraine headaches that have been severe.  Not fully responsive to tylenol has taken occasional naproxen but does not want to take for too long due to pregnancy, is potentially interested in prophylaxis.  I discussed with her will provide Fioricet Rx, we can have a more detailed discussion about prophylaxis if necessary.

## 2019-11-28 MED FILL — BUTALB-ACETAMIN-CAFF 50-325: 50-325-40 | 3 days supply | Qty: 20 | Fill #0

## 2019-12-06 ENCOUNTER — Telehealth: Payer: Self-pay | Admitting: Internal Medicine

## 2019-12-06 NOTE — Telephone Encounter (Signed)
Received call from [place] has been helping with headaches.  Still having almost daily headaches but does not take the medication on mild days.  Overall doing better.  She does report she is having some difficulty maintaining focus at work and concentrating.  Wondering about the safety of adderall or other stimulant medications.  I discussed that I would need to look into the issue a bit more.  But if symptoms were severe it is sometimes needed.

## 2019-12-11 ENCOUNTER — Encounter (HOSPITAL_COMMUNITY): Payer: Self-pay | Admitting: *Deleted

## 2019-12-12 ENCOUNTER — Ambulatory Visit (HOSPITAL_COMMUNITY): Payer: 59 | Admitting: *Deleted

## 2019-12-12 ENCOUNTER — Other Ambulatory Visit: Payer: Self-pay

## 2019-12-12 ENCOUNTER — Encounter (HOSPITAL_COMMUNITY): Payer: Self-pay

## 2019-12-12 ENCOUNTER — Ambulatory Visit (HOSPITAL_COMMUNITY)
Admission: RE | Admit: 2019-12-12 | Discharge: 2019-12-12 | Disposition: A | Discharge status: Home / Self Care | Payer: 59 | Source: Ambulatory Visit | Attending: Obstetrics and Gynecology | Admitting: Obstetrics and Gynecology

## 2019-12-12 VITALS — BP 139/74 | HR 98 | Temp 97.8°F

## 2019-12-12 DIAGNOSIS — Z3A19 19 weeks gestation of pregnancy: Secondary | ICD-10-CM | POA: Diagnosis not present

## 2019-12-12 DIAGNOSIS — Z8279 Family history of other congenital malformations, deformations and chromosomal abnormalities: Secondary | ICD-10-CM | POA: Diagnosis not present

## 2019-12-12 DIAGNOSIS — Z0489 Encounter for examination and observation for other specified reasons: Secondary | ICD-10-CM

## 2019-12-12 DIAGNOSIS — O34219 Maternal care for unspecified type scar from previous cesarean delivery: Secondary | ICD-10-CM | POA: Insufficient documentation

## 2019-12-12 DIAGNOSIS — IMO0002 Reserved for concepts with insufficient information to code with codable children: Secondary | ICD-10-CM

## 2019-12-12 DIAGNOSIS — Z3689 Encounter for other specified antenatal screening: Secondary | ICD-10-CM | POA: Diagnosis not present

## 2019-12-12 DIAGNOSIS — O352XX0 Maternal care for (suspected) hereditary disease in fetus, not applicable or unspecified: Secondary | ICD-10-CM

## 2019-12-12 DIAGNOSIS — Z363 Encounter for antenatal screening for malformations: Secondary | ICD-10-CM | POA: Insufficient documentation

## 2019-12-12 DIAGNOSIS — Z3482 Encounter for supervision of other normal pregnancy, second trimester: Secondary | ICD-10-CM

## 2019-12-12 IMAGING — US US MFM OB DETAIL+14 WK
1 series · 13 of 28 positions shown · non-contrast
Comparison: none

[Series 1: us mfm ob detail+14 wk · 13 of 91 slices shown]
[im 4/91]
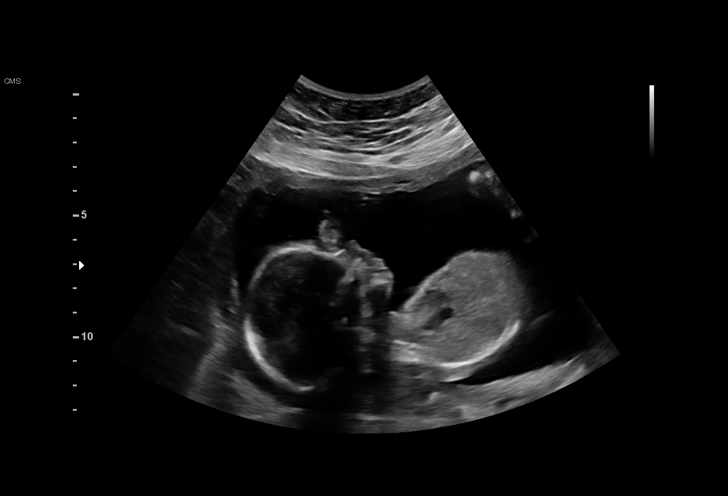
[im 11/91]
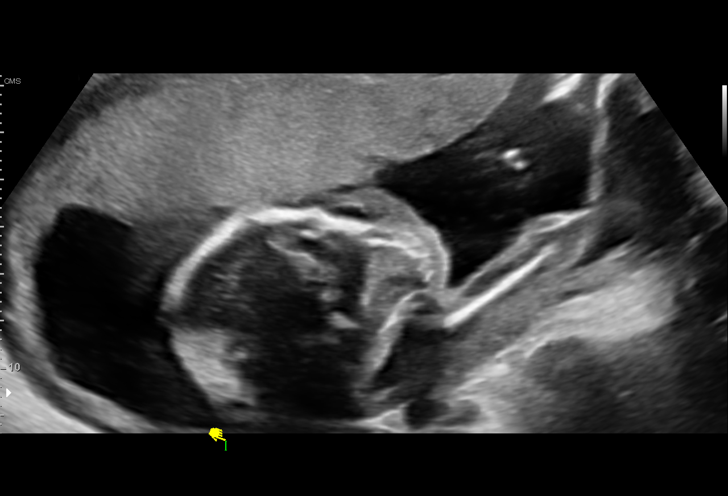
[im 17/91]
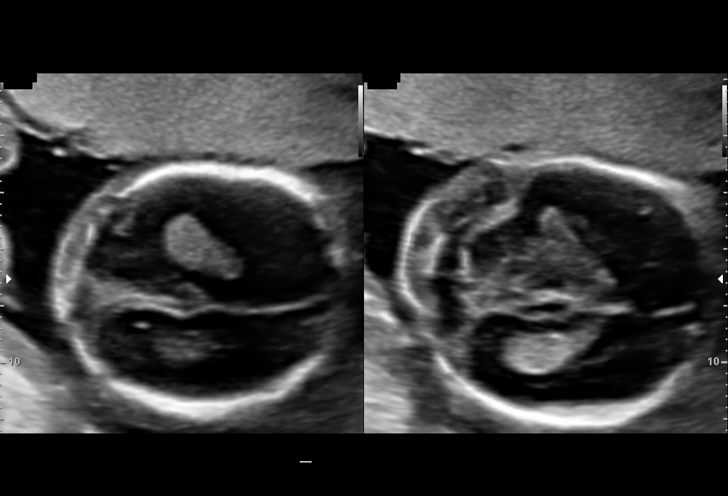
[im 24/91]
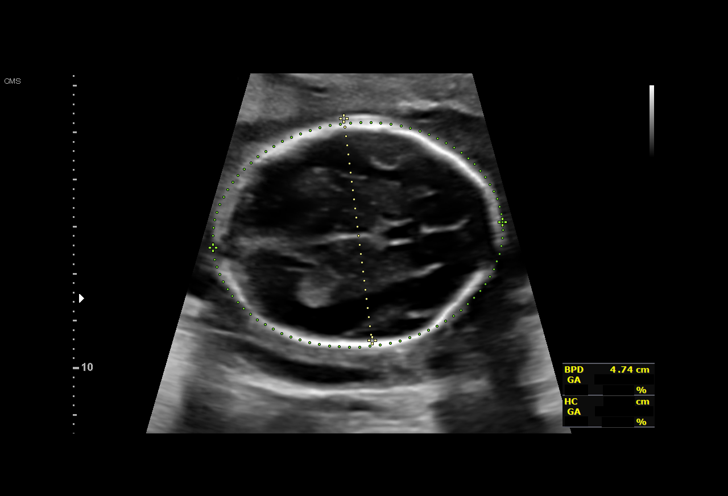
[im 31/91]
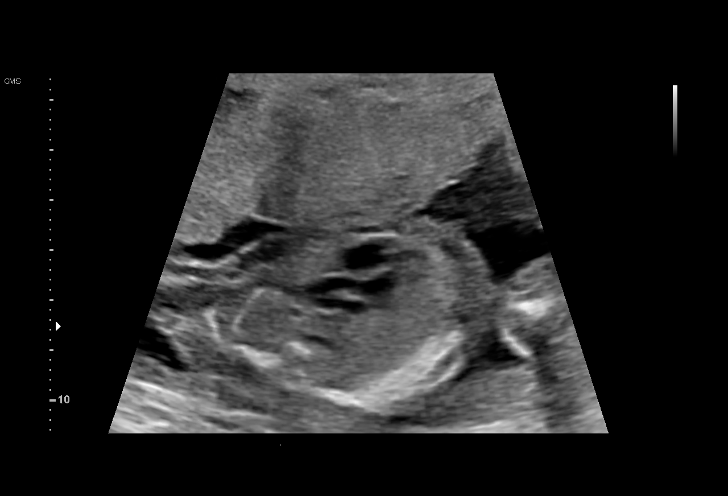
[im 37/91]
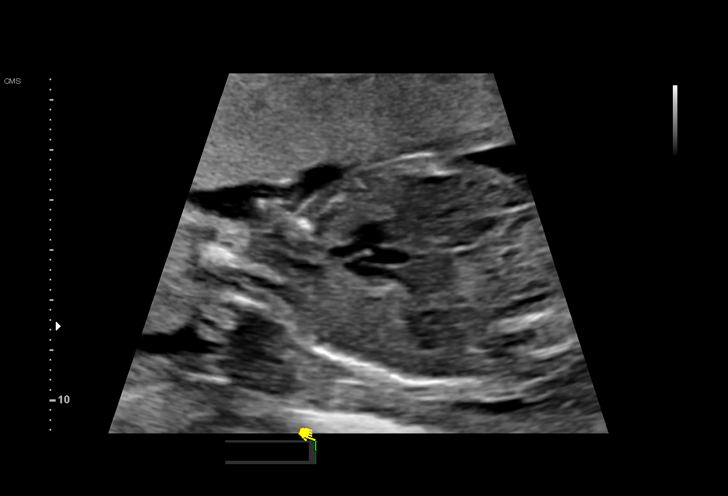
[im 47/91]
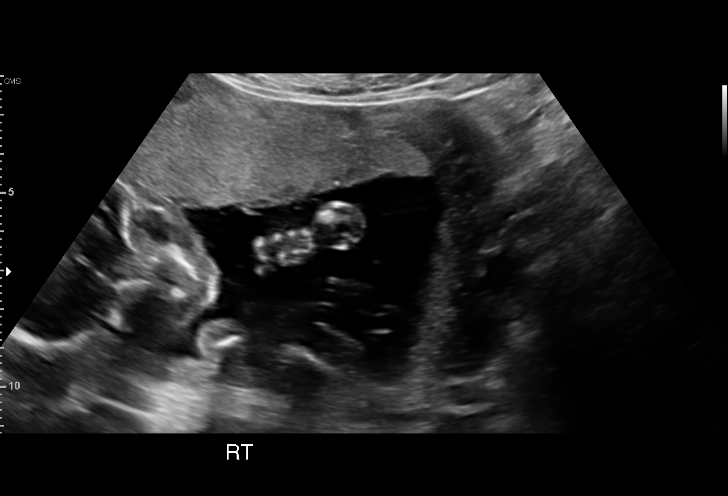
[im 54/91]
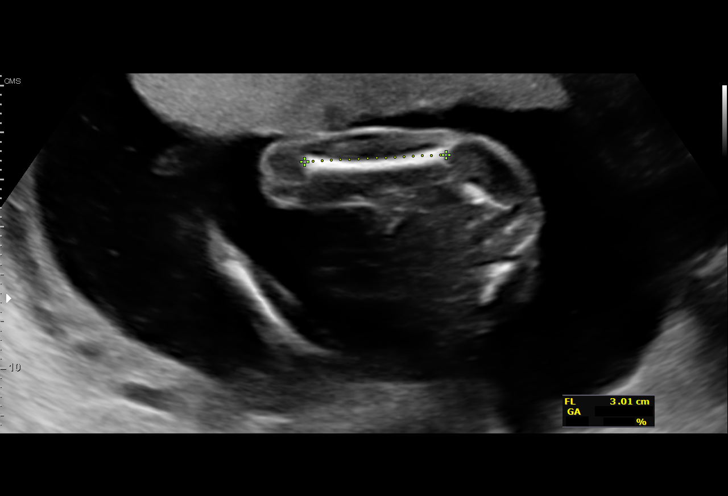
[im 61/91]
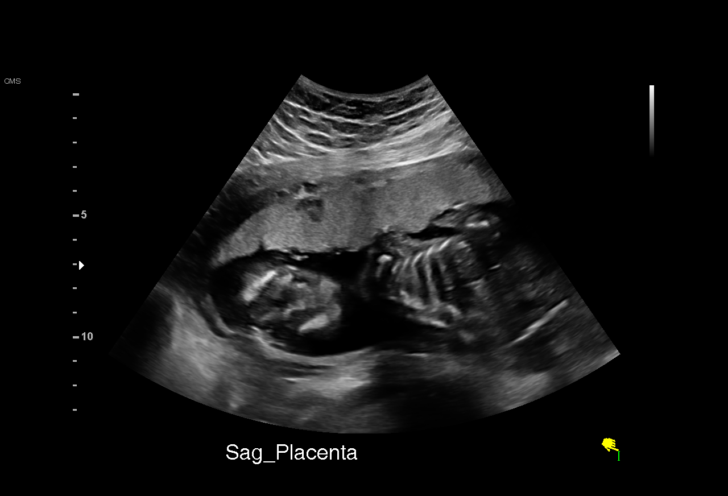
[im 67/91]
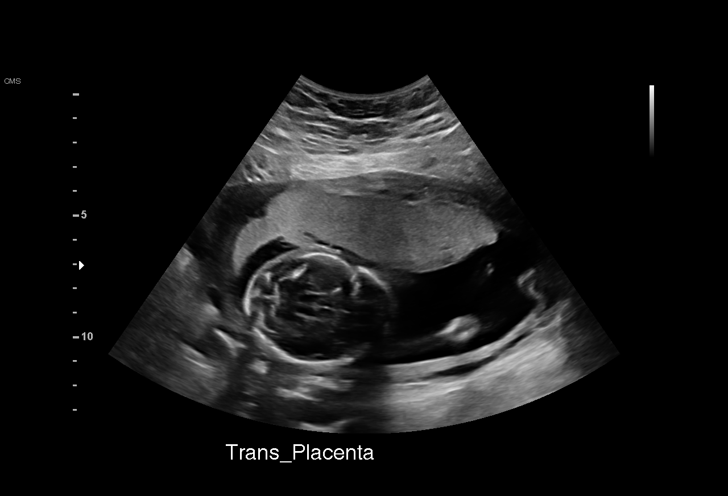
[im 74/91]
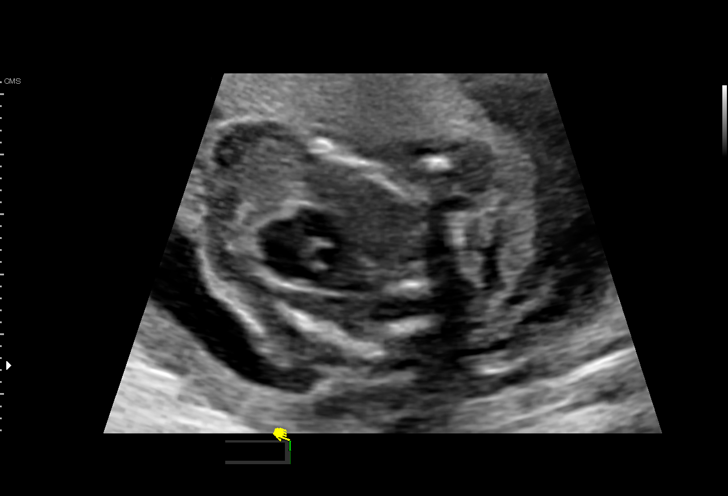
[im 81/91]
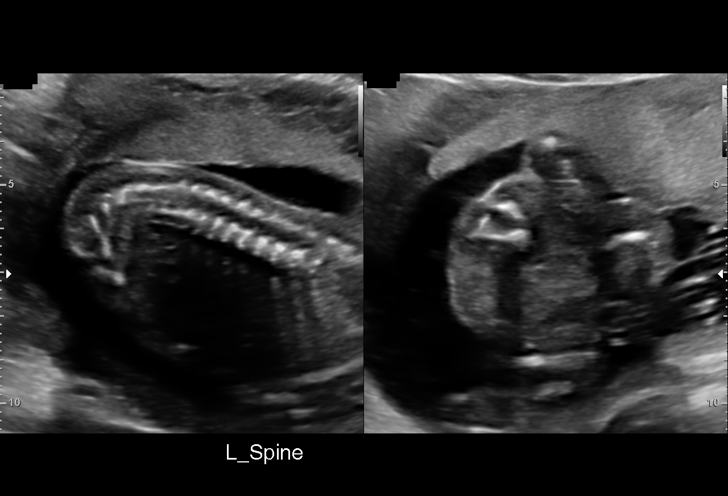
[im 87/91]
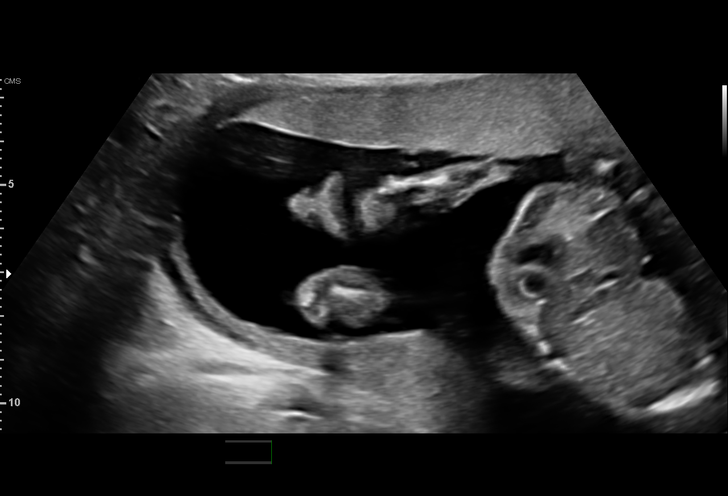

[13 of 28 positions shown; findings below may reference images not displayed]

#130

  1  US MFM OB DETAIL +14 WK              76811.01     AUJLA
 ----------------------------------------------------------------------

 ----------------------------------------------------------------------
Indications

  19 weeks gestation of pregnancy
  Antenatal screening for malformations (low     [G8]
  risk NIPS, declined AFP)
  Previous child with congenital anomaly,        [G8]
  antepartum (cleft palate)
  Previous cesarean delivery, antepartum         [G8]
 ----------------------------------------------------------------------
Fetal Evaluation

 Num Of Fetuses:         1
 Fetal Heart Rate(bpm):  154
 Cardiac Activity:       Observed
 Presentation:           Transverse, head to maternal right
 Placenta:               Anterior
 P. Cord Insertion:      Visualized

 Amniotic Fluid
 AFI FV:      Within normal limits

                             Largest Pocket(cm)

Biometry

 BPD:      46.8  mm     G. Age:  20w 1d         63  %    CI:        72.73   %    70 - 86
                                                         FL/HC:      17.9   %    16.8 -
 HC:      174.5  mm     G. Age:  20w 0d         48  %    HC/AC:      1.16        1.09 -
 AC:      150.2  mm     G. Age:  20w 2d         58  %    FL/BPD:     66.7   %
 FL:       31.2  mm     G. Age:  19w 5d         36  %    FL/AC:      20.8   %    20 - 24
 HUM:      28.1  mm     G. Age:  19w 0d         32  %
 CER:      19.6  mm     G. Age:  18w 6d         24  %
 NFT:       5.4  mm
 CM:        4.4  mm
 Est. FW:     326  gm    0 lb 11 oz      54  %
OB History

 Gravidity:    2         Term:   1        Prem:   0        SAB:   0
 TOP:          0       Ectopic:  0        Living: 1
Gestational Age

 LMP:           19w 6d        Date:  [DATE]                 EDD:   [DATE]
 U/S Today:     20w 0d                                        EDD:   [DATE]
 Best:          19w 6d     Det. By:  LMP  ([DATE])          EDD:   [DATE]
Anatomy

 Cranium:               Appears normal         LVOT:                   Appears normal
 Cavum:                 Appears normal         Aortic Arch:            Appears normal
 Ventricles:            Appears normal         Ductal Arch:            Appears normal
 Choroid Plexus:        Appears normal         Diaphragm:              Appears normal
 Cerebellum:            Appears normal         Stomach:                Appears normal, left
                                                                       sided
 Posterior Fossa:       Appears normal         Abdomen:                Appears normal
 Nuchal Fold:           Appears normal         Abdominal Wall:         Appears nml (cord
                                                                       insert, abd wall)
 Face:                  Appears normal         Cord Vessels:           Appears normal (3
                        (orbits and profile)                           vessel cord)
 Lips:                  Appears normal         Kidneys:                Appear normal
 Palate:                Not well visualized    Bladder:                Appears normal
 Thoracic:              Appears normal         Spine:                  Appears normal
 Heart:                 Appears normal         Upper Extremities:      Appears normal
                        (4CH, axis, and
                        situs)
 RVOT:                  Appears normal         Lower Extremities:      Appears normal

 Other:  Heels/feet and hands/5th digits visualized. Nasal bone visualized.
Cervix Uterus Adnexa

 Cervix
 Length:           3.13  cm.
 Normal appearance by transabdominal scan.

 Uterus
 No abnormality visualized.

 Cul De Sac
 No free fluid seen.

 Adnexa
 No abnormality visualized.
Impression

 G2 P1.  Patient is here for fetal anatomy scan.  On cell free
 fetal DNA screening, the risks of fetal aneuploidies are not
 increased.
 Obstetric history is significant for a term cesarean delivery
 (breech presentation in labor) in [G8] of a female infant.  Her
 daughter had isolated cleft palate that was repaired and she
 is in good health now.

 We performed fetal anatomy scan. No makers of
 aneuploidies or fetal structural defects are seen.  Fetal face
 and lips appear normal.  I could not see any evidence of cleft
 palate.  Fetal biometry is consistent with her previously-
 established dates. Amniotic fluid is normal and good fetal
 activity is seen. Patient understands the limitations of
 ultrasound in detecting fetal anomalies.

 Placenta is anterior and there is no evidence of previa or
 accreta.

 I informed the patient that isolated cleft palatal abnormalities
 are difficult to detect on prenatal ultrasound.
Recommendations

 Follow-up scans as clinically indicated.
                 AUJLA

## 2019-12-13 ENCOUNTER — Other Ambulatory Visit (HOSPITAL_COMMUNITY): Payer: Self-pay | Admitting: Obstetrics & Gynecology

## 2019-12-18 ENCOUNTER — Telehealth: Payer: Self-pay | Admitting: Internal Medicine

## 2019-12-18 MED ORDER — BUTALBITAL-APAP-CAFFEINE 50-325-40 MG PO TABS: 2.0000 | ORAL_TABLET | Freq: Four times a day (QID) | ORAL | 0 refills | Status: DC | PRN

## 2019-12-18 MED FILL — buPROPion HCL ER (XL) 300 M: 300 | 90 days supply | Qty: 90 | Fill #2

## 2019-12-18 MED FILL — BUTALB-ACETAMIN-CAFF 50-325: 50-325-40 | 5 days supply | Qty: 40 | Fill #0

## 2019-12-18 NOTE — Telephone Encounter (Signed)
HA have greatly improved with assistance of Fioricet for abortive therapy, usually has to take 2 tablets at a time.  Reports 20 wk anatomy scan was good without any issues, is also occasionally using Excedrin when HA are not severe at the advice of OB, not taking any other NSAIDs.  Concentration is doing a little better. Doesn't think she needs any treatment for ADHD at this time.

## 2019-12-27 DIAGNOSIS — O479 False labor, unspecified: Secondary | ICD-10-CM | POA: Diagnosis not present

## 2019-12-27 DIAGNOSIS — Z3492 Encounter for supervision of normal pregnancy, unspecified, second trimester: Secondary | ICD-10-CM | POA: Diagnosis not present

## 2019-12-27 DIAGNOSIS — B373 Candidiasis of vulva and vagina: Secondary | ICD-10-CM | POA: Diagnosis not present

## 2019-12-27 DIAGNOSIS — N898 Other specified noninflammatory disorders of vagina: Secondary | ICD-10-CM | POA: Diagnosis not present

## 2019-12-27 DIAGNOSIS — Z3A22 22 weeks gestation of pregnancy: Secondary | ICD-10-CM | POA: Diagnosis not present

## 2020-01-12 MED FILL — FLUCONAZOLE 150 MG TABS: 150 | 1 days supply | Qty: 1 | Fill #1

## 2020-01-30 ENCOUNTER — Other Ambulatory Visit: Payer: Self-pay | Admitting: Internal Medicine

## 2020-01-30 MED ORDER — ERYTHROMYCIN 5 MG/GM OP OINT: 1.0000 "application " | TOPICAL_OINTMENT | Freq: Three times a day (TID) | OPHTHALMIC | 1 refills | Status: DC

## 2020-01-30 MED FILL — ERYTHROMYCIN EYE OINTMENT: 5 | 30 days supply | Qty: 7 | Fill #0

## 2020-01-30 NOTE — Progress Notes (Signed)
Having tearing and mild swelling of left eyelid, history of blocked tear duct which occasional lead to infection/abscess. Briefly I did a fluorecin stain/blacklight, no corneal abrasion.  Has ~10% lid lag, mild swelling of left eyelid.  Will refill erythromycin ointment for use TID.  If does not improve will send to opthalmology.

## 2020-02-05 DIAGNOSIS — Z3492 Encounter for supervision of normal pregnancy, unspecified, second trimester: Secondary | ICD-10-CM | POA: Diagnosis not present

## 2020-02-05 MED FILL — AMPHETAMINE-DEXTROAMPHETAMI: 10 | 30 days supply | Qty: 30 | Fill #0

## 2020-02-14 DIAGNOSIS — O99810 Abnormal glucose complicating pregnancy: Secondary | ICD-10-CM | POA: Diagnosis not present

## 2020-02-16 ENCOUNTER — Telehealth: Payer: Self-pay | Admitting: Internal Medicine

## 2020-02-16 MED ORDER — AMPHETAMINE-DEXTROAMPHETAMINE 10 MG PO TABS: 10.0000 mg | ORAL_TABLET | Freq: Two times a day (BID) | ORAL | 0 refills | Status: DC

## 2020-02-16 NOTE — Telephone Encounter (Signed)
Spoke with Catherine Chang, she has resumed adderall after discussing with OB (we had previously had risk/benefit discussion several weeks ago), now in her third trimester and feels better about resuming.  Restart Rx provided by her OB.  Will provide refill, she will see me in clinic next month.

## 2020-02-19 MED FILL — AMPHETAMINE SALTS 10 MG: 10 | 30 days supply | Qty: 60 | Fill #0

## 2020-02-28 ENCOUNTER — Other Ambulatory Visit: Payer: Self-pay | Admitting: Obstetrics & Gynecology

## 2020-03-14 ENCOUNTER — Encounter: Payer: Self-pay | Admitting: Internal Medicine

## 2020-03-14 ENCOUNTER — Other Ambulatory Visit: Payer: Self-pay

## 2020-03-14 ENCOUNTER — Ambulatory Visit: Payer: 59 | Admitting: Internal Medicine

## 2020-03-14 VITALS — BP 123/78 | HR 112 | Ht 62.0 in | Wt 167.4 lb

## 2020-03-14 DIAGNOSIS — F32A Depression, unspecified: Secondary | ICD-10-CM

## 2020-03-14 DIAGNOSIS — F909 Attention-deficit hyperactivity disorder, unspecified type: Secondary | ICD-10-CM

## 2020-03-14 MED ORDER — AMPHETAMINE-DEXTROAMPHETAMINE 10 MG PO TABS: 10.0000 mg | ORAL_TABLET | Freq: Two times a day (BID) | ORAL | 0 refills | Status: DC

## 2020-03-14 NOTE — Assessment & Plan Note (Addendum)
Remains on wellburtrin 373m daily reports stable mood. Trazodone 1058mQHS PRN for sleep.

## 2020-03-14 NOTE — Assessment & Plan Note (Signed)
Tolerating Adderall well we will continue 10 mg twice daily.  Current plans to stop on maternity leave and will let me know if she wants to resume once returning to work.

## 2020-03-14 NOTE — Progress Notes (Signed)
  Subjective:  HPI: [name] is a 29 y.o. female who presents for follow up of pregnancy associated headaches and ADHD.  She reports overall pregnancy is going well.  Baby is breech and definitely will have a C-section this is currently scheduled for September 8.  Work has been a little bit more difficult/exhausting.  Had previously talked about month ago to her obstetrician about restarting Adderall.  She was given some reassurance this is overall safe if benefits outweigh risk.  She opted to restart it and I have taken back over prescribing.  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  She anticipates she will continue Adderall until delivery and then stop again while on maternity leave.  She has had no adverse effects from Adderall use and is tolerating well.  For her pregnancy associated headaches she notes that these have dramatically improved in the third trimester.  She is no longer needing Fioricet.  Occasionally takes Excedrin Migraine when needed.   Please see Assessment and Plan below for the status of her chronic medical problems.  Objective:  Physical Exam: Vitals:   03/14/20 1054  BP: 123/78  Pulse: (!) 112  SpO2: 99%  Weight: 167 lb 6.4 oz (75.9 kg)  Height: 5' 2"  (1.575 m)   Body mass index is 30.62 kg/m. Physical Exam Vitals and nursing note reviewed.  Constitutional:      Appearance: Normal appearance.  Eyes:     Extraocular Movements: Extraocular movements intact.     Conjunctiva/sclera: Conjunctivae normal.  Pulmonary:     Effort: Pulmonary effort is normal.  Abdominal:     Comments: gravid  Neurological:     Mental Status: She is alert. Mental status is at baseline.  Psychiatric:        Mood and Affect: Mood normal.        Behavior: Behavior normal.        Thought Content: Thought content normal.        Judgment:  Judgment normal.    Assessment & Plan:  See Encounters Tab for problem based charting.  Medications Ordered Meds ordered this encounter  Medications  . amphetamine-dextroamphetamine (ADDERALL) 10 MG tablet    Sig: Take 1 tablet (10 mg total) by mouth 2 (two) times daily.    Dispense:  60 tablet    Refill:  0   Other Orders No orders of the defined types were placed in this encounter.  Follow Up: Return in about 1 year (around 03/14/2021).

## 2020-03-19 DIAGNOSIS — Z3A33 33 weeks gestation of pregnancy: Secondary | ICD-10-CM | POA: Diagnosis not present

## 2020-03-19 DIAGNOSIS — Z3492 Encounter for supervision of normal pregnancy, unspecified, second trimester: Secondary | ICD-10-CM | POA: Diagnosis not present

## 2020-03-19 DIAGNOSIS — O26849 Uterine size-date discrepancy, unspecified trimester: Secondary | ICD-10-CM | POA: Diagnosis not present

## 2020-03-19 DIAGNOSIS — Z9889 Other specified postprocedural states: Secondary | ICD-10-CM | POA: Diagnosis not present

## 2020-03-19 DIAGNOSIS — O4103X0 Oligohydramnios, third trimester, not applicable or unspecified: Secondary | ICD-10-CM | POA: Diagnosis not present

## 2020-03-19 MED FILL — buPROPion HCL ER (XL) 300 M: 300 | 90 days supply | Qty: 90 | Fill #3

## 2020-03-20 MED FILL — AMPHETAMINE SALTS 10 MG: 10 | 30 days supply | Qty: 60 | Fill #0

## 2020-03-29 DIAGNOSIS — O4103X0 Oligohydramnios, third trimester, not applicable or unspecified: Secondary | ICD-10-CM | POA: Diagnosis not present

## 2020-03-29 DIAGNOSIS — Z3A35 35 weeks gestation of pregnancy: Secondary | ICD-10-CM | POA: Diagnosis not present

## 2020-03-29 DIAGNOSIS — Z3483 Encounter for supervision of other normal pregnancy, third trimester: Secondary | ICD-10-CM | POA: Diagnosis not present

## 2020-04-01 MED FILL — METOCLOPRAMIDE 10 MG TABLET: 10 | 15 days supply | Qty: 60 | Fill #1

## 2020-04-01 MED FILL — traZODone HCL 100 MG TABS: 100 | 90 days supply | Qty: 90 | Fill #1

## 2020-04-01 MED FILL — NIFEdipine 10 MG CAPS: 10 | 15 days supply | Qty: 60 | Fill #0 | Status: TO

## 2020-04-01 MED FILL — NIFEdipine 10 MG CAPS: 10 | 15 days supply | Qty: 60 | Fill #0

## 2020-04-03 ENCOUNTER — Encounter (HOSPITAL_COMMUNITY): Payer: Self-pay | Admitting: Obstetrics & Gynecology

## 2020-04-03 ENCOUNTER — Inpatient Hospital Stay (HOSPITAL_BASED_OUTPATIENT_CLINIC_OR_DEPARTMENT_OTHER): Payer: 59

## 2020-04-03 ENCOUNTER — Other Ambulatory Visit: Payer: Self-pay

## 2020-04-03 ENCOUNTER — Observation Stay (HOSPITAL_COMMUNITY)
Admission: AD | Admit: 2020-04-03 | Discharge: 2020-04-03 | Disposition: A | Discharge status: Home / Self Care | Payer: 59 | Attending: Obstetrics & Gynecology | Admitting: Obstetrics & Gynecology

## 2020-04-03 DIAGNOSIS — O4100X0 Oligohydramnios, unspecified trimester, not applicable or unspecified: Secondary | ICD-10-CM

## 2020-04-03 DIAGNOSIS — Z363 Encounter for antenatal screening for malformations: Secondary | ICD-10-CM | POA: Diagnosis not present

## 2020-04-03 DIAGNOSIS — O288 Other abnormal findings on antenatal screening of mother: Secondary | ICD-10-CM | POA: Diagnosis present

## 2020-04-03 DIAGNOSIS — Z20822 Contact with and (suspected) exposure to covid-19: Secondary | ICD-10-CM | POA: Diagnosis not present

## 2020-04-03 DIAGNOSIS — O4103X1 Oligohydramnios, third trimester, fetus 1: Principal | ICD-10-CM | POA: Insufficient documentation

## 2020-04-03 DIAGNOSIS — Z3A36 36 weeks gestation of pregnancy: Secondary | ICD-10-CM | POA: Insufficient documentation

## 2020-04-03 DIAGNOSIS — Z3492 Encounter for supervision of normal pregnancy, unspecified, second trimester: Secondary | ICD-10-CM | POA: Diagnosis not present

## 2020-04-03 DIAGNOSIS — O34219 Maternal care for unspecified type scar from previous cesarean delivery: Secondary | ICD-10-CM | POA: Diagnosis not present

## 2020-04-03 DIAGNOSIS — O352XX0 Maternal care for (suspected) hereditary disease in fetus, not applicable or unspecified: Secondary | ICD-10-CM | POA: Diagnosis not present

## 2020-04-03 DIAGNOSIS — Z8669 Personal history of other diseases of the nervous system and sense organs: Secondary | ICD-10-CM

## 2020-04-03 DIAGNOSIS — O368931 Maternal care for other specified fetal problems, third trimester, fetus 1: Secondary | ICD-10-CM | POA: Diagnosis not present

## 2020-04-03 DIAGNOSIS — O4103X0 Oligohydramnios, third trimester, not applicable or unspecified: Secondary | ICD-10-CM | POA: Diagnosis not present

## 2020-04-03 DIAGNOSIS — O368131 Decreased fetal movements, third trimester, fetus 1: Secondary | ICD-10-CM | POA: Diagnosis present

## 2020-04-03 HISTORY — DX: Oligohydramnios, unspecified trimester, not applicable or unspecified: O41.00X0

## 2020-04-03 HISTORY — DX: Other abnormal findings on antenatal screening of mother: O28.8

## 2020-04-03 LAB — AMNISURE RUPTURE OF MEMBRANE (ROM) NOT AT ARMC: Amnisure ROM: NEGATIVE

## 2020-04-03 LAB — COMPREHENSIVE METABOLIC PANEL
ALT: 28 U/L (ref 0–44)
AST: 27 U/L (ref 15–41)
Albumin: 2.5 g/dL — ABNORMAL LOW (ref 3.5–5.0)
Alkaline Phosphatase: 255 U/L — ABNORMAL HIGH (ref 38–126)
Anion gap: 10 (ref 5–15)
BUN: 6 mg/dL (ref 6–20)
CO2: 19 mmol/L — ABNORMAL LOW (ref 22–32)
Calcium: 9.1 mg/dL (ref 8.9–10.3)
Chloride: 108 mmol/L (ref 98–111)
Creatinine, Ser: 0.8 mg/dL (ref 0.44–1.00)
GFR calc Af Amer: >60 mL/min (ref >60)
GFR calc non Af Amer: >60 mL/min (ref >60)
Glucose, Bld: 87 mg/dL (ref 70–99)
Potassium: 3.7 mmol/L (ref 3.5–5.1)
Sodium: 137 mmol/L (ref 135–145)
Total Bilirubin: 0.2 mg/dL — ABNORMAL LOW (ref 0.3–1.2)
Total Protein: 5.4 g/dL — ABNORMAL LOW (ref 6.5–8.1)

## 2020-04-03 LAB — SARS CORONAVIRUS 2 BY RT PCR (HOSPITAL ORDER, PERFORMED IN ~~LOC~~ HOSPITAL LAB): SARS Coronavirus 2: NEGATIVE

## 2020-04-03 LAB — CBC
HCT: 35.8 % — ABNORMAL LOW (ref 36.0–46.0)
Hemoglobin: 11.7 g/dL — ABNORMAL LOW (ref 12.0–15.0)
MCH: 29.8 pg (ref 26.0–34.0)
MCHC: 32.7 g/dL (ref 30.0–36.0)
MCV: 91.1 fL (ref 80.0–100.0)
Platelets: 230 10*3/uL (ref 150–400)
RBC: 3.93 MIL/uL (ref 3.87–5.11)
RDW: 12.9 % (ref 11.5–15.5)
WBC: 8.3 10*3/uL (ref 4.0–10.5)
nRBC: 0 % (ref 0.0–0.2)

## 2020-04-03 LAB — TYPE AND SCREEN
ABO/RH(D): A POS
Antibody Screen: NEGATIVE

## 2020-04-03 LAB — PROTEIN / CREATININE RATIO, URINE
Creatinine, Urine: 55.34 mg/dL
Total Protein, Urine: <6 mg/dL

## 2020-04-03 IMAGING — US US MFM FETAL BPP W/O NON-STRESS
1 series · 12 of 25 positions shown · non-contrast
Comparison: none

[Series 1: us mfm fetal bpp w/o non-stress · 25 acquisitions, 12 frames shown]
[im 2/25]
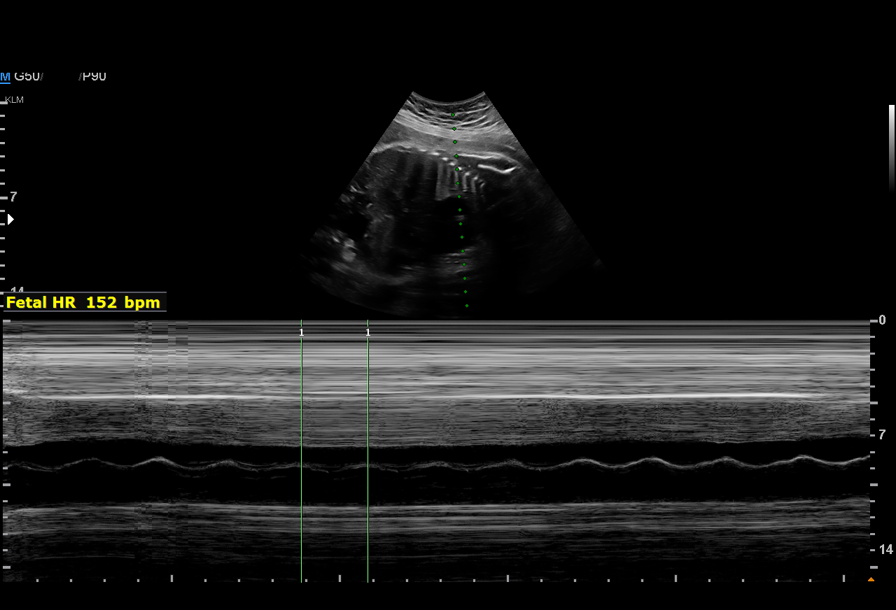
[im 4/25]
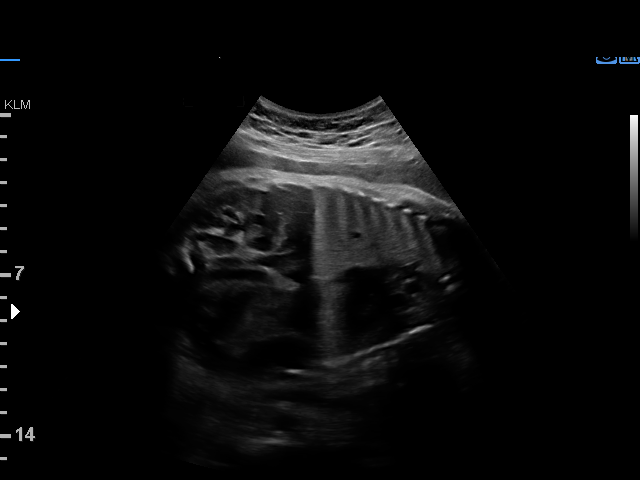
[im 6/25]
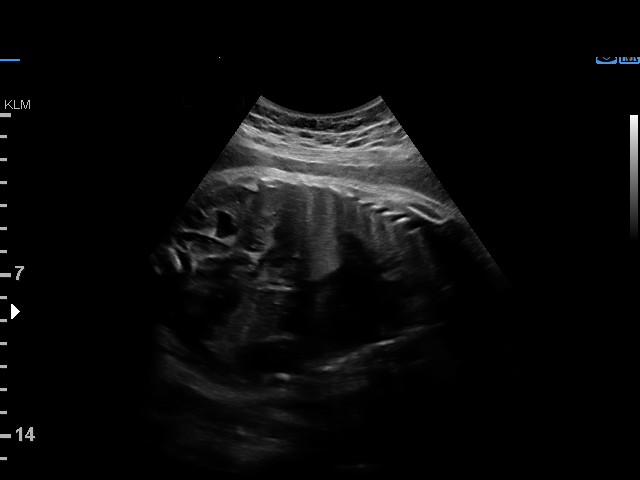
[im 8/25]
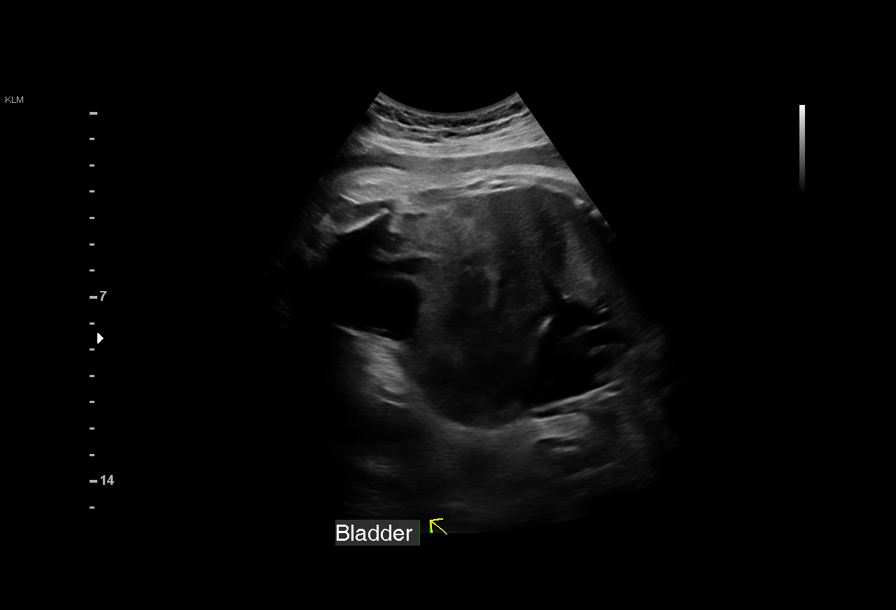
[im 10/25]
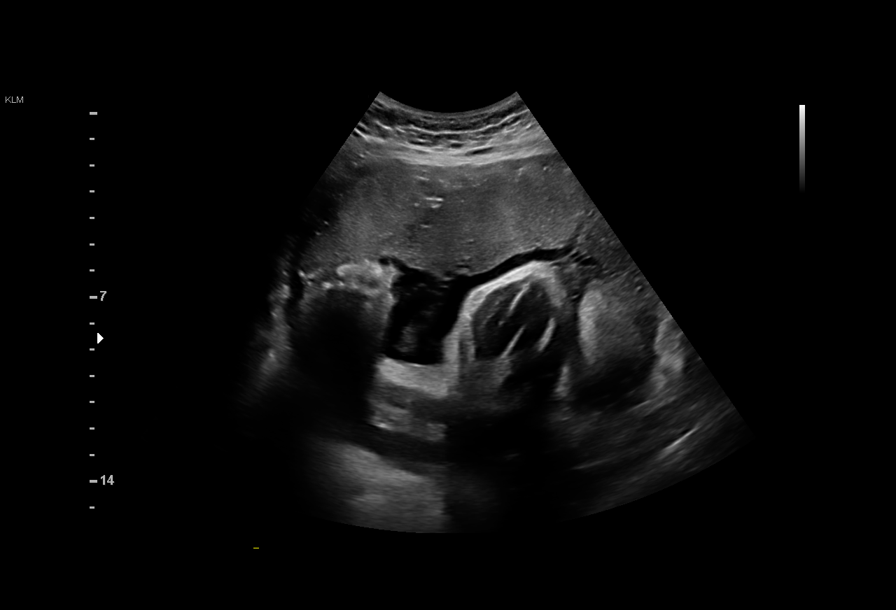
[im 12/25]
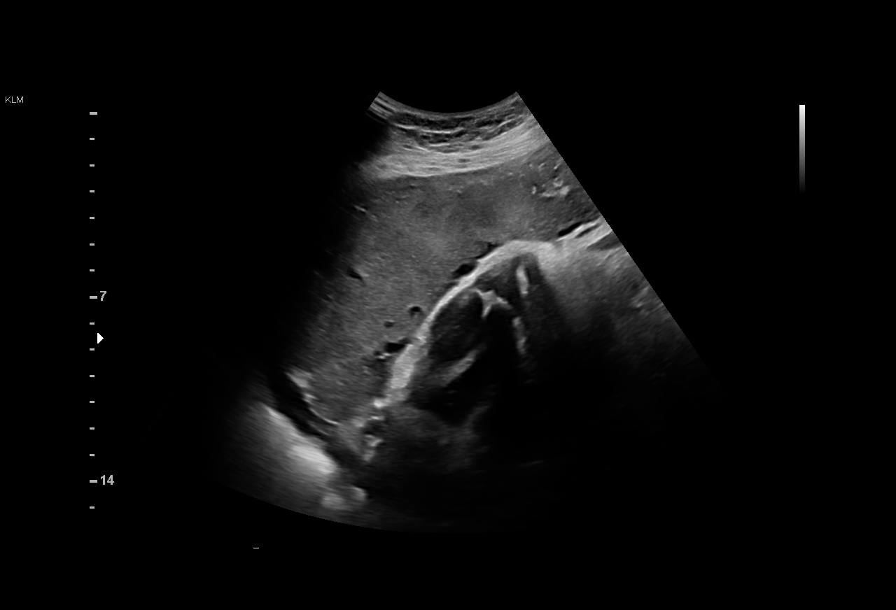
[im 14/25]
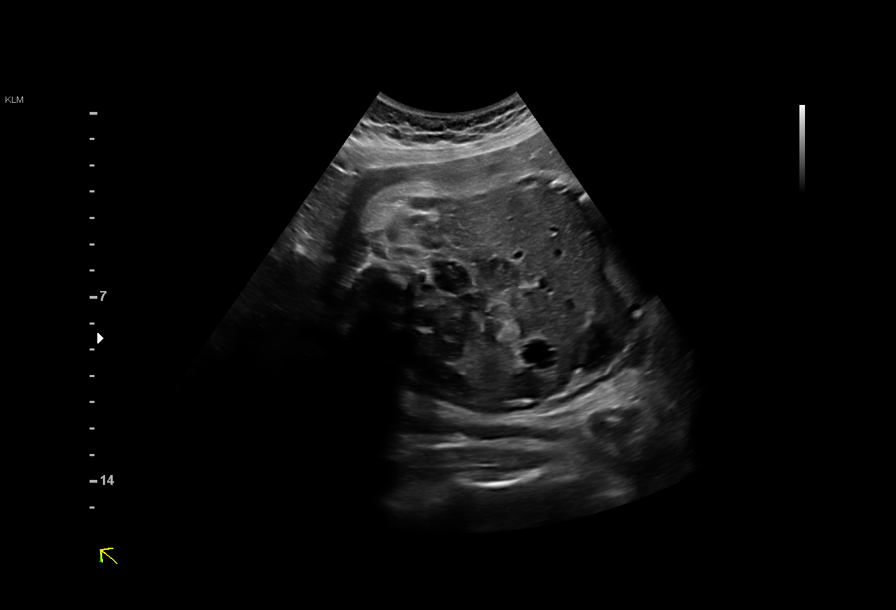
[im 16/25]
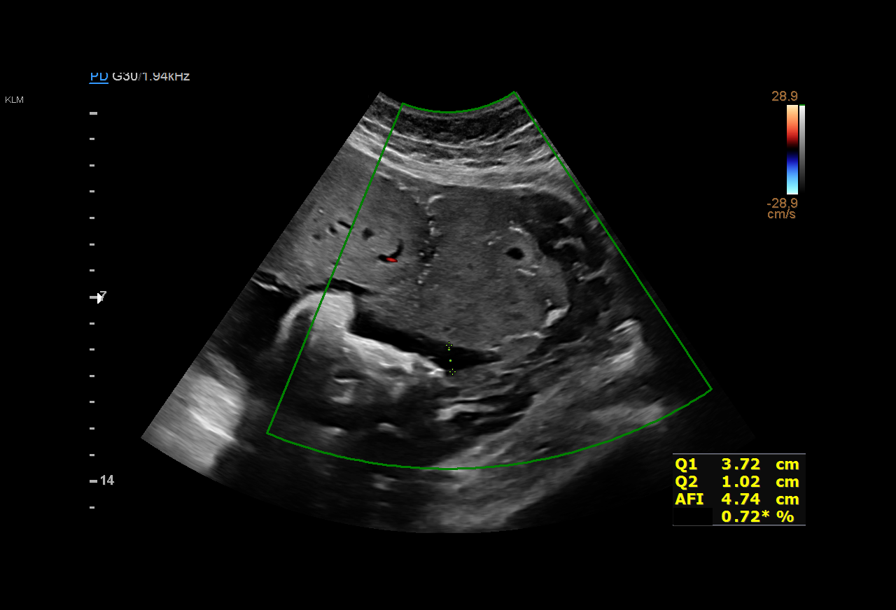
[im 18/25]
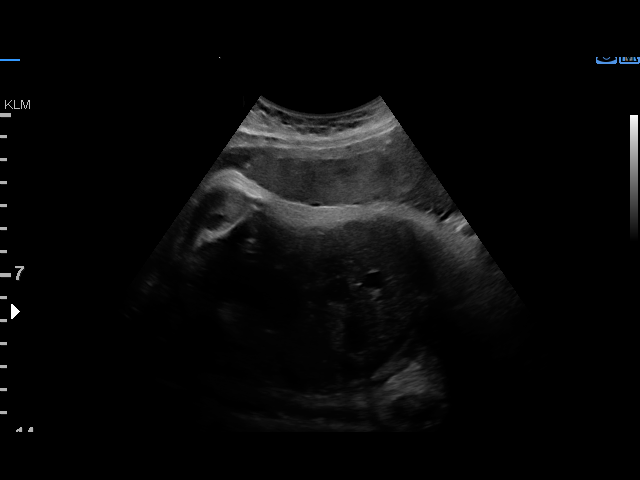
[im 20/25]
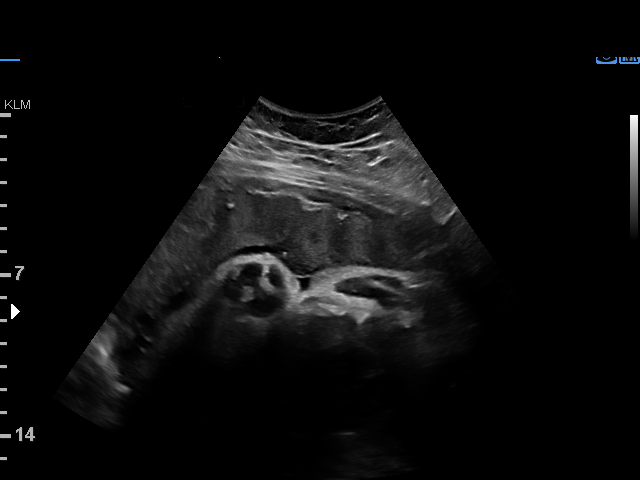
[im 22/25]
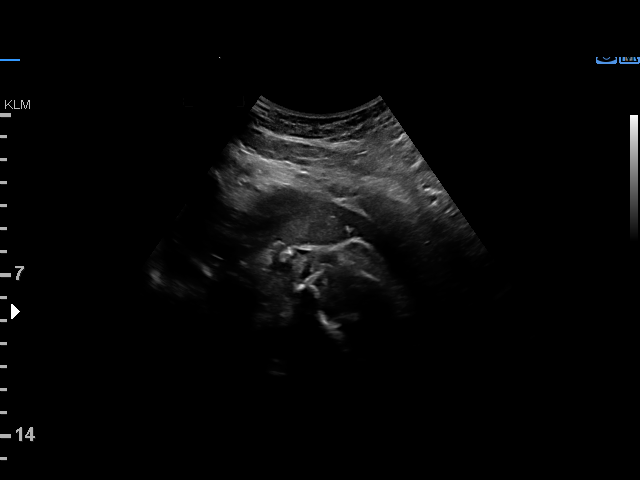
[im 24/25]
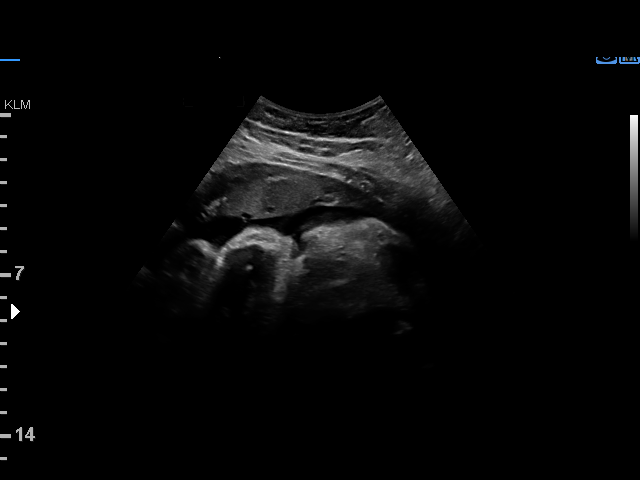

[12 of 25 positions shown; findings below may reference images not displayed]

#130
                                                             Care

Indications

 Oligohydramnios / Decreased amniotic fluid      [L5]
 volume
 36 weeks gestation of pregnancy
 Antenatal screening for malformations (low      [L5]
 risk NIPS, declined AFP)
 Previous child with congenital anomaly,         [L5]
 antepartum (cleft palate)
 Previous cesarean delivery, antepartum          [L5]
Fetal Evaluation

 Num Of Fetuses:          1
 Fetal Heart Rate(bpm):   152
 Cardiac Activity:        Observed
 Presentation:            Cephalic
 Placenta:                Anterior
 P. Cord Insertion:       Previously Visualized

 Amniotic Fluid
 AFI FV:      Within normal limits

 AFI Sum(cm)     %Tile       Largest Pocket(cm)


 RUQ(cm)                     LUQ(cm)        LLQ(cm)

Biophysical Evaluation

 Amniotic F.V:   Within normal limits       F. Tone:         Observed
 F. Movement:    Observed                   Score:           [DATE]
 F. Breathing:   Observed
OB History

 Gravidity:    2         Term:   1        Prem:   0        SAB:   0
 TOP:          0       Ectopic:  0        Living: 1
Gestational Age

 LMP:           36w 0d        Date:  [DATE]                 EDD:   [DATE]
 Best:          36w 0d     Det. By:  LMP  ([DATE])          EDD:   [DATE]
Anatomy

 Stomach:               Appears normal, left   Bladder:                Appears normal
                        sided
Impression

 Antenatal testing for low amniotic fluid seen on outside
 examination
 Biophysical profile [DATE] with normal amnoitic fluid (AFI 7) and
 fetal movement

 Ms NEP denies loss of fluid per provider report. In
 discussion with Dr. [REDACTED] suggested
 oligohydramnios with an amniotic fluid vertical pocket ^1
 cm.Therefore the biophysical profile was ordered. Ms.
 NEP overall has a low risk pregnancy and had a recent
 normal growth per Dr. NEP.

 Given the normal amniotic fluid index and no clincal indication
 of rupture of membranes we recommend continue outpatient
 monitoring and routine surviellance with plan for repeat
 cesarean delivery at 39 weeks as planned.

 I discussed this recommendation with Dr. NEP. In addition,
 she plans to schedule a repeat AFI next week at our offices
 for consistency.
Recommendations

 Follow AFI in 1 week at our offices.

## 2020-04-03 MED ORDER — ZOLPIDEM TARTRATE 5 MG PO TABS: 5.0000 mg | ORAL_TABLET | Freq: Every evening | ORAL | Status: DC | PRN

## 2020-04-03 MED ORDER — BETAMETHASONE SOD PHOS & ACET 6 (3-3) MG/ML IJ SUSP: 12.0000 mg | INTRAMUSCULAR | 0 refills | Status: DC

## 2020-04-03 MED ORDER — PRENATAL MULTIVITAMIN CH: 1.0000 | ORAL_TABLET | Freq: Every day | ORAL | Status: DC

## 2020-04-03 MED ORDER — LACTATED RINGERS IV BOLUS
500.0000 mL | Freq: Once | INTRAVENOUS | Status: AC
  Administered 2020-04-03: 500 mL via INTRAVENOUS

## 2020-04-03 MED ORDER — NIFEDIPINE 10 MG PO CAPS
20.0000 mg | ORAL_CAPSULE | Freq: Once | ORAL | Status: AC
  Administered 2020-04-03: 20 mg via ORAL
  Filled 2020-04-03: qty 2

## 2020-04-03 MED ORDER — CALCIUM CARBONATE ANTACID 500 MG PO CHEW
2.0000 | CHEWABLE_TABLET | ORAL | Status: DC | PRN
  Administered 2020-04-03: 400 mg via ORAL
  Filled 2020-04-03: qty 2

## 2020-04-03 MED ORDER — LACTATED RINGERS IV SOLN: INTRAVENOUS | Status: DC

## 2020-04-03 MED ORDER — DOCUSATE SODIUM 100 MG PO CAPS
100.0000 mg | ORAL_CAPSULE | Freq: Every day | ORAL | Status: DC
  Administered 2020-04-03: 100 mg via ORAL
  Filled 2020-04-03: qty 1

## 2020-04-03 MED ORDER — ACETAMINOPHEN 325 MG PO TABS: 650.0000 mg | ORAL_TABLET | ORAL | Status: DC | PRN

## 2020-04-03 MED ORDER — BETAMETHASONE SOD PHOS & ACET 6 (3-3) MG/ML IJ SUSP
12.0000 mg | INTRAMUSCULAR | Status: DC
  Administered 2020-04-03: 12 mg via INTRAMUSCULAR
  Filled 2020-04-03: qty 5

## 2020-04-03 NOTE — Discharge Summary (Signed)
Antenatal Discharge    Patient Name: Catherine Chang DOB: [birth year] MRN: [id number]  Date of admission: 04/03/2020 Date of discharge: 04/03/2020  Admitting diagnosis: Amniotic fluid index decreased [O28.8] Oligohydramnios antepartum [O41.00X0] Intrauterine pregnancy: [redacted]w[redacted]d    Secondary diagnosis:Active Problems:   Amniotic fluid index decreased   Oligohydramnios antepartum   History of migraine with aura  Additional problems: Patient Active Problem List   Diagnosis Date Noted   Amniotic fluid index decreased 04/03/2020   Oligohydramnios antepartum 04/03/2020   History of migraine with aura 04/03/2020   Depression 03/16/2019   ADHD 03/16/2019        Discharge diagnosis: Preterm undelivered, decreased amniotic fluid index                                                                    Hospital course: BPP, IV fluids, antenatal steroids x1 dose  Physical exam  Vitals:   04/03/20 1624 04/03/20 1653 04/03/20 1913 04/03/20 1950  BP: (!) 142/90  118/74 113/65  Pulse: 97   (!) 108  Resp: 16     Temp: 98.1 F (36.7 C)     TempSrc: Oral     SpO2: 99%     Weight:  76.2 kg    Height:  5' 2"  (1.575 m)     Labs: Lab Results  Component Value Date   WBC 8.3 04/03/2020   HGB 11.7 (L) 04/03/2020   HCT 35.8 (L) 04/03/2020   MCV 91.1 04/03/2020   PLT 230 04/03/2020   CMP Latest Ref Rng & Units 04/03/2020  Glucose 70 - 99 mg/dL 87  BUN 6 - 20 mg/dL 6  Creatinine 0.44 - 1.00 mg/dL 0.80  Sodium 135 - 145 mmol/L 137  Potassium 3.5 - 5.1 mmol/L 3.7  Chloride 98 - 111 mmol/L 108  CO2 22 - 32 mmol/L 19(L)  Calcium 8.9 - 10.3 mg/dL 9.1  Total Protein 6.5 - 8.1 g/dL 5.4(L)  Total Bilirubin 0.3 - 1.2 mg/dL 0.2(L)  Alkaline Phos 38 - 126 U/L 255(H)  AST 15 - 41 U/L 27  ALT 0 - 44 U/L 28    Discharge instruction: per After Visit Summary and "Baby and Me Booklet".  After Visit Meds:  Allergies as of 04/03/2020   No Known Allergies     Medication List    STOP  taking these medications   butalbital-acetaminophen-caffeine 50-325-40 MG tablet Commonly known as: FIORICET   erythromycin ophthalmic ointment     TAKE these medications   amphetamine-dextroamphetamine 10 MG tablet Commonly known as: ADDERALL Take 1 tablet (10 mg total) by mouth 2 (two) times daily.   betamethasone acetate-betamethasone sodium phosphate 6 (3-3) MG/ML injection Commonly known as: CELESTONE Inject 2 mLs (12 mg total) into the muscle daily. Start taking on: April 04, 2020   buPROPion 300 MG 24 hr tablet Commonly known as: Wellbutrin XL Take 1 tablet (300 mg total) by mouth daily.   fluticasone 50 MCG/ACT nasal spray Commonly known as: FLONASE Place 1 spray into both nostrils daily as needed for allergies or rhinitis.   prenatal multivitamin Tabs tablet Take 1 tablet by mouth daily at 12 noon.   traZODone 100 MG tablet Commonly known as: DESYREL Take 1 tablet (100 mg total) by mouth at bedtime.  Activity: Advance as tolerated.  Outpatient follow up:1 week for OB appt w/ [facility] and BPP and AFI w/ MFM in 1 week  Disposition:home   04/03/2020 Arrie Eastern, [name]

## 2020-04-03 NOTE — Progress Notes (Addendum)
OB ADMISSION/ HISTORY & PHYSICAL:  Admission Date: 04/03/2020  4:03 PM  Admit Diagnosis: Decreased AFI  Catherine Chang is a 29 y.o. female G2P1001 [id number] as a direct admit from [facility] AFI of 2.4 in the office. Endorses active FM, denies LOF and vaginal bleeding. Hx of primary C/S for breech presentation and plans rpt C/S w/ BTL. S/P viral gastroenteritis 2 days ago, denies S/S presently. Here for IV hydration, antenatal steroids, amnisure, and MFM U/S. Dr. PAlwyn Peaconsulted w/ Dr. BGertie Exon see note for MFM details and recommendation.    Patient Active Problem List   Diagnosis Date Noted  . Amniotic fluid index decreased 04/03/2020  . Oligohydramnios antepartum 04/03/2020  . History of migraine with aura 04/03/2020  . Depression 03/16/2019  . ADHD 03/16/2019    Prenatal Labs: ABO, Rh: --/--/A POS (08/18 1749) Antibody: NEG (08/18 1749) Rubella:   immune RPR:   NR HBsAg:   NR HIV:   NR GTT: passed 1 hr GC/CHL: neg/neg Genetics: low-risk female    OB History  Gravida Para Term Preterm AB Living  2 1 1     1   SAB TAB Ectopic Multiple Live Births        0 1    # Outcome Date GA Lbr Len/2nd Weight Sex Delivery Anes PTL Lv  2 Current           1 Term 03/23/18 [id number] g F CS-LTranv Spinal  LIV     Birth Comments: cleft palate    Medical / Surgical History: Past medical history:  Past Medical History:  Diagnosis Date  . ADHD   . Depression     Past surgical history:  Past Surgical History:  Procedure Laterality Date  . BREAST REDUCTION SURGERY Bilateral 2006  . CESAREAN SECTION N/A 03/23/2018   Procedure: PRIMARY CESAREAN SECTION;  Surgeon: RoEverett GraffMD;  [place] [facility] [place];  Laterality: N/A;  RNFA Requested   Family History:  Family History  Problem Relation Age of Onset  . Hypothyroidism Mother   . Heart disease Maternal Grandfather   . Heart disease Paternal Grandfather   . Bipolar disorder Brother      Social History:  reports that she has never smoked. She has never used smokeless tobacco. She reports current alcohol use. She reports that she does not use drugs.  Allergies: Patient has no known allergies.   Current Medications at time of admission:  Prior to Admission medications   Medication Sig Start Date End Date Taking? Authorizing Provider  amphetamine-dextroamphetamine (ADDERALL) 10 MG tablet Take 1 tablet (10 mg total) by mouth 2 (two) times daily. 03/20/20   HoLucious GrovesDO  buPROPion (WELLBUTRIN XL) 300 MG 24 hr tablet Take 1 tablet (300 mg total) by mouth daily. 06/20/19 06/19/20  HoLucious GrovesDO  butalbital-acetaminophen-caffeine (FIORICET) [phone] tablet Take 2 tablets by mouth every 6 (six) hours as needed for headache. Patient not taking: Reported on 03/14/2020 12/18/19 12/17/20  HoLucious GrovesDO  erythromycin ophthalmic ointment Place 1 application into the left eye 3 (three) times daily. Patient not taking: Reported on 03/14/2020 01/30/20   HoLucious GrovesDO  fluticasone (FV[facility]50 MCG/ACT nasal spray Place 1 spray into both nostrils daily as needed for allergies or rhinitis.    [provider]  Prenatal Vit-Fe Fumarate-FA (PRENATAL MULTIVITAMIN) TABS tablet Take 1 tablet by mouth daily at 12 noon.    [provider]  traZODone (DESYREL)  100 MG tablet Take 1 tablet (100 mg total) by mouth at bedtime. 06/20/19   Lucious Groves, DO    Review of Systems: Constitutional: Negative   HENT: Negative   Eyes: Negative   Respiratory: Negative   Cardiovascular: Negative   Gastrointestinal: Negative  Genitourinary: neg for bloody show, neg for LOF   Musculoskeletal: Negative   Skin: Negative   Neurological: Negative   Endo/Heme/Allergies: Negative   Psychiatric/Behavioral: Negative    Physical Exam: VS: Blood pressure (!) 142/90, pulse 97, temperature 98.1 F (36.7 C), temperature source Oral, resp. rate 16, height 5' 2"  (1.575 m), weight 76.2  kg, last menstrual period 07/26/2019, SpO2 99 %, unknown if currently breastfeeding. AAO x3, no signs of distress Cardiovascular: RRR Respiratory: unlabored GU/GI: Abdomen gravid, non-tender, non-distended, active FM, vertex Extremities: no edema, negative for pain, tenderness, and cords  FHR: baseline rate 155 / variability moderate / accelerations present / absent decelerations TOCO: 2-3 min   Prenatal Transfer Tool  Maternal Diabetes: No Genetic Screening: Normal Maternal Ultrasounds/Referrals: Normal Fetal Ultrasounds or other Referrals:  None Maternal Substance Abuse:  No Significant Maternal Medications:  Meds include: Other: Fioricet, Adderall, Wellbutrin Significant Maternal Lab Results: none    Assessment: 29 y.o. G2P1001 [redacted]w[redacted]d Decreased AFI FHR category 1  Plan:  Direct admit to Antepartum Routine admission orders Amnisure BPP and AFI Antenatal steroids MFM consult Dr PAlwyn Peanotified of admission and plan of care  [name], [name] 04/03/2020 5:50 PM

## 2020-04-03 NOTE — Progress Notes (Signed)
Notified by RN of ctx Q 2-3 per TOCO after 500 ml IV bolus. Pt endorses mild abd cramping and tightening. Consulted w/ Dr. Alwyn Pea for management plan. Per Dr. Alwyn Pea, pt has been Rx Procardia for preterm ctx w/o cervical change.   S:  Mild abd cramping  O: FHR baseline 155/moderate/accels present/decels absent TOCO: 2-3.5 min Dilation: Closed Effacement (%): Thick Cervical Position: Posterior Station: -2 Exam by:: Burman Foster, [name]  A/P: Procardia 57m PO now followed by Procardia 10 mg in 30 min SVE May dc home if cervix is closed Out-pt BMZ injection on 8/19 AFI w/ MFM in 1 week  VBurman Foster MSN, [name] 04/03/2020 8:31 PM

## 2020-04-03 NOTE — H&P (Signed)
OB ADMISSION/ HISTORY & PHYSICAL:  Admission Date: 04/03/2020  4:03 PM  Admit Diagnosis: Decreased AFI  Catherine Chang is a 29 y.o. female G2P1001 37w0dpresent[place]. Endorses active FM, denies LOF and vaginal bleeding. Hx of primary C/S for breech presentation and plans rpt C/S w/ BTL. S/P viral gastroenteritis 2 days ago, denies S/S presently. Here for IV hydration, antenatal steroids, amnisure, and MFM U/S. Dr. PAlwyn Peaconsulted w/ Dr. BGertie Exon see note for MFM details and recommendation.    Patient Active Problem List   Diagnosis Date Noted   Amniotic fluid index decreased 04/03/2020   Oligohydramnios antepartum 04/03/2020   History of migraine with aura 04/03/2020   Depression 03/16/2019   ADHD 03/16/2019    Prenatal Labs: ABO, Rh: --/--/A POS (08/18 1749) Antibody: NEG (08/18 1749) Rubella:   immune RPR:   NR HBsAg:   NR HIV:   NR GTT: passed 1 hr GC/CHL: neg/neg Genetics: low-risk female    OB History  Gravida Para Term Preterm AB Living  2 1 1     1   SAB TAB Ectopic Multiple Live Births        0 1    # Outcome Date GA Lbr Len/2nd Weight Sex Delivery Anes PTL Lv  2 Current           1 Term 03/23/18 [id number] g F CS-LTranv Spinal  LIV     Birth Comments: cleft palate    Medical / Surgical History: Past medical history:  Past Medical History:  Diagnosis Date   ADHD    Depression     Past surgical history:  Past Surgical History:  Procedure Laterality Date   BREAST REDUCTION SURGERY Bilateral 2006   CESAREAN SECTION N/A 03/23/2018   Procedure: PRIMARY CESAREAN SECTION;  Surgeon: RoEverett GraffMD;  [place] [facility] Service: [place]s;  Laterality: N/A;  RNFA Requested   Family History:  Family History  Problem Relation Age of Onset   Hypothyroidism Mother    Heart disease Maternal Grandfather    Heart disease Paternal Grandfather    Bipolar disorder Brother      Social History:  reports that she has never smoked. She has never used smokeless tobacco. She reports current alcohol use. She reports that she does not use drugs.  Allergies: Patient has no known allergies.   Current Medications at time of admission:  Prior to Admission medications   Medication Sig Start Date End Date Taking? Authorizing Provider  amphetamine-dextroamphetamine (ADDERALL) 10 MG tablet Take 1 tablet (10 mg total) by mouth 2 (two) times daily. 03/20/20   HoLucious GrovesDO  buPROPion (WELLBUTRIN XL) 300 MG 24 hr tablet Take 1 tablet (300 mg total) by mouth daily. 06/20/19 06/19/20  HoLucious GrovesDO  butalbital-acetaminophen-caffeine (FIORICET) 50229-870-8332G tablet Take 2 tablets by mouth every 6 (six) hours as needed for headache. Patient not taking: Reported on 03/14/2020 12/18/19 12/17/20  HoLucious GrovesDO  erythromycin ophthalmic ointment Place 1 application into the left eye 3 (three) times daily. Patient not taking: Reported on 03/14/2020 01/30/20   HoLucious GrovesDO  fluticasone ([facility] MCG/ACT nasal spray Place 1 spray into both nostrils daily as needed for allergies or rhinitis.    [provider]  Prenatal Vit-Fe Fumarate-FA (PRENATAL MULTIVITAMIN) TABS tablet Take 1 tablet by mouth daily at 12 noon.    [provider]  traZODone (DESYREL)  100 MG tablet Take 1 tablet (100 mg total) by mouth at bedtime. 06/20/19   Lucious Groves, DO    Review of Systems: Constitutional: Negative   HENT: Negative   Eyes: Negative   Respiratory: Negative   Cardiovascular: Negative   Gastrointestinal: Negative  Genitourinary: neg for bloody show, neg for LOF   Musculoskeletal: Negative   Skin: Negative   Neurological: Negative   Endo/Heme/Allergies: Negative   Psychiatric/Behavioral: Negative    Physical Exam: VS: Blood pressure (!) 142/90, pulse 97, temperature 98.1 F (36.7 C), temperature source Oral, resp. rate 16, height 5' 2"  (1.575 m), weight 76.2  kg, last menstrual period 07/26/2019, SpO2 99 %, unknown if currently breastfeeding. AAO x3, no signs of distress Cardiovascular: RRR Respiratory: unlabored GU/GI: Abdomen gravid, non-tender, non-distended, active FM, vertex Extremities: no edema, negative for pain, tenderness, and cords  FHR: baseline rate 155 / variability moderate / accelerations present / absent decelerations TOCO: 2-3 min   Prenatal Transfer Tool  Maternal Diabetes: No Genetic Screening: Normal Maternal Ultrasounds/Referrals: Normal Fetal Ultrasounds or other Referrals:  None Maternal Substance Abuse:  No Significant Maternal Medications:  Meds include: Other: Fioricet, Adderall, Wellbutrin Significant Maternal Lab Results: none    Assessment: 29 y.o. G2P1001 [redacted]w[redacted]d Decreased AFI FHR category 1  Plan:  Direct admit to Antepartum Routine admission orders Amnisure BPP and AFI Antenatal steroids MFM consult Dr PAlwyn Peanotified of admission and plan of care  [name], [name] 04/03/2020 5:50 PM

## 2020-04-04 ENCOUNTER — Ambulatory Visit (INDEPENDENT_AMBULATORY_CARE_PROVIDER_SITE_OTHER): Payer: 59

## 2020-04-04 ENCOUNTER — Other Ambulatory Visit: Payer: Self-pay | Admitting: Internal Medicine

## 2020-04-04 DIAGNOSIS — O288 Other abnormal findings on antenatal screening of mother: Secondary | ICD-10-CM | POA: Diagnosis not present

## 2020-04-04 MED ORDER — BETAMETHASONE SOD PHOS & ACET 6 (3-3) MG/ML IJ SUSP
12.0000 mg | Freq: Once | INTRAMUSCULAR | Status: AC
  Administered 2020-04-04: 12 mg via INTRAMUSCULAR
  Filled 2020-04-04: qty 2

## 2020-04-04 MED FILL — ZOLPIDEM TARTRATE 5 MG TAB: 5 | 30 days supply | Qty: 30 | Fill #0

## 2020-04-04 NOTE — Progress Notes (Signed)
Spoke with Hoyle Sauer, went over her brief hospitalization yesterday (decreased amiotic fluid, pompted hospitalization-critical level, however repeat in hospital after IVF was improved but complicated by contractions) she was discharged last night.  She is due to second dose of betamethasone today at 5pm.  She requested it be given here since she is at the clinic today which would be fine.  She has follow up with her OB tomorrow for repeat eval and BP check.

## 2020-04-05 ENCOUNTER — Other Ambulatory Visit: Payer: Self-pay | Admitting: Obstetrics & Gynecology

## 2020-04-10 ENCOUNTER — Other Ambulatory Visit: Payer: Self-pay

## 2020-04-10 ENCOUNTER — Inpatient Hospital Stay (HOSPITAL_COMMUNITY): Payer: 59 | Admitting: Anesthesiology

## 2020-04-10 ENCOUNTER — Encounter (HOSPITAL_COMMUNITY): Payer: Self-pay | Admitting: Obstetrics & Gynecology

## 2020-04-10 ENCOUNTER — Inpatient Hospital Stay (HOSPITAL_COMMUNITY)
Admission: RE | Admit: 2020-04-10 | Discharge: 2020-04-12 | DRG: 784 | Disposition: A | Discharge status: Home / Self Care | Payer: 59 | Attending: Obstetrics & Gynecology | Admitting: Obstetrics & Gynecology

## 2020-04-10 ENCOUNTER — Encounter (HOSPITAL_COMMUNITY)
Admission: RE | Disposition: A | Discharge status: Home / Self Care | Payer: Self-pay | Source: Home / Self Care | Attending: Obstetrics & Gynecology

## 2020-04-10 DIAGNOSIS — Z98891 History of uterine scar from previous surgery: Secondary | ICD-10-CM

## 2020-04-10 DIAGNOSIS — O139 Gestational [pregnancy-induced] hypertension without significant proteinuria, unspecified trimester: Secondary | ICD-10-CM | POA: Diagnosis present

## 2020-04-10 DIAGNOSIS — Z23 Encounter for immunization: Secondary | ICD-10-CM

## 2020-04-10 DIAGNOSIS — O4103X1 Oligohydramnios, third trimester, fetus 1: Secondary | ICD-10-CM | POA: Diagnosis not present

## 2020-04-10 DIAGNOSIS — Z9851 Tubal ligation status: Secondary | ICD-10-CM

## 2020-04-10 DIAGNOSIS — O34211 Maternal care for low transverse scar from previous cesarean delivery: Secondary | ICD-10-CM | POA: Diagnosis present

## 2020-04-10 DIAGNOSIS — O134 Gestational [pregnancy-induced] hypertension without significant proteinuria, complicating childbirth: Secondary | ICD-10-CM | POA: Diagnosis not present

## 2020-04-10 DIAGNOSIS — Z3A37 37 weeks gestation of pregnancy: Secondary | ICD-10-CM

## 2020-04-10 DIAGNOSIS — O133 Gestational [pregnancy-induced] hypertension without significant proteinuria, third trimester: Secondary | ICD-10-CM | POA: Diagnosis not present

## 2020-04-10 DIAGNOSIS — O4103X0 Oligohydramnios, third trimester, not applicable or unspecified: Secondary | ICD-10-CM | POA: Diagnosis present

## 2020-04-10 DIAGNOSIS — Z302 Encounter for sterilization: Secondary | ICD-10-CM

## 2020-04-10 DIAGNOSIS — O34219 Maternal care for unspecified type scar from previous cesarean delivery: Secondary | ICD-10-CM | POA: Diagnosis not present

## 2020-04-10 HISTORY — DX: History of uterine scar from previous surgery: Z98.891

## 2020-04-10 HISTORY — PX: TUBAL LIGATION: SHX77

## 2020-04-10 LAB — COMPREHENSIVE METABOLIC PANEL
ALT: 15 U/L (ref 0–44)
AST: 68 U/L — ABNORMAL HIGH (ref 15–41)
Albumin: 3.1 g/dL — ABNORMAL LOW (ref 3.5–5.0)
Alkaline Phosphatase: 308 U/L — ABNORMAL HIGH (ref 38–126)
Anion gap: 15 (ref 5–15)
BUN: 13 mg/dL (ref 6–20)
CO2: 14 mmol/L — ABNORMAL LOW (ref 22–32)
Calcium: 9.2 mg/dL (ref 8.9–10.3)
Chloride: 104 mmol/L (ref 98–111)
Creatinine, Ser: 0.69 mg/dL (ref 0.44–1.00)
GFR calc Af Amer: >60 mL/min (ref >60)
GFR calc non Af Amer: >60 mL/min (ref >60)
Glucose, Bld: 73 mg/dL (ref 70–99)
Potassium: 5.8 mmol/L — ABNORMAL HIGH (ref 3.5–5.1)
Sodium: 133 mmol/L — ABNORMAL LOW (ref 135–145)
Total Bilirubin: 1.9 mg/dL — ABNORMAL HIGH (ref 0.3–1.2)
Total Protein: 6.1 g/dL — ABNORMAL LOW (ref 6.5–8.1)

## 2020-04-10 LAB — CBC
HCT: 41.8 % (ref 36.0–46.0)
Hemoglobin: 13.8 g/dL (ref 12.0–15.0)
MCH: 30.5 pg (ref 26.0–34.0)
MCHC: 33 g/dL (ref 30.0–36.0)
MCV: 92.5 fL (ref 80.0–100.0)
Platelets: 354 10*3/uL (ref 150–400)
RBC: 4.52 MIL/uL (ref 3.87–5.11)
RDW: 13.1 % (ref 11.5–15.5)
WBC: 19.7 10*3/uL — ABNORMAL HIGH (ref 4.0–10.5)
nRBC: 0 % (ref 0.0–0.2)

## 2020-04-10 LAB — TYPE AND SCREEN
ABO/RH(D): A POS
Antibody Screen: NEGATIVE

## 2020-04-10 SURGERY — Surgical Case
Anesthesia: Spinal

## 2020-04-10 MED ORDER — PHENYLEPHRINE HCL-NACL 20-0.9 MG/250ML-% IV SOLN
INTRAVENOUS | Status: AC
  Filled 2020-04-10: qty 250

## 2020-04-10 MED ORDER — OXYTOCIN-SODIUM CHLORIDE 30-0.9 UT/500ML-% IV SOLN
INTRAVENOUS | Status: DC | PRN
  Administered 2020-04-10: 300 mL via INTRAVENOUS

## 2020-04-10 MED ORDER — NALBUPHINE HCL 10 MG/ML IJ SOLN: 5.0000 mg | Freq: Once | INTRAMUSCULAR | Status: DC | PRN

## 2020-04-10 MED ORDER — OXYCODONE HCL 5 MG PO TABS
5.0000 mg | ORAL_TABLET | ORAL | Status: DC | PRN
  Administered 2020-04-10 – 2020-04-11 (×2): 5 mg via ORAL
  Administered 2020-04-11 – 2020-04-12 (×5): 10 mg via ORAL
  Filled 2020-04-10: qty 1
  Filled 2020-04-10: qty 2
  Filled 2020-04-10: qty 1
  Filled 2020-04-10 (×4): qty 2

## 2020-04-10 MED ORDER — KETOROLAC TROMETHAMINE 30 MG/ML IJ SOLN: 30.0000 mg | Freq: Four times a day (QID) | INTRAMUSCULAR | Status: AC | PRN

## 2020-04-10 MED ORDER — STERILE WATER FOR IRRIGATION IR SOLN
Status: DC | PRN
  Administered 2020-04-10: 1

## 2020-04-10 MED ORDER — MENTHOL 3 MG MT LOZG: 1.0000 | LOZENGE | OROMUCOSAL | Status: DC | PRN

## 2020-04-10 MED ORDER — DIPHENHYDRAMINE HCL 25 MG PO CAPS: 25.0000 mg | ORAL_CAPSULE | ORAL | Status: DC | PRN

## 2020-04-10 MED ORDER — FENTANYL CITRATE (PF) 100 MCG/2ML IJ SOLN
INTRAMUSCULAR | Status: DC | PRN
  Administered 2020-04-10: 15 ug via INTRAVENOUS

## 2020-04-10 MED ORDER — LACTATED RINGERS IV SOLN: INTRAVENOUS | Status: DC

## 2020-04-10 MED ORDER — COCONUT OIL OIL
1.0000 "application " | TOPICAL_OIL | Status: DC | PRN
  Administered 2020-04-11: 1 via TOPICAL

## 2020-04-10 MED ORDER — KETOROLAC TROMETHAMINE 30 MG/ML IJ SOLN
30.0000 mg | Freq: Four times a day (QID) | INTRAMUSCULAR | Status: AC
  Administered 2020-04-10 – 2020-04-11 (×2): 30 mg via INTRAVENOUS
  Filled 2020-04-10 (×3): qty 1

## 2020-04-10 MED ORDER — ACETAMINOPHEN 500 MG PO TABS: 1000.0000 mg | ORAL_TABLET | Freq: Four times a day (QID) | ORAL | Status: DC

## 2020-04-10 MED ORDER — PHENYLEPHRINE HCL-NACL 20-0.9 MG/250ML-% IV SOLN
INTRAVENOUS | Status: DC | PRN
  Administered 2020-04-10: 60 ug/min via INTRAVENOUS

## 2020-04-10 MED ORDER — BUPIVACAINE HCL (PF) 0.5 % IJ SOLN
INTRAMUSCULAR | Status: AC
  Filled 2020-04-10: qty 30

## 2020-04-10 MED ORDER — TETANUS-DIPHTH-ACELL PERTUSSIS 5-2.5-18.5 LF-MCG/0.5 IM SUSP
0.5000 mL | Freq: Once | INTRAMUSCULAR | Status: AC
  Administered 2020-04-11: 0.5 mL via INTRAMUSCULAR
  Filled 2020-04-10: qty 0.5

## 2020-04-10 MED ORDER — DIBUCAINE (PERIANAL) 1 % EX OINT: 1.0000 "application " | TOPICAL_OINTMENT | CUTANEOUS | Status: DC | PRN

## 2020-04-10 MED ORDER — NALOXONE HCL 0.4 MG/ML IJ SOLN: 0.4000 mg | INTRAMUSCULAR | Status: DC | PRN

## 2020-04-10 MED ORDER — NALBUPHINE HCL 10 MG/ML IJ SOLN: 5.0000 mg | INTRAMUSCULAR | Status: DC | PRN

## 2020-04-10 MED ORDER — SENNOSIDES-DOCUSATE SODIUM 8.6-50 MG PO TABS
2.0000 | ORAL_TABLET | ORAL | Status: DC
  Administered 2020-04-10 – 2020-04-11 (×2): 2 via ORAL
  Filled 2020-04-10 (×2): qty 2

## 2020-04-10 MED ORDER — SODIUM CHLORIDE 0.9 % IR SOLN
Status: DC | PRN
  Administered 2020-04-10: 1

## 2020-04-10 MED ORDER — FENTANYL CITRATE (PF) 100 MCG/2ML IJ SOLN
INTRAMUSCULAR | Status: AC
  Filled 2020-04-10: qty 2

## 2020-04-10 MED ORDER — OXYTOCIN-SODIUM CHLORIDE 30-0.9 UT/500ML-% IV SOLN
2.5000 [IU]/h | INTRAVENOUS | Status: AC
  Administered 2020-04-11: 2.5 [IU]/h via INTRAVENOUS
  Filled 2020-04-10: qty 500

## 2020-04-10 MED ORDER — MORPHINE SULFATE (PF) 0.5 MG/ML IJ SOLN
INTRAMUSCULAR | Status: DC | PRN
  Administered 2020-04-10: 150 ug via EPIDURAL

## 2020-04-10 MED ORDER — POVIDONE-IODINE 10 % EX SWAB: 2.0000 "application " | Freq: Once | CUTANEOUS | Status: DC

## 2020-04-10 MED ORDER — ZOLPIDEM TARTRATE 5 MG PO TABS: 5.0000 mg | ORAL_TABLET | Freq: Every evening | ORAL | Status: DC | PRN

## 2020-04-10 MED ORDER — FENTANYL CITRATE (PF) 100 MCG/2ML IJ SOLN
25.0000 ug | INTRAMUSCULAR | Status: DC | PRN
  Administered 2020-04-10: 25 ug via INTRAVENOUS
  Administered 2020-04-10: 50 ug via INTRAVENOUS

## 2020-04-10 MED ORDER — HYDROMORPHONE HCL 1 MG/ML IJ SOLN: 0.2000 mg | INTRAMUSCULAR | Status: DC | PRN

## 2020-04-10 MED ORDER — CEFAZOLIN SODIUM-DEXTROSE 2-4 GM/100ML-% IV SOLN
INTRAVENOUS | Status: AC
  Filled 2020-04-10: qty 100

## 2020-04-10 MED ORDER — SIMETHICONE 80 MG PO CHEW: 80.0000 mg | CHEWABLE_TABLET | ORAL | Status: DC | PRN

## 2020-04-10 MED ORDER — CHLORHEXIDINE GLUCONATE 0.12 % MT SOLN
OROMUCOSAL | Status: AC
  Filled 2020-04-10: qty 15

## 2020-04-10 MED ORDER — ONDANSETRON HCL 4 MG/2ML IJ SOLN
INTRAMUSCULAR | Status: DC | PRN
  Administered 2020-04-10: 4 mg via INTRAVENOUS

## 2020-04-10 MED ORDER — SCOPOLAMINE 1 MG/3DAYS TD PT72
MEDICATED_PATCH | TRANSDERMAL | Status: AC
  Filled 2020-04-10: qty 1

## 2020-04-10 MED ORDER — IBUPROFEN 800 MG PO TABS
800.0000 mg | ORAL_TABLET | Freq: Four times a day (QID) | ORAL | Status: DC
  Administered 2020-04-11 – 2020-04-12 (×3): 800 mg via ORAL
  Filled 2020-04-10 (×3): qty 1

## 2020-04-10 MED ORDER — NALOXONE HCL 4 MG/10ML IJ SOLN
1.0000 ug/kg/h | INTRAVENOUS | Status: DC | PRN
  Filled 2020-04-10: qty 5

## 2020-04-10 MED ORDER — KETOROLAC TROMETHAMINE 30 MG/ML IJ SOLN
INTRAMUSCULAR | Status: AC
  Filled 2020-04-10: qty 1

## 2020-04-10 MED ORDER — MORPHINE SULFATE (PF) 0.5 MG/ML IJ SOLN
INTRAMUSCULAR | Status: AC
  Filled 2020-04-10: qty 10

## 2020-04-10 MED ORDER — SCOPOLAMINE 1 MG/3DAYS TD PT72
1.0000 | MEDICATED_PATCH | Freq: Once | TRANSDERMAL | Status: DC
  Administered 2020-04-10: 1.5 mg via TRANSDERMAL

## 2020-04-10 MED ORDER — SODIUM CHLORIDE 0.9% FLUSH: 3.0000 mL | INTRAVENOUS | Status: DC | PRN

## 2020-04-10 MED ORDER — SCOPOLAMINE 1 MG/3DAYS TD PT72: 1.0000 | MEDICATED_PATCH | Freq: Once | TRANSDERMAL | Status: DC

## 2020-04-10 MED ORDER — ORAL CARE MOUTH RINSE: 15.0000 mL | Freq: Once | OROMUCOSAL | Status: AC

## 2020-04-10 MED ORDER — CHLORHEXIDINE GLUCONATE 0.12 % MT SOLN
15.0000 mL | Freq: Once | OROMUCOSAL | Status: AC
  Administered 2020-04-10: 15 mL via OROMUCOSAL

## 2020-04-10 MED ORDER — OXYTOCIN-SODIUM CHLORIDE 30-0.9 UT/500ML-% IV SOLN
INTRAVENOUS | Status: AC
  Filled 2020-04-10: qty 500

## 2020-04-10 MED ORDER — SOD CITRATE-CITRIC ACID 500-334 MG/5ML PO SOLN
30.0000 mL | Freq: Once | ORAL | Status: AC
  Administered 2020-04-10: 30 mL via ORAL

## 2020-04-10 MED ORDER — SOD CITRATE-CITRIC ACID 500-334 MG/5ML PO SOLN
ORAL | Status: AC
  Filled 2020-04-10: qty 30

## 2020-04-10 MED ORDER — SIMETHICONE 80 MG PO CHEW
80.0000 mg | CHEWABLE_TABLET | ORAL | Status: DC
  Administered 2020-04-10 – 2020-04-11 (×2): 80 mg via ORAL
  Filled 2020-04-10 (×2): qty 1

## 2020-04-10 MED ORDER — WITCH HAZEL-GLYCERIN EX PADS: 1.0000 "application " | MEDICATED_PAD | CUTANEOUS | Status: DC | PRN

## 2020-04-10 MED ORDER — ACETAMINOPHEN 500 MG PO TABS
1000.0000 mg | ORAL_TABLET | Freq: Four times a day (QID) | ORAL | Status: DC
  Administered 2020-04-10 – 2020-04-12 (×6): 1000 mg via ORAL
  Filled 2020-04-10 (×6): qty 2

## 2020-04-10 MED ORDER — DEXAMETHASONE SODIUM PHOSPHATE 4 MG/ML IJ SOLN
INTRAMUSCULAR | Status: DC | PRN
  Administered 2020-04-10: 4 mg via INTRAVENOUS

## 2020-04-10 MED ORDER — ONDANSETRON HCL 4 MG/2ML IJ SOLN: 4.0000 mg | Freq: Three times a day (TID) | INTRAMUSCULAR | Status: DC | PRN

## 2020-04-10 MED ORDER — DIPHENHYDRAMINE HCL 50 MG/ML IJ SOLN: 12.5000 mg | INTRAMUSCULAR | Status: DC | PRN

## 2020-04-10 MED ORDER — SOD CITRATE-CITRIC ACID 500-334 MG/5ML PO SOLN: 30.0000 mL | Freq: Once | ORAL | Status: DC

## 2020-04-10 MED ORDER — PRENATAL MULTIVITAMIN CH
1.0000 | ORAL_TABLET | Freq: Every day | ORAL | Status: DC
  Administered 2020-04-11: 1 via ORAL
  Filled 2020-04-10: qty 1

## 2020-04-10 MED ORDER — PROMETHAZINE HCL 25 MG/ML IJ SOLN: 6.2500 mg | INTRAMUSCULAR | Status: DC | PRN

## 2020-04-10 MED ORDER — DEXAMETHASONE SODIUM PHOSPHATE 4 MG/ML IJ SOLN
INTRAMUSCULAR | Status: AC
  Filled 2020-04-10: qty 1

## 2020-04-10 MED ORDER — DIPHENHYDRAMINE HCL 25 MG PO CAPS
25.0000 mg | ORAL_CAPSULE | Freq: Four times a day (QID) | ORAL | Status: DC | PRN
  Administered 2020-04-11: 25 mg via ORAL
  Filled 2020-04-10: qty 1

## 2020-04-10 MED ORDER — MEPERIDINE HCL 25 MG/ML IJ SOLN: 6.2500 mg | INTRAMUSCULAR | Status: DC | PRN

## 2020-04-10 MED ORDER — KETOROLAC TROMETHAMINE 30 MG/ML IJ SOLN
30.0000 mg | Freq: Four times a day (QID) | INTRAMUSCULAR | Status: AC | PRN
  Administered 2020-04-10: 30 mg via INTRAVENOUS

## 2020-04-10 MED ORDER — ONDANSETRON HCL 4 MG/2ML IJ SOLN
INTRAMUSCULAR | Status: AC
  Filled 2020-04-10: qty 2

## 2020-04-10 MED ORDER — CEFAZOLIN SODIUM-DEXTROSE 2-4 GM/100ML-% IV SOLN
2.0000 g | INTRAVENOUS | Status: AC
  Administered 2020-04-10: 2 g via INTRAVENOUS

## 2020-04-10 MED ORDER — BUPIVACAINE IN DEXTROSE 0.75-8.25 % IT SOLN
INTRATHECAL | Status: DC | PRN
  Administered 2020-04-10: 1.6 mL via INTRATHECAL

## 2020-04-10 MED ORDER — SIMETHICONE 80 MG PO CHEW
80.0000 mg | CHEWABLE_TABLET | Freq: Three times a day (TID) | ORAL | Status: DC
  Administered 2020-04-10 – 2020-04-12 (×5): 80 mg via ORAL
  Filled 2020-04-10 (×5): qty 1

## 2020-04-10 MED ORDER — BUPIVACAINE HCL 0.5 % IJ SOLN
INTRAMUSCULAR | Status: DC | PRN
  Administered 2020-04-10: 30 mL

## 2020-04-10 SURGICAL SUPPLY — 37 items
BARRIER ADHS 3X4 INTERCEED (GAUZE/BANDAGES/DRESSINGS) ×3 IMPLANT
BENZOIN TINCTURE PRP APPL 2/3 (GAUZE/BANDAGES/DRESSINGS) ×3 IMPLANT
CHLORAPREP W/TINT 26ML (MISCELLANEOUS) ×3 IMPLANT
CLAMP CORD UMBIL (MISCELLANEOUS) IMPLANT
CLOSURE STERI STRIP 1/2 X4 (GAUZE/BANDAGES/DRESSINGS) ×3 IMPLANT
CLOTH BEACON ORANGE TIMEOUT ST (SAFETY) ×3 IMPLANT
DRSG OPSITE POSTOP 4X10 (GAUZE/BANDAGES/DRESSINGS) ×3 IMPLANT
ELECT REM PT RETURN 9FT ADLT (ELECTROSURGICAL) ×3
ELECTRODE REM PT RTRN 9FT ADLT (ELECTROSURGICAL) ×2 IMPLANT
EXTRACTOR VACUUM KIWI (MISCELLANEOUS) IMPLANT
GLOVE BIO SURGEON STRL SZ 6.5 (GLOVE) ×3 IMPLANT
GLOVE BIOGEL PI IND STRL 7.0 (GLOVE) ×4 IMPLANT
GLOVE BIOGEL PI INDICATOR 7.0 (GLOVE) ×2
GOWN STRL REUS W/TWL LRG LVL3 (GOWN DISPOSABLE) ×9 IMPLANT
KIT ABG SYR 3ML LUER SLIP (SYRINGE) IMPLANT
LIGASURE IMPACT 36 18CM CVD LR (INSTRUMENTS) ×3 IMPLANT
NEEDLE HYPO 22GX1.5 SAFETY (NEEDLE) IMPLANT
NEEDLE HYPO 25X5/8 SAFETYGLIDE (NEEDLE) IMPLANT
NS IRRIG 1000ML POUR BTL (IV SOLUTION) ×3 IMPLANT
PACK C SECTION WH (CUSTOM PROCEDURE TRAY) ×3 IMPLANT
PAD OB MATERNITY 4.3X12.25 (PERSONAL CARE ITEMS) ×3 IMPLANT
PENCIL SMOKE EVAC W/HOLSTER (ELECTROSURGICAL) ×3 IMPLANT
RTRCTR C-SECT PINK 25CM LRG (MISCELLANEOUS) ×3 IMPLANT
STRIP CLOSURE SKIN 1/2X4 (GAUZE/BANDAGES/DRESSINGS) ×3 IMPLANT
SUT CHROMIC 2 0 CT 1 (SUTURE) ×6 IMPLANT
SUT MNCRL 0 VIOLET CTX 36 (SUTURE) ×4 IMPLANT
SUT MONOCRYL 0 CTX 36 (SUTURE) ×2
SUT PDS AB 0 CTX 60 (SUTURE) ×6 IMPLANT
SUT PLAIN 2 0 (SUTURE)
SUT PLAIN ABS 2-0 CT1 27XMFL (SUTURE) IMPLANT
SUT VIC AB 0 CTX 36 (SUTURE) ×2
SUT VIC AB 0 CTX36XBRD ANBCTRL (SUTURE) ×4 IMPLANT
SUT VIC AB 4-0 KS 27 (SUTURE) ×3 IMPLANT
SYR CONTROL 10ML LL (SYRINGE) IMPLANT
TOWEL OR 17X24 6PK STRL BLUE (TOWEL DISPOSABLE) ×3 IMPLANT
TRAY FOLEY W/BAG SLVR 14FR LF (SET/KITS/TRAYS/PACK) ×3 IMPLANT
WATER STERILE IRR 1000ML POUR (IV SOLUTION) ×3 IMPLANT

## 2020-04-10 NOTE — H&P (Signed)
Catherine Chang is a 29 y.o. female presenting for repeat cesarean section at 37 weeks for both oligohydramnios and gestational hypertension.  Patient has been contracting for most of the pregnancy and was treated for preterm labor.   She is s/p Betamethasone for fetal lung maturity.  Patient currently notes that her contractions have worsened overnight, she denies leaking of fluid, no vaginal bleeding.  She reports good fetal movement.  Her blood pressures in the past two weeks have been mild range, she has history of migraines during this pregnancy.  She presently denies visual changes, no RUQ pain.    OB History    Gravida  2   Para  1   Term  1   Preterm      AB      Living  1     SAB      TAB      Ectopic      Multiple  0   Live Births  1          Past Medical History:  Diagnosis Date  . ADHD   . Depression    Past Surgical History:  Procedure Laterality Date  . BREAST REDUCTION SURGERY Bilateral 2006  . CESAREAN SECTION N/A 03/23/2018   Procedure: PRIMARY CESAREAN SECTION;  Surgeon: Everett Graff, MD;  Location: [place];  Service: Obstetrics;  Laterality: N/A;  RNFA Requested   Family History: family history includes Bipolar disorder in her brother; Heart disease in her maternal grandfather and paternal grandfather; Hypothyroidism in her mother. Social History:  reports that she has never smoked. She has never used smokeless tobacco. She reports current alcohol use. She reports that she does not use drugs.     Maternal Diabetes: No Genetic Screening: Normal Maternal Ultrasounds/Referrals: Normal Fetal Ultrasounds or other Referrals:  Referred to Materal Fetal Medicine  Maternal Substance Abuse:  No Significant Maternal Medications:  Meds include: Other:  Fusion plus, Excedrin PRN, PNV Significant Maternal Lab Results:  Group B Strep negative Other Comments:  None  Review of Systems  All other systems reviewed and are negative.   As per  HPI    Blood pressure (!) 127/98, pulse 85, temperature 98.3 F (36.8 C), temperature source Oral, resp. rate 16, height 5' 2"  (1.575 m), weight 76.2 kg, last menstrual period 07/26/2019, unknown if currently breastfeeding.  Physical Exam Vitals and nursing note reviewed.  Genitourinary:    General: Normal vulva.     Comments: Cervix not examined   IMAGING:  Last Obstetric biophysical profile in office 04/03/20: 6/8 AFI 2.7cm.  Follow up US by MFM AFI 7  Prenatal labs: ABO, Rh: --/--/PENDING (08/25 1235) Antibody: PENDING (08/25 1235) Rubella:  Immune RPR:   NR HBsAg:   NEG HIV:   NEG GBS:   NEG  Assessment/Plan: 29 year old G2P1 at 37 weeks on call to OR for repeat cesarean section (cleared by Dr. Gertie Exon, MFM) For gestational hypertension as well as oligohydramnios.  Patient also desires permanent sterilization.   We reviewed the risks of the procedure including hemorrhage, infection, injury to vessels and organs, baby.  Patient voiced understanding and agrees to proceed.  Consent signed, witnessed and placed into chart.  Ancef on call to OR.    Catherine Chang 04/10/2020, 12:58 PM

## 2020-04-10 NOTE — Transfer of Care (Signed)
Immediate Anesthesia Transfer of Care Note  Patient: Jodean Lima  Procedure(s) Performed: CESAREAN SECTION (N/A ) BILATERAL TUBAL LIGATION (Bilateral )  Patient Location: PACU  Anesthesia Type:Spinal  Level of Consciousness: awake, alert  and oriented  Airway & Oxygen Therapy: Patient Spontanous Breathing  Post-op Assessment: Report given to RN and Post -op Vital signs reviewed and stable  Post vital signs: Reviewed and stable  Last Vitals:  Vitals Value Taken Time  BP 133/114 04/10/20 1517  Temp    Pulse 72 04/10/20 1519  Resp 15 04/10/20 1519  SpO2 94 % 04/10/20 1519  Vitals shown include unvalidated device data.  Last Pain:  Vitals:   04/10/20 1245  TempSrc: Oral         Complications: No complications documented.

## 2020-04-10 NOTE — Op Note (Signed)
Cesarean Section Procedure Note  Catherine Chang  DOB:    08/17/1991  MRN:    [id number]  Date of Surgery:  04/10/2020   Indications:           previous uterine incision kerr x1; Oligohydramnios, gestational hypertension.   Pre-operative Diagnosis: 37 week 0 day pregnancy, prior Low transverse cesarean             Section with oligohydramnios and gestational hypertension  Post-operative Diagnosis: same  Surgeon: Caffie Damme   Assistants: None  Anesthesia: Spinal anesthesia  ASA Class: 2  Procedure Details   The patient was counseled about the risks, benefits, complications of the cesarean section. The patient concurred with the proposed plan, giving informed consent.  The site of surgery properly noted/marked. The patient was taken to Operating Room B, identified as Catherine Chang and the procedure verified as C-Section Delivery. A Time Out was held and the above information confirmed.  After spinal was found to adequate , the patient was placed in the dorsal supine position with a leftward tilt, draped and prepped in the usual sterile manner. A Pfannenstiel incision was made with the 10 blade scalpel and carried down through the subcutaneous tissue to the fascia.  The fascia was incised in the midline and the fascial incision was extended laterally with Mayo scissors. The superior aspect of the fascial incision was grasped with Coker clamps x2, tented up and the rectus muscles dissected off sharply with [employer]. The rectus was then dissected off with blunt dissection and Mayo scissors inferiorly. The rectus muscles were separated in the midline. The abdominal peritoneum was identified, tented up between two hemostats, entered sharply using Metzenbaum scissors, and the incision was extended superiorly and inferiorly with good visualization of the bladder. The Alexis retractor was then deployed. The vesicouterine peritoneum was identified, tented up, entered sharply  with Metzenbaum scissors, and the bladder flap was created digitally. Scalpel was then used to make a low transverse incision on the uterus which was extended laterally with  blunt dissection. The fetal vertex was identified, the head was brought out from the pelvis.  The head was then delivered easily through the uterine incision followed by the body. The A live female infant was bulb suctioned on the operative field cried vigorously, cord was clamped and cut and the infant was passed to the waiting neonatology nurse. Apgars 9/9. Placenta was then delivered spontaneously, intact and appear normal, the uterus was cleared of all clot and debris. The uterine incision was repaired with #0 Monocryl in running locked fashion. A second imbricating suture was performed using the same suture. The incision was oozing from the left side and this was alleviated using running, locked suture of 0- vicryl. The abdominal cavity was cleared of all clot and debris. Sterile saline was utilized for irrigation.  Interceed was placed over the incision.   Ovaries and tubes were inspected and normal.Attention was then turned to the fallopian tubes.  The left fallopian tube was traced to the fimbriated end and held with the [facility].  The Ligasure Impact was used to cauterize the mesosalpinx and transect off the tubal segment from the uterus.  The tube was handed off the field to be sent to Pathology.  The above was done for the contralateral right fallopian tube.  The Alexis retractor was removed. The abdominal peritoneum was reapproximated with 2-0 chromic  in a running fashion, the rectus muscles was reapproximated with #2 chromic in interrupted fashion. The  fascia was closed with looped 0 PDS in a running fashion. The subcuticular layer was irrigated and all bleeders cauterized.    The incision was injected with 25 mL of 0.5% Marcaine. Interrupted sutures of 2-0 plain were used to re-approximate Scarpas fascia.  The skin was closed  with 4-0 vicryl in a subcuticular fashion using a Lanny Hurst needle. The incision was dressed with benzoine, steri strips and a honeycomb dressing. All sponge lap and needle counts were correct x3 (instrument, sponge, and needle counts were correct prior the abdominal closure and at the conclusion of the case.) Patient tolerated the procedure well and recovered in stable condition following the procedure.    Findings: Live female infant, Apgars 9/9, clear amniotic fluid, normal appearing placenta, normal uterus, bilateral tubes and ovaries  Estimated Blood Loss: 156 mL  IVF: 2300 mL         Drains: Foley catheter  Urine output: 100 mL clear urine         Specimens: Placenta to L&D; Bilateral fallopian tubes to Pathology         Implants: none         Complications:  None; patient tolerated the procedure well.         Disposition: PACU - hemodynamically stable.   Rogelio Seen Maribeth Jiles

## 2020-04-10 NOTE — Anesthesia Preprocedure Evaluation (Addendum)
Anesthesia Evaluation  Patient identified by MRN, date of birth, ID band  Reviewed: Allergy & Precautions, NPO status , Patient's Chart, lab work & pertinent test results  Airway Mallampati: I  TM Distance: >3 FB Neck ROM: Full    Dental no notable dental hx.    Pulmonary neg pulmonary ROS,    Pulmonary exam normal breath sounds clear to auscultation       Cardiovascular Exercise Tolerance: Good negative cardio ROS Normal cardiovascular exam Rhythm:Regular Rate:Normal     Neuro/Psych PSYCHIATRIC DISORDERS Depression    GI/Hepatic negative GI ROS, Neg liver ROS,   Endo/Other  negative endocrine ROS  Renal/GU negative Renal ROS  negative genitourinary   Musculoskeletal negative musculoskeletal ROS (+)   Abdominal   Peds  Hematology negative hematology ROS (+)   Anesthesia Other Findings   Reproductive/Obstetrics (+) Pregnancy                            Anesthesia Physical Anesthesia Plan  ASA: II  Anesthesia Plan: Spinal   Post-op Pain Management:    Induction:   PONV Risk Score and Plan: 2 and Scopolamine patch - Pre-op and Ondansetron  Airway Management Planned: Nasal Cannula and Natural Airway  Additional Equipment:   Intra-op Plan:   Post-operative Plan:   Informed Consent: I have reviewed the patients History and Physical, chart, labs and discussed the procedure including the risks, benefits and alternatives for the proposed anesthesia with the patient or authorized representative who has indicated his/her understanding and acceptance.       Plan Discussed with:   Anesthesia Plan Comments:         Anesthesia Quick Evaluation

## 2020-04-11 DIAGNOSIS — Z9851 Tubal ligation status: Secondary | ICD-10-CM

## 2020-04-11 DIAGNOSIS — O139 Gestational [pregnancy-induced] hypertension without significant proteinuria, unspecified trimester: Secondary | ICD-10-CM

## 2020-04-11 HISTORY — DX: Tubal ligation status: Z98.51

## 2020-04-11 HISTORY — DX: Gestational (pregnancy-induced) hypertension without significant proteinuria, unspecified trimester: O13.9

## 2020-04-11 LAB — SURGICAL PATHOLOGY

## 2020-04-11 LAB — CBC
HCT: 33.8 % — ABNORMAL LOW (ref 36.0–46.0)
Hemoglobin: 11.3 g/dL — ABNORMAL LOW (ref 12.0–15.0)
MCH: 31.2 pg (ref 26.0–34.0)
MCHC: 33.4 g/dL (ref 30.0–36.0)
MCV: 93.4 fL (ref 80.0–100.0)
Platelets: 251 10*3/uL (ref 150–400)
RBC: 3.62 MIL/uL — ABNORMAL LOW (ref 3.87–5.11)
RDW: 12.9 % (ref 11.5–15.5)
WBC: 23.5 10*3/uL — ABNORMAL HIGH (ref 4.0–10.5)
nRBC: 0 % (ref 0.0–0.2)

## 2020-04-11 LAB — RPR: RPR Ser Ql: NONREACTIVE

## 2020-04-11 NOTE — Progress Notes (Signed)
Subjective: POD# 1 Information for the patient's newborn:  Catherine, Chang Girl Katiria [[id number]]  female    8 Name [facility] Circumcision N/A  Reports feeling good Feeding: breast Reports tolerating PO and denies N/V, foley to be removed this AM Pain controlled with PO meds Denies HA/SOB/dizziness  Flatus not passing Vaginal bleeding is normal, no clots     Objective:  VS:  Vitals:   04/10/20 2151 04/10/20 2154 04/10/20 2350 04/11/20 0425  BP: 116/73 118/79 116/70 115/70  Pulse: 63 64 (!) 58 62  Resp:   18 18  Temp:   98.5 F (36.9 C) 98.2 F (36.8 C)  TempSrc:   Oral Oral  SpO2:   98% 100%  Weight:      Height:        Intake/Output Summary (Last 24 hours) at 04/11/2020 0604 Last data filed at 04/10/2020 2200 Gross per 24 hour  Intake 1761.7 ml  Output 1056 ml  Net 705.7 ml     Recent Labs    04/10/20 1221 04/11/20 0450  WBC 19.7* 23.5*  HGB 13.8 11.3*  HCT 41.8 33.8*  PLT 354 251    Blood type: --/--/A POS (08/25 1235) Rubella:   immune   Physical Exam:  General: alert, cooperative and no distress CV: normal pulses in all ext Resp: unlabored Abdomen: soft, nontender Incision: serous drainage present on >50 of dressing Perineum:  Uterine Fundus: firm, below umbilicus, nontender Lochia: appropriate Ext: no edema, neg for pain, tenderness, or cords   Assessment/Plan: 29 y.o.   POD# 1. [id number]                  Active Problems:   Status post repeat low transverse cesarean section GHTN    -normotensive Permanent sterilization  Routine post-op PP care          Advance diet as tolerated Advised warm fluids and ambulation to improve GI motility Encourage rest when baby rests Breastfeeding support Anticipate D/C 04/12/20   Arrie Eastern, MSN, [name] 04/11/2020, 6:04 AM

## 2020-04-11 NOTE — Lactation Note (Signed)
This note was copied from a baby's chart. Lactation Consultation Note  Patient Name: Catherine Chang Date: 04/11/2020  P1, 26 hours ETI female infant. Per RN Catherine Chang) mom doesn't want [place] services at this time.    Maternal Data    Feeding Feeding Type: Breast Fed  LATCH Score                   Interventions    Lactation Tools Discussed/Used     Consult Status      Catherine Chang 04/11/2020, 4:46 PM

## 2020-04-11 NOTE — Lactation Note (Signed)
This note was copied from a baby's chart. Lactation Consultation Note  Patient Name: Catherine Chang [id number] Date: 04/11/2020 Reason for consult: Initial assessment;Early term 37-38.6wks;Infant < 6lbs   P2, Baby 48 hours old.  37w < 6 lbs.  Hx of breast reduction.  First child cleft palate. Mother states she did not breastfeed her first child due to cleft palate but did pump.   Mother states breastfeeding is going well. She states she is hand expressing good flow. Provided education regarding 37 week infant 5 lbs feeding behavior and suggest mother start pumping with DEBP. Mother states she did not want to start pumping at this time. LC encouraged her to hand express and spoon feed with each feed. Also offered to check latch today.  If mother desires she can call LC. Provided LPI feeding information sheet and discussed feeding frequency of at least q 3 hours.  Gave family lactation brochure with resources.       Maternal Data Has patient been taught Hand Expression?: Yes Does the patient have breastfeeding experience prior to this delivery?: No  Feeding Feeding Type: Breast Fed  LATCH Score                   Interventions Interventions: Breast feeding basics reviewed  Lactation Tools Discussed/Used WIC Program:  (Difficulty due to cleft palate with 1st child)   Consult Status Consult Status: Follow-up Date: 04/12/20 Follow-up type: In-patient    Vivianne Master [facility] 04/11/2020, 8:52 AM

## 2020-04-11 NOTE — Anesthesia Postprocedure Evaluation (Signed)
Anesthesia Post Note  Patient: Catherine Chang  Procedure(s) Performed: CESAREAN SECTION (N/A ) BILATERAL TUBAL LIGATION (Bilateral )     Patient location during evaluation: PACU Anesthesia Type: Spinal Level of consciousness: oriented and awake and alert Pain management: pain level controlled Vital Signs Assessment: post-procedure vital signs reviewed and stable Respiratory status: spontaneous breathing, respiratory function stable and patient connected to nasal cannula oxygen Cardiovascular status: blood pressure returned to baseline and stable Postop Assessment: no headache, no backache and no apparent nausea or vomiting Anesthetic complications: no   No complications documented.  Last Vitals:  Vitals:   04/11/20 0425 04/11/20 0844  BP: 115/70 108/73  Pulse: 62 (!) 56  Resp: 18 16  Temp: 36.8 C 36.7 C  SpO2: 100%     Last Pain:  Vitals:   04/11/20 0844  TempSrc: Oral  PainSc:                  [name]

## 2020-04-11 NOTE — Progress Notes (Signed)
MOB was referred for history of depression/anxiety. * Referral screened out by Clinical Social Worker because none of the following criteria appear to apply: ~ History of anxiety/depression during this pregnancy, or of post-partum depression following prior delivery. ~ Diagnosis of anxiety and/or depression within last 3 years OR * MOB's symptoms currently being treated with medication and/or therapy. Per further chart review, it is noted that MOB has active prescription for Wellbutrin for depression.    Please contact the Clinical Social Worker if needs arise, by [facility] request, or if MOB scores greater than 9/yes to question 10 on [place] Postpartum Depression Screen.   Catherine Chang Catherine Chang, MSW, LCSW [facility] [place] [facility] [place] [phone]

## 2020-04-12 MED ORDER — ACETAMINOPHEN 500 MG PO TABS
1000.0000 mg | ORAL_TABLET | Freq: Four times a day (QID) | ORAL | 0 refills | Status: DC
Start: 2020-04-12 — End: 2020-10-03

## 2020-04-12 MED ORDER — POLYETHYLENE GLYCOL 3350 17 G PO PACK
17.0000 g | PACK | Freq: Every day | ORAL | Status: DC
  Administered 2020-04-12: 17 g via ORAL
  Filled 2020-04-12: qty 1

## 2020-04-12 MED ORDER — OXYCODONE HCL 5 MG PO TABS: 5.0000 mg | ORAL_TABLET | ORAL | 0 refills | Status: DC | PRN

## 2020-04-12 MED ORDER — SIMETHICONE 80 MG PO CHEW
80.0000 mg | CHEWABLE_TABLET | ORAL | 0 refills | Status: DC | PRN
Start: 2020-04-12 — End: 2020-10-03

## 2020-04-12 MED ORDER — IBUPROFEN 800 MG PO TABS
800.0000 mg | ORAL_TABLET | Freq: Four times a day (QID) | ORAL | 0 refills | Status: DC
Start: 2020-04-12 — End: 2021-06-19

## 2020-04-12 MED FILL — IBUPROFEN 800 MG TAB: 800 | 7 days supply | Qty: 30 | Fill #0

## 2020-04-12 MED FILL — oxyCODONE HCL 5 MG TABS: 5 | 3 days supply | Qty: 30 | Fill #0

## 2020-04-12 NOTE — Discharge Summary (Addendum)
OB Discharge Summary  Patient Name: Catherine Chang DOB: 29-Jan-1991 MRN: [id number]  Date of admission: 04/10/2020 Delivering provider: Sanjuana Kava   Admitting diagnosis: Status post repeat low transverse cesarean section [Z98.891] Intrauterine pregnancy: [redacted]w[redacted]d    Secondary diagnosis: Patient Active Problem List   Diagnosis Date Noted  . Postpartum care following cesarean delivery 8/25 04/12/2020  . S/P tubal ligation 04/11/2020  . Gestational hypertension 04/11/2020  . Status post repeat low transverse cesarean section 04/10/2020  . Amniotic fluid index decreased 04/03/2020  . Oligohydramnios antepartum 04/03/2020  . History of migraine with aura 04/03/2020  . Depression 03/16/2019  . ADHD 03/16/2019   Additional problems:nobe   Date of discharge: 04/12/2020   Discharge diagnosis: Principal Problem:   Postpartum care following cesarean delivery 8/25 Active Problems:   Status post repeat low transverse cesarean section   S/P tubal ligation   Gestational hypertension                                                              Post partum procedures:none  Augmentation: N/A Pain control: Spinal  Laceration:None  Episiotomy:None  Complications: None  Hospital course:  Sceduled C/S   29y.o. yo G2P2002 at 355w0das admitted to the hospital 04/10/2020 for scheduled cesarean section with the following indication:Elective Repeat and IUGR, oligohydramnious.Delivery details are as follows:  Membrane Rupture Time/Date: 2:16 PM ,04/10/2020   Delivery Method:C-Section, Low Transverse  Details of operation can be found in separate operative note.  Patient had an uncomplicated postpartum course.  She is ambulating, tolerating a regular diet, passing flatus, and urinating well. Patient is discharged home in stable condition on  04/12/20        Newborn Data: Birth date:04/10/2020  Birth time:2:16 PM  Gender:Female  Living status:Living  Apgars:9 ,9  Weight:2630 g     Physical  exam  Vitals:   04/11/20 0844 04/11/20 1302 04/11/20 2026 04/12/20 0618  BP: 108/73 110/65 103/69 106/78  Pulse: (!) 56 (!) 58 60 65  Resp: 16 16 16 16   Temp: 98.1 F (36.7 C) 98.4 F (36.9 C) 97.7 F (36.5 C) 98 F (36.7 C)  TempSrc: Oral Oral Axillary Oral  SpO2:  99%    Weight:      Height:       General: alert, cooperative and no distress Lochia: appropriate Uterine Fundus: firm Incision: Dressing is clean, dry, and intact DVT Evaluation: No cords or calf tenderness. No significant calf/ankle edema. Labs: Lab Results  Component Value Date   WBC 23.5 (H) 04/11/2020   HGB 11.3 (L) 04/11/2020   HCT 33.8 (L) 04/11/2020   MCV 93.4 04/11/2020   PLT 251 04/11/2020   CMP Latest Ref Rng & Units 04/10/2020  Glucose 70 - 99 mg/dL 73  BUN 6 - 20 mg/dL 13  Creatinine 0.44 - 1.00 mg/dL 0.69  Sodium 135 - 145 mmol/L 133(L)  Potassium 3.5 - 5.1 mmol/L 5.8(H)  Chloride 98 - 111 mmol/L 104  CO2 22 - 32 mmol/L 14(L)  Calcium 8.9 - 10.3 mg/dL 9.2  Total Protein 6.5 - 8.1 g/dL 6.1(L)  Total Bilirubin 0.3 - 1.2 mg/dL 1.9(H)  Alkaline Phos 38 - 126 U/L 308(H)  AST 15 - 41 U/L 68(H)  ALT 0 - 44 U/L 15   Edinburgh  Postnatal Depression Scale Screening Tool 04/11/2020 04/11/2020  I have been able to laugh and see the funny side of things. 0 (No Data)  I have looked forward with enjoyment to things. 0 -  I have blamed myself unnecessarily when things went wrong. 1 -  I have been anxious or worried for no good reason. 2 -  I have felt scared or panicky for no good reason. 1 -  Things have been getting on top of me. 1 -  I have been so unhappy that I have had difficulty sleeping. 2 -  I have felt sad or miserable. 0 -  I have been so unhappy that I have been crying. 0 -  The thought of harming myself has occurred to me. 0 -  Edinburgh Postnatal Depression Scale Total 7 -  Some encounter information is confidential and restricted. Go to Review Flowsheets activity to see all data.     Discharge instruction:  per After Visit Summary,  "Understanding Mother & Baby Care" hospital booklet  After Visit Meds:  Allergies as of 04/12/2020   No Known Allergies     Medication List    STOP taking these medications   aspirin-acetaminophen-caffeine 409-811-91 MG tablet Commonly known as: EXCEDRIN MIGRAINE     TAKE these medications   acetaminophen 500 MG tablet Commonly known as: TYLENOL Take 2 tablets (1,000 mg total) by mouth every 6 (six) hours.   amphetamine-dextroamphetamine 10 MG tablet Commonly known as: ADDERALL Take 1 tablet (10 mg total) by mouth 2 (two) times daily. What changed:   when to take this  reasons to take this   buPROPion 300 MG 24 hr tablet Commonly known as: Wellbutrin XL Take 1 tablet (300 mg total) by mouth daily.   docusate sodium 100 MG capsule Commonly known as: COLACE Take 100 mg by mouth daily.   fluticasone 50 MCG/ACT nasal spray Commonly known as: FLONASE Place 1 spray into both nostrils daily as needed for allergies or rhinitis.   ibuprofen 800 MG tablet Commonly known as: ADVIL Take 1 tablet (800 mg total) by mouth every 6 (six) hours.   oxyCODONE 5 MG immediate release tablet Commonly known as: Oxy IR/ROXICODONE Take 1-2 tablets (5-10 mg total) by mouth every 4 (four) hours as needed for moderate pain.   prenatal multivitamin Tabs tablet Take 1 tablet by mouth daily.   simethicone 80 MG chewable tablet Commonly known as: MYLICON Chew 1 tablet (80 mg total) by mouth as needed for flatulence.   traZODone 100 MG tablet Commonly known as: DESYREL Take 1 tablet (100 mg total) by mouth at bedtime. What changed:   how much to take  when to take this  reasons to take this       Diet: routine diet  Activity: Advance as tolerated. Pelvic rest for 6 weeks.   Postpartum contraception: Tubal Ligation  Newborn Data: Live born female  Birth Weight: 5 lb 12.8 oz (2630 g) APGAR: 68, 9  Newborn Delivery    Birth date/time: 04/10/2020 14:16:00 Delivery type: C-Section, Low Transverse Trial of labor: No C-section categorization: Repeat      named Molly Baby Feeding: Breast Disposition:home with mother   Delivery Report:  Review the Delivery Report for details.    Follow up:  Follow-up [facility]. Go in 1 week(s).   Specialty: Obstetrics and Gynecology Why: incision and blood pressure check. Contact information: [place]. [address] [place] [address] [phone]  Signed: Juliene Pina, [name], MSN 04/12/2020, 9:14 AM

## 2020-04-22 ENCOUNTER — Other Ambulatory Visit (HOSPITAL_COMMUNITY): Admission: RE | Admit: 2020-04-22 | Payer: 59 | Source: Ambulatory Visit

## 2020-04-24 ENCOUNTER — Encounter (HOSPITAL_COMMUNITY): Admission: RE | Payer: Self-pay | Source: Home / Self Care

## 2020-04-24 ENCOUNTER — Inpatient Hospital Stay (HOSPITAL_COMMUNITY): Admission: RE | Admit: 2020-04-24 | Payer: 59 | Source: Home / Self Care | Admitting: Obstetrics & Gynecology

## 2020-04-24 SURGERY — Surgical Case
Anesthesia: Regional

## 2020-06-13 ENCOUNTER — Other Ambulatory Visit: Payer: Self-pay | Admitting: Internal Medicine

## 2020-06-13 ENCOUNTER — Telehealth: Payer: Self-pay | Admitting: Internal Medicine

## 2020-06-13 MED ORDER — AMPHETAMINE-DEXTROAMPHETAMINE 10 MG PO TABS
10.0000 mg | ORAL_TABLET | Freq: Two times a day (BID) | ORAL | 0 refills | Status: DC
Start: 2020-06-13 — End: 2020-07-04

## 2020-06-13 MED ORDER — BUPROPION HCL ER (XL) 300 MG PO TB24: 300.0000 mg | ORAL_TABLET | Freq: Every day | ORAL | 3 refills | Status: DC

## 2020-06-13 MED FILL — buPROPion HCL ER (XL) 300 M: 300 | 90 days supply | Qty: 90 | Fill #0

## 2020-06-13 MED FILL — AMPHETAMINE SALTS 10 MG: 10 | 30 days supply | Qty: 60 | Fill #0

## 2020-06-13 NOTE — Telephone Encounter (Signed)
Received call from [place].  She has finished breastfeeding, about to return to work after maternity leave.  Would like to restart  Adderall.  Also needs refill on wellbutrin.

## 2020-06-29 ENCOUNTER — Other Ambulatory Visit: Payer: Self-pay

## 2020-06-29 ENCOUNTER — Ambulatory Visit: Payer: 59 | Attending: Internal Medicine

## 2020-06-29 DIAGNOSIS — Z23 Encounter for immunization: Secondary | ICD-10-CM

## 2020-06-29 NOTE — Progress Notes (Signed)
   Covid-19 Vaccination Clinic  Name:  Catherine Chang    MRN: [id number] DOB: Aug 12, 1991  06/29/2020  Catherine Chang was observed post Covid-19 immunization for 15 minutes without incident. She was provided with Vaccine Information Sheet and instruction to access the V-Safe system.   Catherine Chang was instructed to call 911 with any severe reactions post vaccine: Marland Kitchen Difficulty breathing  . Swelling of face and throat  . A fast heartbeat  . A bad rash all over body  . Dizziness and weakness   Immunizations Administered    Name Date Dose VIS Date Route   Pfizer COVID-19 Vaccine 06/29/2020 12:42 PM 0.3 mL 06/05/2020 Intramuscular   Manufacturer: [employer]   Lot: [id number]   [place]: [id number]

## 2020-07-04 ENCOUNTER — Other Ambulatory Visit (HOSPITAL_COMMUNITY): Payer: Self-pay | Admitting: Internal Medicine

## 2020-07-04 ENCOUNTER — Telehealth: Payer: Self-pay | Admitting: Internal Medicine

## 2020-07-04 MED ORDER — AMPHETAMINE-DEXTROAMPHETAMINE 10 MG PO TABS
10.0000 mg | ORAL_TABLET | Freq: Two times a day (BID) | ORAL | 0 refills | Status: DC
Start: 2020-07-13 — End: 2020-09-09

## 2020-07-04 MED ORDER — AMPHETAMINE-DEXTROAMPHETAMINE 10 MG PO TABS
10.0000 mg | ORAL_TABLET | Freq: Two times a day (BID) | ORAL | 0 refills | Status: DC
Start: 2020-08-12 — End: 2020-09-09

## 2020-07-04 NOTE — Telephone Encounter (Signed)
Spoke with Kingslee, doing well adjusting back to work schedule, adderall at 34m twice daily is sufficiently controlling ADHD symptoms.  Refill Adderall sent.

## 2020-07-13 MED FILL — AMPHETAMINE SALTS 10 MG: 10 | 30 days supply | Qty: 60 | Fill #0

## 2020-08-04 DIAGNOSIS — Z20822 Contact with and (suspected) exposure to covid-19: Secondary | ICD-10-CM | POA: Diagnosis not present

## 2020-08-05 MED FILL — ZOLPIDEM TARTRATE 5 MG TABS: 5 | 30 days supply | Qty: 30 | Fill #1

## 2020-08-15 MED FILL — AMPHETAMINE SALTS 10 MG: 10 | 30 days supply | Qty: 60 | Fill #0

## 2020-09-09 ENCOUNTER — Telehealth: Payer: Self-pay | Admitting: Internal Medicine

## 2020-09-09 ENCOUNTER — Other Ambulatory Visit: Payer: Self-pay | Admitting: Internal Medicine

## 2020-09-09 MED ORDER — AMOXICILLIN-POT CLAVULANATE 875-125 MG PO TABS: 1.0000 | ORAL_TABLET | Freq: Two times a day (BID) | ORAL | 0 refills | Status: DC

## 2020-09-09 MED ORDER — AMPHETAMINE-DEXTROAMPHETAMINE 10 MG PO TABS: 10.0000 mg | ORAL_TABLET | Freq: Two times a day (BID) | ORAL | 0 refills | Status: DC

## 2020-09-09 MED ORDER — FLUCONAZOLE 150 MG PO TABS: 150.0000 mg | ORAL_TABLET | ORAL | 0 refills | Status: DC

## 2020-09-09 MED FILL — AMOX-CLAV 875-125 MG TABLET: 875-125 | 7 days supply | Qty: 14 | Fill #0

## 2020-09-09 MED FILL — FLUCONAZOLE 150 MG TABS: 150 | 6 days supply | Qty: 2 | Fill #0

## 2020-09-09 NOTE — Telephone Encounter (Signed)
Catherine Chang has had sinus pressure and pain for the last 4 weeks.  Negative for Covid.  Has been using Nettie pot Flonase and Sudafed without relief.  No fevers.  Given duration of symptoms will send Augmentin prescription.  She also frequently has vaginal candidiasis after Augmentin so I sent a Diflucan prescription if needed.  She is due for her Adderall refill things are going well at work.  We will see her for follow-up in the next 1 to 2 months.

## 2020-09-12 MED FILL — AMPHETAMINE SALTS 10 MG: 10 | 30 days supply | Qty: 60 | Fill #0

## 2020-10-03 ENCOUNTER — Other Ambulatory Visit: Payer: Self-pay

## 2020-10-03 ENCOUNTER — Ambulatory Visit (HOSPITAL_COMMUNITY)
Admission: RE | Admit: 2020-10-03 | Discharge: 2020-10-03 | Disposition: A | Discharge status: Home / Self Care | Payer: 59 | Source: Ambulatory Visit | Attending: Internal Medicine | Admitting: Internal Medicine

## 2020-10-03 ENCOUNTER — Encounter: Payer: Self-pay | Admitting: Internal Medicine

## 2020-10-03 ENCOUNTER — Ambulatory Visit (INDEPENDENT_AMBULATORY_CARE_PROVIDER_SITE_OTHER): Payer: 59 | Admitting: Internal Medicine

## 2020-10-03 VITALS — BP 128/84 | HR 77 | Temp 97.8°F | Ht 62.0 in | Wt 159.0 lb

## 2020-10-03 DIAGNOSIS — E039 Hypothyroidism, unspecified: Secondary | ICD-10-CM

## 2020-10-03 DIAGNOSIS — F32A Depression, unspecified: Secondary | ICD-10-CM | POA: Diagnosis not present

## 2020-10-03 DIAGNOSIS — M21612 Bunion of left foot: Secondary | ICD-10-CM

## 2020-10-03 DIAGNOSIS — G8929 Other chronic pain: Secondary | ICD-10-CM | POA: Diagnosis not present

## 2020-10-03 DIAGNOSIS — M7732 Calcaneal spur, left foot: Secondary | ICD-10-CM | POA: Diagnosis not present

## 2020-10-03 DIAGNOSIS — M2012 Hallux valgus (acquired), left foot: Secondary | ICD-10-CM | POA: Diagnosis not present

## 2020-10-03 DIAGNOSIS — F909 Attention-deficit hyperactivity disorder, unspecified type: Secondary | ICD-10-CM

## 2020-10-03 DIAGNOSIS — M79672 Pain in left foot: Secondary | ICD-10-CM

## 2020-10-03 DIAGNOSIS — R5383 Other fatigue: Secondary | ICD-10-CM | POA: Diagnosis not present

## 2020-10-03 DIAGNOSIS — R7989 Other specified abnormal findings of blood chemistry: Secondary | ICD-10-CM

## 2020-10-03 DIAGNOSIS — K5900 Constipation, unspecified: Secondary | ICD-10-CM

## 2020-10-03 IMAGING — DX DG FOOT COMPLETE 3+V*L*
3 series · 3 of 3 positions shown · non-contrast
Comparison: None

CLINICAL DATA: Lateral midfoot pain for 6 weeks, some chronic mild
pain in area for 3 years, pain at bases of fourth and fifth
metatarsals

EXAM:
LEFT FOOT - COMPLETE 3+ VIEW

[foot ap]
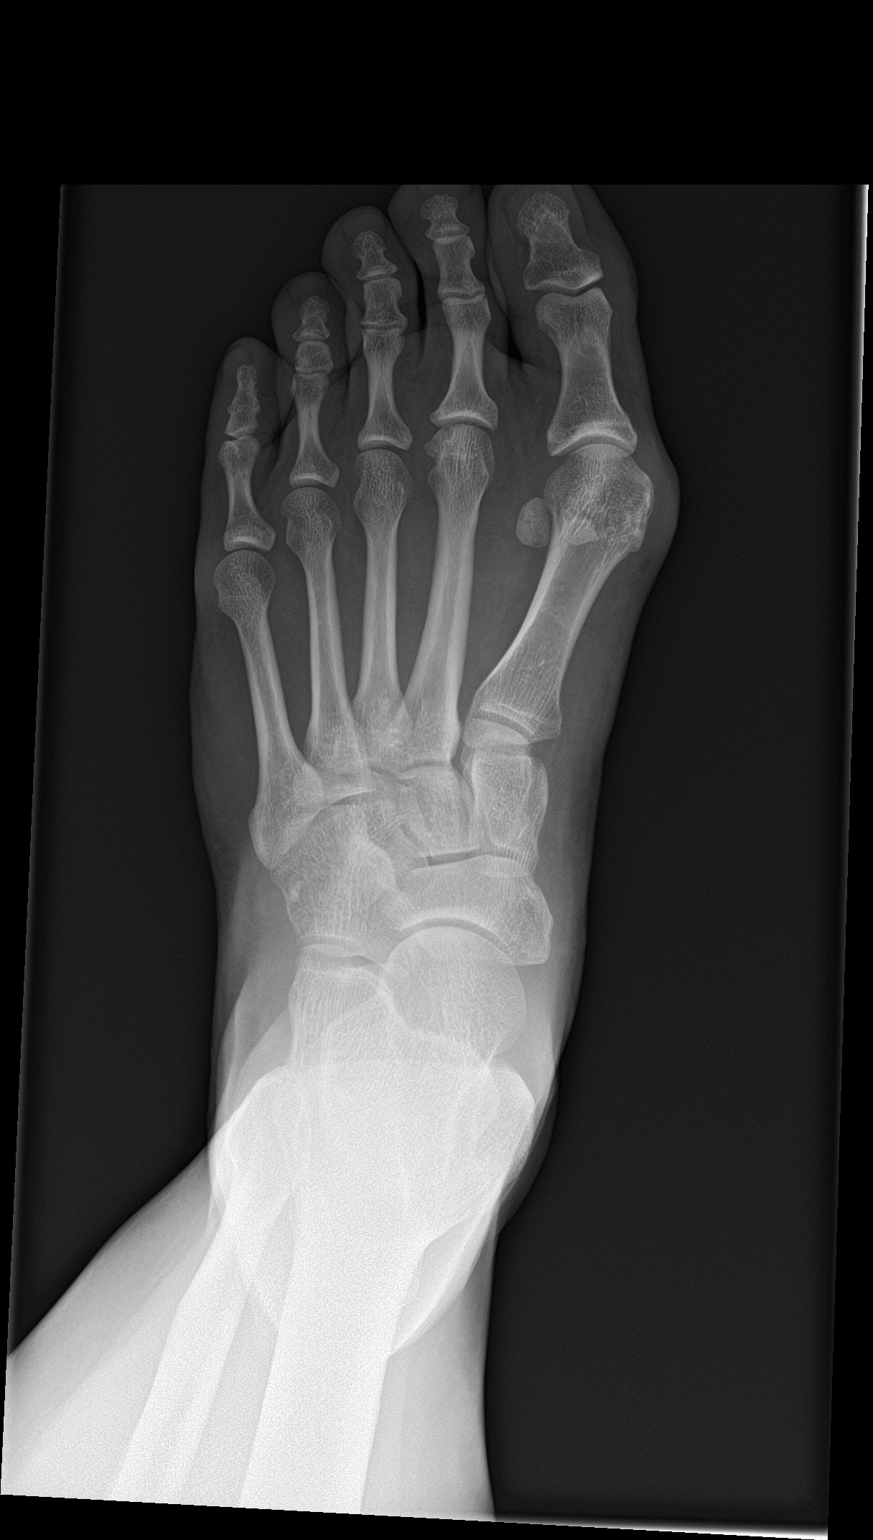

[foot obl]
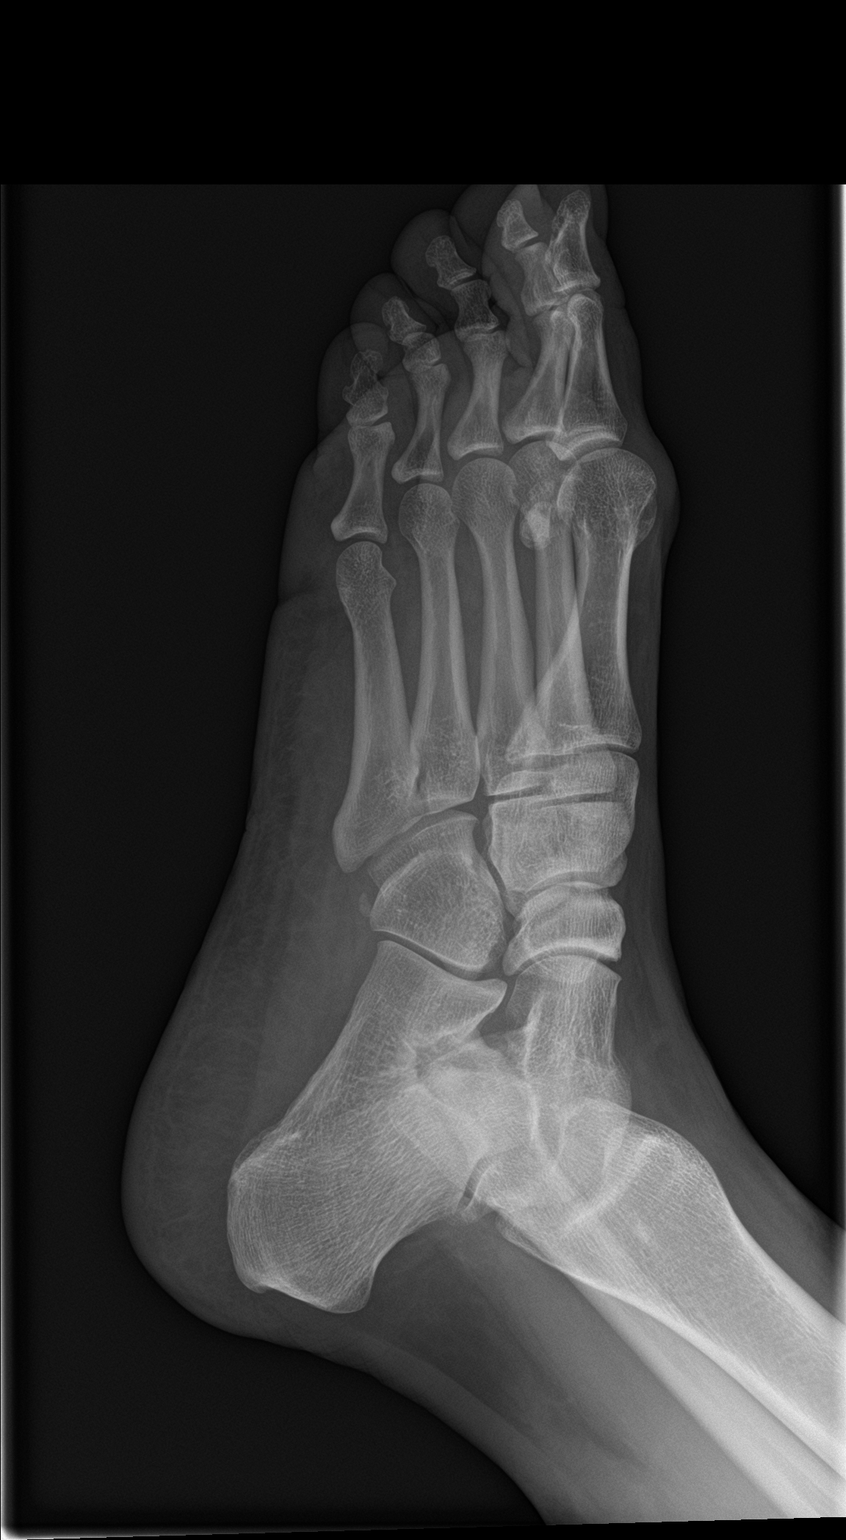

[foot lat]
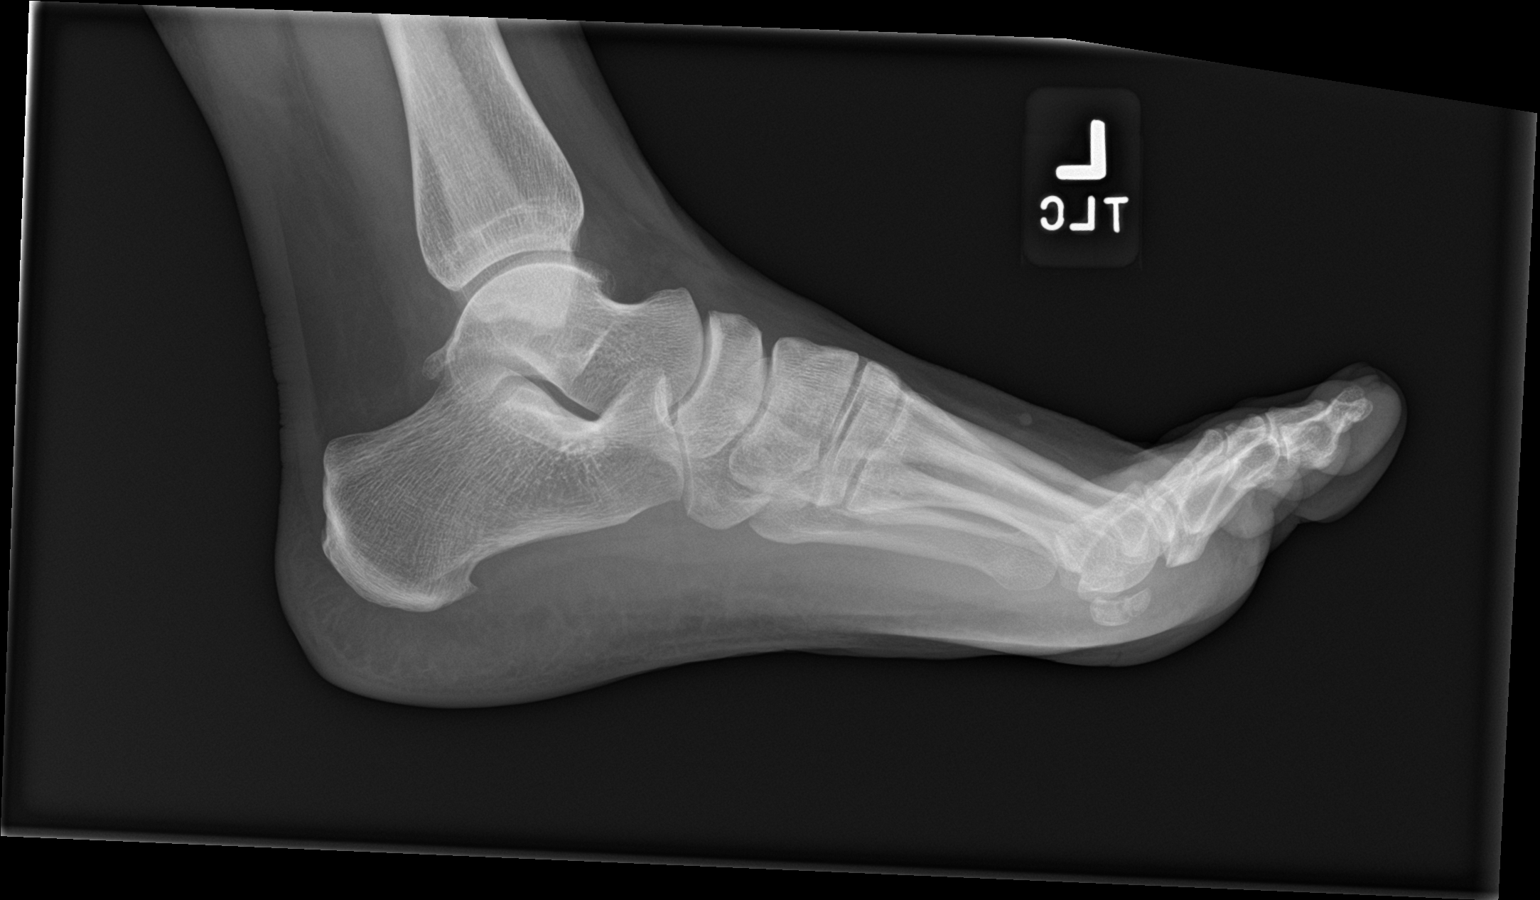

[3 of 3 positions shown; findings below may reference images not displayed]

FINDINGS: Mild hallux valgus with bunion deformity at first metatarsal head.

Osseous mineralization normal.

Joint spaces otherwise preserved.

No acute fracture, dislocation, or bone destruction.

Tiny plantar calcaneal spur.
IMPRESSION: No acute osseous abnormalities.

Tiny plantar calcaneal spur.

Hallux valgus with bunion deformity first metatarsal head.

## 2020-10-03 NOTE — Assessment & Plan Note (Signed)
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  She had plan to eventually have the left foot bunion corrected but never got around to it.  Over the past 3 years she has been having some tenderness on the lateral aspect of her left foot she does not know if it is somehow related to the bunion or not.  Over the past 3 weeks that left foot tenderness has been worse no direct trauma to the area.  Assessment left foot bunion, left foot pain at cuboid area  Plan Obtain x-ray of left foot Refer to podiatry for evaluation.

## 2020-10-03 NOTE — Progress Notes (Signed)
  Subjective:  HPI: [name] is a 30 y.o. female who presents for fatigue/constipation, and left foot pain  Of note she reports that her sinus infection cleared up within 2 days of treatment with Augmentin.   Please see Assessment and Plan below for the status of her chronic medical problems.  Objective:  Physical Exam: Vitals:   10/03/20 1118  BP: 128/84  Pulse: 77  Temp: 97.8 F (36.6 C)  TempSrc: Oral  SpO2: 100%  Weight: 159 lb (72.1 kg)   Body mass index is 29.08 kg/m. Physical Exam Constitutional:      Appearance: Normal appearance.  Pulmonary:     Effort: Pulmonary effort is normal.  Musculoskeletal:     Right foot: No swelling or bunion (surgical scar).     Left foot: Bunion and tenderness (tenderness over cuboid) present. No swelling.       Legs:  Neurological:     Mental Status: She is alert.  Psychiatric:        Mood and Affect: Mood normal.    Assessment & Plan:  See Encounters Tab for problem based charting.  Medications Ordered No orders of the defined types were placed in this encounter.  Other Orders Orders Placed This Encounter  Procedures  . CBC with Diff  . CMP14 + Anion Gap  . TSH   Follow Up: No follow-ups on file.

## 2020-10-03 NOTE — Assessment & Plan Note (Signed)
HPI: Continues on Adderall for ADHD.  Denies any issues with the medication.  Continues to be effective helping her focus on work tasks that need to be done.  Assessment ADHD well controlled with Adderall  Plan Continue Adderall 10 mg twice daily as needed

## 2020-10-03 NOTE — Assessment & Plan Note (Signed)
HPI: She has been experiencing increased fatigue.  Thinks this may be due to the stressors of having a second child and full-time job.  She has noticed a few pounds of weight gain.  Also been having increased constipation which she suspects may be related to her diet.  During pregnancy she did more cooking but lately has ordered out with increased frequency. She has a strong family history of hypothyroidism especially on her maternal side with many of them developing hypothyroidism in their 50s.  Assessment fatigue, constipation, weight gain  Plan We will check routine labs with CBC CMP and TSH.

## 2020-10-04 LAB — CBC WITH DIFFERENTIAL/PLATELET
Basophils Absolute: 0.1 10*3/uL (ref 0.0–0.2)
Basos: 1 %
EOS (ABSOLUTE): 0 10*3/uL (ref 0.0–0.4)
Eos: 1 %
Hematocrit: 40.8 % (ref 34.0–46.6)
Hemoglobin: 13.5 g/dL (ref 11.1–15.9)
Immature Grans (Abs): 0 10*3/uL (ref 0.0–0.1)
Immature Granulocytes: 1 %
Lymphocytes Absolute: 3.1 10*3/uL (ref 0.7–3.1)
Lymphs: 35 %
MCH: 29.9 pg (ref 26.6–33.0)
MCHC: 33.1 g/dL (ref 31.5–35.7)
MCV: 90 fL (ref 79–97)
Monocytes Absolute: 0.6 10*3/uL (ref 0.1–0.9)
Monocytes: 7 %
Neutrophils Absolute: 4.9 10*3/uL (ref 1.4–7.0)
Neutrophils: 55 %
Platelets: 374 10*3/uL (ref 150–450)
RBC: 4.52 x10E6/uL (ref 3.77–5.28)
RDW: 13.1 % (ref 11.7–15.4)
WBC: 8.7 10*3/uL (ref 3.4–10.8)

## 2020-10-04 LAB — CMP14 + ANION GAP
ALT: 17 IU/L (ref 0–32)
AST: 16 IU/L (ref 0–40)
Albumin/Globulin Ratio: 2.1 (ref 1.2–2.2)
Albumin: 4.7 g/dL (ref 3.8–4.8)
Alkaline Phosphatase: 101 IU/L (ref 44–121)
Anion Gap: 18 mmol/L (ref 10.0–18.0)
BUN/Creatinine Ratio: 14 (ref 9–23)
BUN: 11 mg/dL (ref 6–20)
Bilirubin Total: <0.2 mg/dL (ref 0.0–1.2)
CO2: 18 mmol/L — ABNORMAL LOW (ref 20–29)
Calcium: 10 mg/dL (ref 8.7–10.2)
Chloride: 100 mmol/L (ref 96–106)
Creatinine, Ser: 0.77 mg/dL (ref 0.57–1.00)
GFR calc Af Amer: 119 mL/min/{1.73_m2} (ref >59)
GFR calc non Af Amer: 103 mL/min/{1.73_m2} (ref >59)
Globulin, Total: 2.2 g/dL (ref 1.5–4.5)
Glucose: 88 mg/dL (ref 65–99)
Potassium: 4.5 mmol/L (ref 3.5–5.2)
Sodium: 136 mmol/L (ref 134–144)
Total Protein: 6.9 g/dL (ref 6.0–8.5)

## 2020-10-04 LAB — TSH: TSH: 47.4 u[IU]/mL — ABNORMAL HIGH (ref 0.450–4.500)

## 2020-10-04 MED ORDER — LEVOTHYROXINE SODIUM 112 MCG PO TABS: 112.0000 ug | ORAL_TABLET | Freq: Every day | ORAL | 1 refills | Status: DC

## 2020-10-04 MED FILL — LEVOTHYROXINE 112 MCG TAB: 112 | 30 days supply | Qty: 30 | Fill #0

## 2020-10-04 MED FILL — buPROPion HCL ER (XL) 300 M: 300 | 90 days supply | Qty: 90 | Fill #1

## 2020-10-04 NOTE — Addendum Note (Signed)
Addended by: Joni Reining C on: 10/04/2020 12:05 PM   Modules accepted: Orders

## 2020-10-04 NOTE — Addendum Note (Signed)
Addended by: Lucious Groves on: 10/04/2020 01:59 PM   Modules accepted: Orders

## 2020-10-07 DIAGNOSIS — O905 Postpartum thyroiditis: Secondary | ICD-10-CM

## 2020-10-07 DIAGNOSIS — E039 Hypothyroidism, unspecified: Secondary | ICD-10-CM | POA: Insufficient documentation

## 2020-10-07 HISTORY — DX: Postpartum thyroiditis: O90.5

## 2020-10-07 LAB — THYROID PEROXIDASE ANTIBODY: Thyroperoxidase Ab SerPl-aCnc: 161 IU/mL — ABNORMAL HIGH (ref 0–34)

## 2020-10-07 LAB — SPECIMEN STATUS REPORT

## 2020-10-07 LAB — T4, FREE: Free T4: 0.81 ng/dL — ABNORMAL LOW (ref 0.82–1.77)

## 2020-10-07 LAB — T3: T3, Total: 105 ng/dL (ref 71–180)

## 2020-10-07 NOTE — Assessment & Plan Note (Signed)
Labs returned with markedly elevated TSH of 47.  I added on free T4 and total T3 this revealed mildly low free T4 normal limits T3.  Given her elevation of TSH above 10 and symptoms of constipation fatigue I did go ahead and start her on levothyroxine.  Her TPO antibodies are positive with a strong family history I do suspect she has a degree of thyroiditis part of the question here is if she has a component of postpartum hypothyroidism that may be improving.  We discussed trial of levothyroxine 112 mcg initially prescribed daily however we reduce this to half a tab daily given her near normal free T4.  We will plan to reassess symptoms and repeat testing.  We may try to reduce levothyroxine with close monitoring.

## 2020-10-08 ENCOUNTER — Ambulatory Visit (INDEPENDENT_AMBULATORY_CARE_PROVIDER_SITE_OTHER): Payer: 59

## 2020-10-08 ENCOUNTER — Encounter: Payer: Self-pay | Admitting: Podiatry

## 2020-10-08 ENCOUNTER — Other Ambulatory Visit: Payer: Self-pay

## 2020-10-08 ENCOUNTER — Ambulatory Visit (INDEPENDENT_AMBULATORY_CARE_PROVIDER_SITE_OTHER): Payer: 59 | Admitting: Podiatry

## 2020-10-08 DIAGNOSIS — M2012 Hallux valgus (acquired), left foot: Secondary | ICD-10-CM

## 2020-10-08 DIAGNOSIS — M778 Other enthesopathies, not elsewhere classified: Secondary | ICD-10-CM

## 2020-10-08 DIAGNOSIS — M7752 Other enthesopathy of left foot: Secondary | ICD-10-CM

## 2020-10-08 MED ORDER — DEXAMETHASONE SODIUM PHOSPHATE 120 MG/30ML IJ SOLN: 2.0000 mg | Freq: Once | INTRAMUSCULAR | Status: DC

## 2020-10-08 NOTE — Progress Notes (Signed)
Subjective:  Patient ID: Catherine Chang, female    DOB: [birth year],  MRN: [id number] HPI Chief Complaint  Patient presents with  . 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 New Patient (Initial Visit)    30 y.o. female presents with the above complaint.   ROS: Denies fever chills nausea vomiting muscle aches pains calf pain back pain chest pain shortness of breath.  Past Medical History:  Diagnosis Date  . ADHD   . Depression    Past Surgical History:  Procedure Laterality Date  . BREAST REDUCTION SURGERY Bilateral 2006  . BUNIONECTOMY Right 2005   [place] N/A 03/23/2018   Procedure: PRIMARY CESAREAN SECTION;  Surgeon: Everett Graff, MD;  Location: [facility];  Service: Obstetrics;  Laterality: N/A;  RNFA Requested  . CESAREAN SECTION N/A 04/10/2020   Procedure: CESAREAN SECTION;  Surgeon: Sanjuana Kava, MD;  Location: [facility] LD [facility]S;  Service: Obstetrics;  Laterality: N/A;  . TUBAL LIGATION Bilateral 04/10/2020   Procedure: BILATERAL TUBAL LIGATION;  Surgeon: Sanjuana Kava, MD;  Location: [facility];  Service: Obstetrics;  Laterality: Bilateral;    Current Outpatient Medications:  .  amphetamine-dextroamphetamine (ADDERALL) 10 MG tablet, Take 1 tablet (10 mg total) by mouth 2 (two) times daily., Disp: 60 tablet, Rfl: 0 .  buPROPion (WELLBUTRIN XL) 300 MG 24 hr tablet, Take 1 tablet (300 mg total) by mouth daily., Disp: 90 tablet, Rfl: 3 .  fluticasone (FLONASE) 50 MCG/ACT nasal spray, Place 1 spray into both nostrils daily as needed for allergies or rhinitis., Disp: , Rfl:  .  ibuprofen (ADVIL) 800 MG tablet, Take 1 tablet (800 mg total) by mouth every 6 (six) hours., Disp: 30 tablet, Rfl: 0 .  levothyroxine (SYNTHROID) 112 MCG tablet, Take 1 tablet (112 mcg total) by mouth daily before breakfast., Disp: 30 tablet, Rfl: 1 .  traZODone  (DESYREL) 100 MG tablet, Take 1 tablet (100 mg total) by mouth at bedtime. (Patient taking differently: Take 50 mg by mouth at bedtime as needed for sleep. ), Disp: 90 tablet, Rfl: 1 .  zolpidem (AMBIEN) 5 MG tablet, Take 5 mg by mouth at bedtime., Disp: , Rfl:   Current Facility-Administered Medications:  .  dexamethasone (DECADRON) injection 2 mg, 2 mg, Intra-articular, Once, Edwyn Inclan T, DPM  No Known Allergies Review of Systems Objective:  There were no vitals filed for this visit.  General: Well developed, nourished, in no acute distress, alert and oriented x3   Dermatological: Skin is warm, dry and supple bilateral. Nails x 10 are well maintained; remaining integument appears unremarkable at this time. There are no open sores, no preulcerative lesions, no rash or signs of infection present.  Vascular: Dorsalis Pedis artery and Posterior Tibial artery pedal pulses are 2/4 bilateral with immedate capillary fill time. Pedal hair growth present. No varicosities and no lower extremity edema present bilateral.   Neruologic: Grossly intact via light touch bilateral. Vibratory intact via tuning fork bilateral. Protective threshold with Semmes Wienstein monofilament intact to all pedal sites bilateral. Patellar and Achilles deep tendon reflexes 2+ bilateral. No Babinski or clonus noted bilateral.   Musculoskeletal: No gross boney pedal deformities bilateral. No pain, crepitus, or limitation noted with foot and ankle range of motion bilateral. Muscular strength 5/5 in all groups tested bilateral.  She has pain on palpation fourth fifth tarsometatarsal joint  on the cuboid side.  There is a small area of spurring dorsally beneath the extensor digitorum brevis with an area of fluctuance on palpation.  Gait: Unassisted, Nonantalgic.    Radiographs:  Evaluated radiographs taken by the hospital.  These were not weightbearing and there is no significant finding other than bunion  deformity.  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  Plan: Discussed etiology pathology conservative surgical therapies at this point time we injected 2 mg of dexamethasone at the point of maximal tenderness.  We discussed appropriate shoe gear stretching exercise ice therapy sugar modifications I will follow-up with her in 2 to 3 weeks at which time if she is not improved we will consider an MRI of that area.     Kamaal Cast T. [place], [place]

## 2020-10-09 ENCOUNTER — Other Ambulatory Visit: Payer: Self-pay | Admitting: Internal Medicine

## 2020-10-09 MED ORDER — AMPHETAMINE-DEXTROAMPHETAMINE 10 MG PO TABS
10.0000 mg | ORAL_TABLET | Freq: Two times a day (BID) | ORAL | 0 refills | Status: DC
Start: 2020-10-09 — End: 2020-11-12

## 2020-10-09 NOTE — Addendum Note (Signed)
Addended by: Joni Reining C on: 10/09/2020 11:16 AM   Modules accepted: Orders

## 2020-10-10 MED FILL — AMPHETAMINE SALTS 10 MG: 10 | 30 days supply | Qty: 60 | Fill #0

## 2020-10-14 ENCOUNTER — Other Ambulatory Visit: Payer: Self-pay | Admitting: Podiatry

## 2020-10-14 DIAGNOSIS — M7752 Other enthesopathy of left foot: Secondary | ICD-10-CM

## 2020-10-22 ENCOUNTER — Ambulatory Visit: Payer: 59 | Admitting: Podiatry

## 2020-10-22 ENCOUNTER — Other Ambulatory Visit: Payer: Self-pay

## 2020-10-22 ENCOUNTER — Encounter: Payer: Self-pay | Admitting: Podiatry

## 2020-10-22 DIAGNOSIS — M7752 Other enthesopathy of left foot: Secondary | ICD-10-CM | POA: Diagnosis not present

## 2020-10-22 NOTE — Progress Notes (Signed)
She presents today for follow-up of her capsulitis fourth fifth tarsometatarsal joint left foot.  Feels about 75% better.  Objective: Vital signs are stable alert and oriented x3.  Pulses are palpable.  She has much decrease in reproducible pain today on palpation to that joint.  There is no erythema cellulitis drainage or odor associated with it.  Assessment: 75% resolved capsulitis fourth fifth tarsometatarsal joint.  Plan: Offered her injection today she declined did discuss the possible need for orthotics in the future she understands this is amenable to a follow-up with me as needed.

## 2020-10-30 ENCOUNTER — Other Ambulatory Visit: Payer: Self-pay | Admitting: Internal Medicine

## 2020-10-30 DIAGNOSIS — E039 Hypothyroidism, unspecified: Secondary | ICD-10-CM

## 2020-10-30 MED ORDER — TRAZODONE HCL 50 MG PO TABS: 50.0000 mg | ORAL_TABLET | Freq: Every evening | ORAL | 1 refills | Status: DC | PRN

## 2020-10-30 NOTE — Progress Notes (Signed)
Refilled Trazadone and ordered repeat TSH

## 2020-10-31 ENCOUNTER — Other Ambulatory Visit: Payer: 59

## 2020-10-31 DIAGNOSIS — E039 Hypothyroidism, unspecified: Secondary | ICD-10-CM

## 2020-11-01 LAB — TSH: TSH: 4.39 u[IU]/mL (ref 0.450–4.500)

## 2020-11-04 ENCOUNTER — Other Ambulatory Visit: Payer: Self-pay | Admitting: Internal Medicine

## 2020-11-04 MED ORDER — LEVOTHYROXINE SODIUM 75 MCG PO TABS: 75.0000 ug | ORAL_TABLET | Freq: Every day | ORAL | 1 refills | Status: DC

## 2020-11-04 MED FILL — LEVOTHYROXINE 75 MCG TABLET: 75 | 30 days supply | Qty: 30 | Fill #0

## 2020-11-04 NOTE — Progress Notes (Signed)
Repeat TSH after 1 month of 1/2 tablet 159mg levothyroxine now 4.39, fatigue has improved, reports hair thicker and feeling better.  Will increase to 721m of synthroid daily and follow up in another month.

## 2020-11-12 ENCOUNTER — Other Ambulatory Visit: Payer: Self-pay | Admitting: Internal Medicine

## 2020-11-12 ENCOUNTER — Telehealth: Payer: Self-pay | Admitting: Internal Medicine

## 2020-11-12 MED ORDER — AMPHETAMINE-DEXTROAMPHETAMINE 10 MG PO TABS
10.0000 mg | ORAL_TABLET | Freq: Two times a day (BID) | ORAL | 0 refills | Status: DC
Start: 2020-11-12 — End: 2020-11-12

## 2020-11-12 MED FILL — AMPHETAMINE-DEXTROAMPHETAMI: 10 | 30 days supply | Qty: 60 | Fill #0

## 2020-11-12 NOTE — Telephone Encounter (Signed)
Catherine Chang reports she is overall doing well, not taking adderall everyday only as needed.  Typically needs more often when attending on inpatient services.  No AE.  Will refill 1 month supply.

## 2020-12-02 MED FILL — Levothyroxine Sodium Tab 75 MCG: ORAL | 30 days supply | Qty: 30 | Fill #0 | Status: AC

## 2020-12-03 ENCOUNTER — Other Ambulatory Visit (HOSPITAL_COMMUNITY): Payer: Self-pay

## 2020-12-11 ENCOUNTER — Other Ambulatory Visit: Payer: Self-pay | Admitting: Internal Medicine

## 2020-12-11 ENCOUNTER — Other Ambulatory Visit (HOSPITAL_COMMUNITY): Payer: Self-pay

## 2020-12-11 MED ORDER — AMPHETAMINE-DEXTROAMPHETAMINE 10 MG PO TABS
ORAL_TABLET | Freq: Two times a day (BID) | ORAL | 0 refills | Status: DC
  Filled 2020-12-11: qty 60, 30d supply, fill #0

## 2020-12-11 NOTE — Progress Notes (Signed)
Refilled adderall Rx.

## 2020-12-12 ENCOUNTER — Other Ambulatory Visit (HOSPITAL_COMMUNITY): Payer: Self-pay

## 2020-12-22 ENCOUNTER — Telehealth: Payer: Self-pay | Admitting: Internal Medicine

## 2020-12-22 MED ORDER — AMOXICILLIN-POT CLAVULANATE 875-125 MG PO TABS: 1.0000 | ORAL_TABLET | Freq: Two times a day (BID) | ORAL | 0 refills | Status: AC

## 2020-12-22 MED ORDER — BUTALBITAL-APAP-CAFFEINE 50-325-40 MG PO TABS: 1.0000 | ORAL_TABLET | Freq: Four times a day (QID) | ORAL | 0 refills | Status: DC | PRN

## 2020-12-22 MED ORDER — FLUCONAZOLE 150 MG PO TABS: 150.0000 mg | ORAL_TABLET | ORAL | 0 refills | Status: AC

## 2020-12-22 NOTE — Telephone Encounter (Signed)
Headaches, sinuis pressure, nasal congestion, soreness at back og throat for 8 days, feels some cervical lymphadenopahy and low grade fevers. Tested negative for COVID, children sick recently, requesting medication for HA and non resolving sinus symptoms.  Sent Rx for Augmentin (diflucan as well if needed for urinay) and firocet for HA

## 2020-12-23 ENCOUNTER — Other Ambulatory Visit: Payer: Self-pay | Admitting: Internal Medicine

## 2020-12-23 ENCOUNTER — Other Ambulatory Visit: Payer: 59

## 2020-12-23 DIAGNOSIS — R0981 Nasal congestion: Secondary | ICD-10-CM

## 2020-12-23 NOTE — Progress Notes (Unsigned)
See previous phone note. Feeling much better, HA well treated with Fioricet.  Did have contact with someone who has been tested Flu positive. Will obtain flu swab.

## 2020-12-24 LAB — COVID-19, FLU A+B NAA
Influenza A, NAA: NOT DETECTED
Influenza B, NAA: NOT DETECTED
SARS-CoV-2, NAA: NOT DETECTED

## 2020-12-27 ENCOUNTER — Other Ambulatory Visit (INDEPENDENT_AMBULATORY_CARE_PROVIDER_SITE_OTHER): Payer: 59

## 2020-12-27 ENCOUNTER — Other Ambulatory Visit: Payer: Self-pay | Admitting: Internal Medicine

## 2020-12-27 DIAGNOSIS — R5383 Other fatigue: Secondary | ICD-10-CM | POA: Diagnosis not present

## 2020-12-27 DIAGNOSIS — E039 Hypothyroidism, unspecified: Secondary | ICD-10-CM

## 2020-12-27 NOTE — Progress Notes (Signed)
We have upcoming visit next week.  Will obtain labs ahead of visit.

## 2020-12-27 NOTE — Addendum Note (Signed)
Addended by: Truddie Crumble on: 12/27/2020 11:58 AM   Modules accepted: Orders

## 2020-12-28 LAB — CBC WITH DIFFERENTIAL/PLATELET
Basophils Absolute: 0 10*3/uL (ref 0.0–0.2)
Basos: 1 %
EOS (ABSOLUTE): 0.1 10*3/uL (ref 0.0–0.4)
Eos: 2 %
Hematocrit: 42 % (ref 34.0–46.6)
Hemoglobin: 13.9 g/dL (ref 11.1–15.9)
Immature Grans (Abs): 0.1 10*3/uL (ref 0.0–0.1)
Immature Granulocytes: 1 %
Lymphocytes Absolute: 2.6 10*3/uL (ref 0.7–3.1)
Lymphs: 29 %
MCH: 30 pg (ref 26.6–33.0)
MCHC: 33.1 g/dL (ref 31.5–35.7)
MCV: 91 fL (ref 79–97)
Monocytes Absolute: 0.7 10*3/uL (ref 0.1–0.9)
Monocytes: 8 %
Neutrophils Absolute: 5.3 10*3/uL (ref 1.4–7.0)
Neutrophils: 59 %
Platelets: 156 10*3/uL (ref 150–450)
RBC: 4.63 x10E6/uL (ref 3.77–5.28)
RDW: 11.9 % (ref 11.7–15.4)
WBC: 8.8 10*3/uL (ref 3.4–10.8)

## 2020-12-28 LAB — CMP14 + ANION GAP
ALT: 26 IU/L (ref 0–32)
AST: 17 IU/L (ref 0–40)
Albumin/Globulin Ratio: 1.6 (ref 1.2–2.2)
Albumin: 4.4 g/dL (ref 3.8–4.8)
Alkaline Phosphatase: 116 IU/L (ref 44–121)
Anion Gap: 18 mmol/L (ref 10.0–18.0)
BUN/Creatinine Ratio: 19 (ref 9–23)
BUN: 13 mg/dL (ref 6–20)
Bilirubin Total: <0.2 mg/dL (ref 0.0–1.2)
CO2: 20 mmol/L (ref 20–29)
Calcium: 9.8 mg/dL (ref 8.7–10.2)
Chloride: 100 mmol/L (ref 96–106)
Creatinine, Ser: 0.69 mg/dL (ref 0.57–1.00)
Globulin, Total: 2.7 g/dL (ref 1.5–4.5)
Glucose: 82 mg/dL (ref 65–99)
Potassium: 4.4 mmol/L (ref 3.5–5.2)
Sodium: 138 mmol/L (ref 134–144)
Total Protein: 7.1 g/dL (ref 6.0–8.5)
eGFR: 119 mL/min/{1.73_m2} (ref >59)

## 2020-12-28 LAB — TSH: TSH: 2.02 u[IU]/mL (ref 0.450–4.500)

## 2020-12-28 LAB — T4, FREE: Free T4: 1.29 ng/dL (ref 0.82–1.77)

## 2021-01-02 ENCOUNTER — Ambulatory Visit (INDEPENDENT_AMBULATORY_CARE_PROVIDER_SITE_OTHER): Payer: 59 | Admitting: Internal Medicine

## 2021-01-02 ENCOUNTER — Encounter: Payer: Self-pay | Admitting: Internal Medicine

## 2021-01-02 ENCOUNTER — Other Ambulatory Visit: Payer: Self-pay

## 2021-01-02 ENCOUNTER — Other Ambulatory Visit (HOSPITAL_COMMUNITY): Payer: Self-pay

## 2021-01-02 VITALS — BP 117/86 | HR 84 | Temp 98.0°F | Ht 62.0 in | Wt 156.3 lb

## 2021-01-02 DIAGNOSIS — E039 Hypothyroidism, unspecified: Secondary | ICD-10-CM

## 2021-01-02 DIAGNOSIS — N914 Secondary oligomenorrhea: Secondary | ICD-10-CM | POA: Diagnosis not present

## 2021-01-02 DIAGNOSIS — F909 Attention-deficit hyperactivity disorder, unspecified type: Secondary | ICD-10-CM

## 2021-01-02 MED ORDER — LEVOTHYROXINE SODIUM 75 MCG PO TABS
ORAL_TABLET | ORAL | 3 refills | Status: DC
  Filled 2021-01-02: qty 90, 90d supply, fill #0
  Filled 2021-04-02: qty 90, 90d supply, fill #1
  Filled 2021-07-06: qty 90, 90d supply, fill #2
  Filled 2021-10-06: qty 90, 90d supply, fill #3

## 2021-01-02 MED ORDER — AMPHETAMINE-DEXTROAMPHETAMINE 10 MG PO TABS
ORAL_TABLET | Freq: Two times a day (BID) | ORAL | 0 refills | Status: DC
  Filled 2021-01-10: qty 60, 30d supply, fill #0

## 2021-01-02 MED ORDER — AMPHETAMINE-DEXTROAMPHETAMINE 10 MG PO TABS
ORAL_TABLET | Freq: Two times a day (BID) | ORAL | 0 refills | Status: DC
  Filled 2021-03-13: qty 60, 30d supply, fill #0

## 2021-01-02 MED ORDER — AMPHETAMINE-DEXTROAMPHETAMINE 10 MG PO TABS
ORAL_TABLET | Freq: Two times a day (BID) | ORAL | 0 refills | Status: DC
  Filled 2021-02-10: qty 60, 30d supply, fill #0

## 2021-01-02 MED ORDER — TRAZODONE HCL 50 MG PO TABS
ORAL_TABLET | Freq: Every evening | ORAL | 1 refills | Status: DC | PRN
  Filled 2021-01-02 – 2021-01-13 (×2): qty 90, 90d supply, fill #0
  Filled 2021-04-15: qty 90, 90d supply, fill #1

## 2021-01-02 NOTE — Progress Notes (Signed)
Subjective:  HPI: [name] is a 30 y.o. female who presents for irregular perioids, Hypothyroidism follow up, ADHD follow up  She reports she has had absent periods until a few months ago, then irregular spotting, last possible uterine bleedings last Saturday, then no bleeding Sunday, followed by slight bleeding on Monday. Initially she felt it was likely postparrtum related, then hypothoidism related but now somewhat concerned given hypothyroid tx.  Fatigue overall better on synthroid (75 mcg) (six weeks of marked improvement and resolution of constipation and dry skin)  Still some fatigue wonders if normal due to job/kids.  Still having hair thinning.    Sinus infection symptoms started April 30th.  After about 2 weeks I sent in Augmentin Rx, that helped some but less markedly then previous time.  Still having some nasal congestion/ PND but all improving.  Taking claritin.  HA have resolved after improvement of sinus symptoms.  ADHD>> adderall continues to help focus.  Still contemplating weaning off entirely however given fatigue and new issues has decided to delay decision.  Foot issues all resolved  Please see Assessment and Plan below for the status of her chronic medical problems.  Objective:  Physical Exam: Vitals:   01/02/21 1058  BP: 117/86  Pulse: 84  Temp: 98 F (36.7 C)  TempSrc: Oral  SpO2: 98%  Weight: 156 lb 4.8 oz (70.9 kg)  Height: 5' 2"  (1.575 m)   Body mass index is 28.59 kg/m. Physical Exam Vitals and nursing note reviewed.  Constitutional:      Appearance: Normal appearance. She is normal weight.  Cardiovascular:     Rate and Rhythm: Normal rate and regular rhythm.     Heart sounds: No murmur heard.   Pulmonary:     Effort: Pulmonary effort is normal.     Breath sounds: Normal breath sounds. No wheezing, rhonchi or rales.  Skin:    General: Skin is warm.     Nails: There is no clubbing.     Comments: Mild hair thinning on scalp   Neurological:     Mental Status: She is alert. Mental status is at baseline.  Psychiatric:        Mood and Affect: Mood normal.        Behavior: Behavior normal.        Thought Content: Thought content normal.        Judgment: Judgment normal.    Assessment & Plan:  See Encounters Tab for problem based charting.  Medications Ordered Meds ordered this encounter  Medications  . levothyroxine (SYNTHROID) 75 MCG tablet    Sig: TAKE 1 TABLET BY MOUTH ONCE A DAY BEFORE BREAKFAST    Dispense:  90 tablet    Refill:  3  . amphetamine-dextroamphetamine (ADDERALL) 10 MG tablet    Sig: TAKE 1 TABLET BY MOUTH TWICE DAILY.  Do not fill until 01/10/21    Dispense:  60 tablet    Refill:  0    Rx 1/3  . amphetamine-dextroamphetamine (ADDERALL) 10 MG tablet    Sig: TAKE 1 TABLET BY MOUTH TWICE DAILY.  do not fill until 02/09/21    Dispense:  60 tablet    Refill:  0    Rx 2/3  . amphetamine-dextroamphetamine (ADDERALL) 10 MG tablet    Sig: TAKE 1 TABLET BY MOUTH TWICE DAILY.  do not fill until 03/11/21    Dispense:  60 tablet    Refill:  0    Rx 3/3  . traZODone (DESYREL) 50  MG tablet    Sig: TAKE 1 TABLET BY MOUTH AT BEDTIME AS NEEDED FOR SLEEP    Dispense:  90 tablet    Refill:  1   Other Orders Orders Placed This Encounter  Procedures  . Cortisol    Standing Status:   Future    Standing Expiration Date:   01/02/2022  . Estradiol    Standing Status:   Future    Standing Expiration Date:   01/02/2022  . LH    Standing Status:   Future    Standing Expiration Date:   01/02/2022  . [place]    Standing Status:   Future    Standing Expiration Date:   01/02/2022  . Prolactin    Standing Status:   Future    Standing Expiration Date:   01/02/2022   Follow Up: Return in about 6 months (around 07/05/2021).   Marland Kitchen

## 2021-01-03 ENCOUNTER — Other Ambulatory Visit (HOSPITAL_COMMUNITY): Payer: Self-pay

## 2021-01-03 DIAGNOSIS — N914 Secondary oligomenorrhea: Secondary | ICD-10-CM | POA: Insufficient documentation

## 2021-01-03 MED FILL — Bupropion HCl Tab ER 24HR 300 MG: ORAL | 90 days supply | Qty: 90 | Fill #0 | Status: AC

## 2021-01-03 NOTE — Assessment & Plan Note (Signed)
She was not noted to have significant blood loss during her last delivery.  She did have slightly lower blood pressures which would put her at risk for sheenan syndrome.  However her elevated TSH would suggest that she certainly is not pan hypo-pit.  We will check estradiol LH FSH prolactin and AM cortisol.  She has follow-up next month with her OB/GYN.

## 2021-01-03 NOTE — Assessment & Plan Note (Signed)
Continues to see benefit from Adderall.  Continue Adderall 10 mg twice daily as needed

## 2021-01-03 NOTE — Assessment & Plan Note (Signed)
As noted most hypothyroidism symptoms have improved with treatment with levothyroxine she continues on 75 mcg daily.  TSH and free T4 now within normal limits.

## 2021-01-04 ENCOUNTER — Other Ambulatory Visit (HOSPITAL_COMMUNITY): Payer: Self-pay

## 2021-01-06 ENCOUNTER — Other Ambulatory Visit (INDEPENDENT_AMBULATORY_CARE_PROVIDER_SITE_OTHER): Payer: 59

## 2021-01-06 ENCOUNTER — Other Ambulatory Visit: Payer: Self-pay

## 2021-01-06 DIAGNOSIS — N914 Secondary oligomenorrhea: Secondary | ICD-10-CM | POA: Diagnosis not present

## 2021-01-07 LAB — FOLLICLE STIMULATING HORMONE: FSH: 5.6 m[IU]/mL

## 2021-01-07 LAB — ESTRADIOL: Estradiol: 81.1 pg/mL

## 2021-01-07 LAB — PROLACTIN: Prolactin: 11.6 ng/mL (ref 4.8–23.3)

## 2021-01-07 LAB — CORTISOL: Cortisol: 6.7 ug/dL

## 2021-01-07 LAB — LUTEINIZING HORMONE: LH: 6.6 m[IU]/mL

## 2021-01-10 ENCOUNTER — Other Ambulatory Visit (HOSPITAL_COMMUNITY): Payer: Self-pay

## 2021-01-14 ENCOUNTER — Other Ambulatory Visit (HOSPITAL_COMMUNITY): Payer: Self-pay

## 2021-01-28 DIAGNOSIS — Z20822 Contact with and (suspected) exposure to covid-19: Secondary | ICD-10-CM | POA: Diagnosis not present

## 2021-01-31 DIAGNOSIS — Z01419 Encounter for gynecological examination (general) (routine) without abnormal findings: Secondary | ICD-10-CM | POA: Diagnosis not present

## 2021-01-31 DIAGNOSIS — Z124 Encounter for screening for malignant neoplasm of cervix: Secondary | ICD-10-CM | POA: Diagnosis not present

## 2021-01-31 DIAGNOSIS — Z6827 Body mass index (BMI) 27.0-27.9, adult: Secondary | ICD-10-CM | POA: Diagnosis not present

## 2021-02-03 DIAGNOSIS — N939 Abnormal uterine and vaginal bleeding, unspecified: Secondary | ICD-10-CM | POA: Insufficient documentation

## 2021-02-10 ENCOUNTER — Other Ambulatory Visit (HOSPITAL_COMMUNITY): Payer: Self-pay

## 2021-02-18 ENCOUNTER — Encounter: Payer: Self-pay | Admitting: *Deleted

## 2021-03-13 ENCOUNTER — Other Ambulatory Visit (HOSPITAL_COMMUNITY): Payer: Self-pay

## 2021-04-02 ENCOUNTER — Other Ambulatory Visit (HOSPITAL_COMMUNITY): Payer: Self-pay

## 2021-04-07 ENCOUNTER — Telehealth: Payer: Self-pay | Admitting: Internal Medicine

## 2021-04-07 ENCOUNTER — Other Ambulatory Visit (HOSPITAL_COMMUNITY): Payer: Self-pay

## 2021-04-07 MED ORDER — ZOLPIDEM TARTRATE 5 MG PO TABS
5.0000 mg | ORAL_TABLET | Freq: Every evening | ORAL | 1 refills | Status: DC | PRN
  Filled 2021-04-07: qty 30, 30d supply, fill #0
  Filled 2021-05-09: qty 30, 30d supply, fill #1

## 2021-04-07 MED ORDER — NURTEC 75 MG PO TBDP
1.0000 | ORAL_TABLET | Freq: Every day | ORAL | 1 refills | Status: DC | PRN
  Filled 2021-04-07: qty 8, 30d supply, fill #0
  Filled 2021-05-02: qty 8, 30d supply, fill #1
  Filled 2021-06-01: qty 8, 30d supply, fill #2

## 2021-04-07 NOTE — Telephone Encounter (Signed)
[name] is requesting a refill of Nurtec. She has recently begun taking this medication on a PRN basis after receiving a sample when she was [place].  She has found it to be remaarkably effective at aborting migraine symptoms.   -Refill of Nurtec 75m daily PRN Also requesting refill of ambien 575mwhich she has taken very sparingly. -Refill ambien.

## 2021-04-08 ENCOUNTER — Other Ambulatory Visit (HOSPITAL_COMMUNITY): Payer: Self-pay

## 2021-04-09 ENCOUNTER — Other Ambulatory Visit (HOSPITAL_COMMUNITY): Payer: Self-pay

## 2021-04-10 ENCOUNTER — Other Ambulatory Visit (HOSPITAL_COMMUNITY): Payer: Self-pay

## 2021-04-10 ENCOUNTER — Telehealth: Payer: Self-pay | Admitting: *Deleted

## 2021-04-10 NOTE — Telephone Encounter (Signed)
PA for Nurtec 75 mg sent through CoverMyMeds to [place].  Approved for a maximum of 6 refills from 04/10/2021 thru 10/10/2022 as long as patient continues with present insurance.  Quantify limit of 18 with a 30 day supply .  Sander Nephew, RN 04/10/2021  10:41 AM.

## 2021-04-15 MED FILL — Bupropion HCl Tab ER 24HR 300 MG: ORAL | 90 days supply | Qty: 90 | Fill #1 | Status: AC

## 2021-04-16 ENCOUNTER — Other Ambulatory Visit (HOSPITAL_COMMUNITY): Payer: Self-pay

## 2021-05-01 ENCOUNTER — Encounter: Payer: Self-pay | Admitting: Internal Medicine

## 2021-05-01 ENCOUNTER — Ambulatory Visit (INDEPENDENT_AMBULATORY_CARE_PROVIDER_SITE_OTHER): Payer: 59 | Admitting: Internal Medicine

## 2021-05-01 ENCOUNTER — Other Ambulatory Visit: Payer: Self-pay

## 2021-05-01 ENCOUNTER — Other Ambulatory Visit (HOSPITAL_COMMUNITY): Payer: Self-pay

## 2021-05-01 VITALS — BP 127/98 | HR 65 | Temp 98.1°F | Ht 62.0 in | Wt 156.4 lb

## 2021-05-01 DIAGNOSIS — M1712 Unilateral primary osteoarthritis, left knee: Secondary | ICD-10-CM

## 2021-05-01 DIAGNOSIS — E663 Overweight: Secondary | ICD-10-CM | POA: Diagnosis not present

## 2021-05-01 DIAGNOSIS — Z6828 Body mass index (BMI) 28.0-28.9, adult: Secondary | ICD-10-CM | POA: Diagnosis not present

## 2021-05-01 DIAGNOSIS — E039 Hypothyroidism, unspecified: Secondary | ICD-10-CM

## 2021-05-01 DIAGNOSIS — F909 Attention-deficit hyperactivity disorder, unspecified type: Secondary | ICD-10-CM

## 2021-05-01 DIAGNOSIS — R7303 Prediabetes: Secondary | ICD-10-CM

## 2021-05-01 DIAGNOSIS — G4700 Insomnia, unspecified: Secondary | ICD-10-CM | POA: Diagnosis not present

## 2021-05-01 DIAGNOSIS — Z8669 Personal history of other diseases of the nervous system and sense organs: Secondary | ICD-10-CM | POA: Diagnosis not present

## 2021-05-01 MED ORDER — TRAZODONE HCL 50 MG PO TABS
ORAL_TABLET | Freq: Every evening | ORAL | 2 refills | Status: DC | PRN
  Filled 2021-05-01 – 2021-07-22 (×2): qty 90, 90d supply, fill #0
  Filled 2021-10-06: qty 90, 90d supply, fill #1

## 2021-05-01 NOTE — Progress Notes (Signed)
Subjective:  HPI: Catherine Chang is a 30 y.o. female who presents for f/u hypothyroidism, migraines, weight  Since her last visit she started taking Nurtec for abortive therapy for migraines.  This has worked remarkably well and she is very pleased with the medication.  She has been trying to lose weight for the last several months she has been exercising regularly on peloton, 3-5 days, dog walking several miles.  She initially has some success but that has tapered off, she is now stuck at 156 for the last 4 months.    For her ADHD she has been doing well.  Recently she has tapered herself off she is going to try to remain without the medication and see how she is able to function with job activities.  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  She feels much better at this time and is actually started to taper herself off the medication.  She thinks she feels better now off medication and no longer has as much needed.  She has been experiencing some increased left knee pain this may be worse due to her increased exercise routine.  She has noticed poping, crepitus, slight bluge in left knee.  And occasionally feels some fullness at the posterior aspect of her knee.  She is taking over-the-counter nonsteroidals and rested the knee at times which is resulted in improvement pain is not too bothersome today.   Her Periods overall normalized>> last 2 days, regular but shorter, she did not follow up with OB for further testing due to COVID infection and has not rescheduled given normalization of symptoms.   Please see Assessment and Plan below for the status of her chronic medical problems.  Objective:  Physical Exam: Vitals:   05/01/21 1108  BP: 130/90  Pulse: 75  Temp: 98.1 F (36.7 C)  SpO2: 100%  Weight: 156 lb 6.4 oz (70.9 kg)  Height: 5' 2"  (1.575 m)   Body mass index is 28.61 kg/m. Physical Exam Vitals and nursing  note reviewed.  Constitutional:      Appearance: Normal appearance. She is not ill-appearing.  Pulmonary:     Effort: Pulmonary effort is normal.  Musculoskeletal:     Right knee: No deformity, effusion, erythema or ecchymosis. Normal range of motion. No LCL laxity, MCL laxity, ACL laxity or PCL laxity. Normal meniscus and normal patellar mobility.     Instability Tests: Anterior drawer test negative. Posterior drawer test negative.     Left knee: Effusion present. No deformity, erythema or ecchymosis. Normal range of motion. No LCL laxity, MCL laxity, ACL laxity or PCL laxity.Normal meniscus and normal patellar mobility.     Instability Tests: Anterior drawer test negative. Posterior drawer test negative.     Comments: Mild pain at lcl with varus stress, no instability  Neurological:     Mental Status: She is alert.  Psychiatric:        Mood and Affect: Mood normal.        Thought Content: Thought content normal.   Assessment & Plan:  See Encounters Tab for problem based charting.  Medications Ordered Meds ordered this encounter  Medications   traZODone (DESYREL) 50 MG tablet    Sig: TAKE 1 TABLET BY MOUTH AT BEDTIME AS NEEDED FOR SLEEP    Dispense:  90 tablet    Refill:  2    Place on file   Other Orders Orders Placed This Encounter  Procedures  Lipid Profile   TSH   Hemoglobin A1c   Follow Up: Return 1-2 months.

## 2021-05-02 ENCOUNTER — Other Ambulatory Visit (HOSPITAL_COMMUNITY): Payer: Self-pay

## 2021-05-02 DIAGNOSIS — M1712 Unilateral primary osteoarthritis, left knee: Secondary | ICD-10-CM | POA: Insufficient documentation

## 2021-05-02 DIAGNOSIS — Z6828 Body mass index (BMI) 28.0-28.9, adult: Secondary | ICD-10-CM | POA: Insufficient documentation

## 2021-05-02 DIAGNOSIS — R7303 Prediabetes: Secondary | ICD-10-CM | POA: Insufficient documentation

## 2021-05-02 DIAGNOSIS — Z6827 Body mass index (BMI) 27.0-27.9, adult: Secondary | ICD-10-CM | POA: Insufficient documentation

## 2021-05-02 DIAGNOSIS — E663 Overweight: Secondary | ICD-10-CM | POA: Insufficient documentation

## 2021-05-02 LAB — LIPID PANEL
Chol/HDL Ratio: 2.8 ratio (ref 0.0–4.4)
Cholesterol, Total: 183 mg/dL (ref 100–199)
HDL: 65 mg/dL (ref >39)
LDL Chol Calc (NIH): 108 mg/dL — ABNORMAL HIGH (ref 0–99)
Triglycerides: 54 mg/dL (ref 0–149)
VLDL Cholesterol Cal: 10 mg/dL (ref 5–40)

## 2021-05-02 LAB — HEMOGLOBIN A1C
Est. average glucose Bld gHb Est-mCnc: 117 mg/dL
Hgb A1c MFr Bld: 5.7 % — ABNORMAL HIGH (ref 4.8–5.6)

## 2021-05-02 LAB — TSH: TSH: 1 u[IU]/mL (ref 0.450–4.500)

## 2021-05-02 MED ORDER — OZEMPIC (0.25 OR 0.5 MG/DOSE) 2 MG/1.5ML ~~LOC~~ SOPN
PEN_INJECTOR | SUBCUTANEOUS | 1 refills | Status: DC
  Filled 2021-05-02: qty 1.5, 42d supply, fill #0

## 2021-05-02 NOTE — Assessment & Plan Note (Signed)
She is currently self weaning Adderall and will let me know if she needs future refill or able to function well off the medication.

## 2021-05-02 NOTE — Assessment & Plan Note (Signed)
She has a mild left knee joint effusion.  Her history is consistent with osteoarthritis no red flag features.  I do not think we need x-rays yet.  I stressed the importance of weight loss as the mainstay of treatment to prevent progression.  If the effusion worsens we may need to evaluate for a Baker's cyst and obtain x-rays but her pain is minimal today and effusion seems quite small.

## 2021-05-02 NOTE — Assessment & Plan Note (Signed)
She has a history of impaired glucose tolerance during pregnancy.  Her BMI is 28 and she has brought this down from being above 30 last year but weight loss has stalled.  Given that she now has an elevated A1c of 5.7 consistent with prediabetes as well as knee pain consistent with osteoarthritis weight loss is certainly important. Given her elevated BMI and obesity related condition she is a candidate for injection semaglutide.

## 2021-05-02 NOTE — Assessment & Plan Note (Signed)
Nurtec is working remarkably well continue this for abortive therapy.

## 2021-05-02 NOTE — Assessment & Plan Note (Signed)
Overall trazodone has been working for insomnia taken as needed.  Ambien works even better but she tries to make sure she is not taking this very frequently and is only used it when trazodone is ineffective.

## 2021-05-02 NOTE — Assessment & Plan Note (Signed)
Overall feels good on 75 mcg of Synthroid TSH has been appropriate we are obtaining blood again today and will recheck TSH.

## 2021-05-02 NOTE — Assessment & Plan Note (Signed)
She has done well with diet and exercise and reduced her BMI from over 30 down to 28.  However weight loss has stalled despite continued exercise.  She does have a history of impaired glucose tolerance during pregnancy as well as some gestational hypertension making weight control even more important to prevent development of diabetes and hypertension.  Blood pressure additionally was also mildly elevated at this office visit but has not been consistently elevated.  I checked A1c and lipid panel today.  Her A1c returned at 5.7 in the prediabetes range.  Given her elevated BMI, prediabetes and osteoarthritis of the left knee which is obesity related she would definitely benefit from weight loss medication.  I would like to start injection semaglutide.

## 2021-05-05 ENCOUNTER — Other Ambulatory Visit (HOSPITAL_COMMUNITY): Payer: Self-pay

## 2021-05-09 ENCOUNTER — Other Ambulatory Visit (HOSPITAL_COMMUNITY): Payer: Self-pay

## 2021-06-02 ENCOUNTER — Other Ambulatory Visit (HOSPITAL_COMMUNITY): Payer: Self-pay

## 2021-06-03 ENCOUNTER — Telehealth: Payer: Self-pay | Admitting: Internal Medicine

## 2021-06-03 ENCOUNTER — Other Ambulatory Visit (HOSPITAL_COMMUNITY): Payer: Self-pay

## 2021-06-03 MED ORDER — AMOXICILLIN-POT CLAVULANATE 875-125 MG PO TABS
1.0000 | ORAL_TABLET | Freq: Two times a day (BID) | ORAL | 0 refills | Status: AC
  Filled 2021-06-03: qty 14, 7d supply, fill #0

## 2021-06-03 MED ORDER — FLUCONAZOLE 150 MG PO TABS
150.0000 mg | ORAL_TABLET | Freq: Every day | ORAL | 2 refills | Status: DC
  Filled 2021-06-03: qty 1, 1d supply, fill #0

## 2021-06-03 MED ORDER — OZEMPIC (0.25 OR 0.5 MG/DOSE) 2 MG/1.5ML ~~LOC~~ SOPN
PEN_INJECTOR | SUBCUTANEOUS | 1 refills | Status: DC
  Filled 2021-06-03: qty 1.5, 21d supply, fill #0
  Filled 2021-06-04: qty 1.5, 28d supply, fill #0

## 2021-06-03 NOTE — Telephone Encounter (Signed)
Kasheena called, requesting Augmentin, has had what started out as viral URI for almost 2 weeks, started after kids sick, cant seem to clear and has history of needing Abx after prolonged symptoms. Occasionalyl gets candidal vaginitis after abx will probide fluconazole as needed.   Semaglutide is working well she has titrated up to 0.89m dose and needs refill.

## 2021-06-04 ENCOUNTER — Other Ambulatory Visit (HOSPITAL_COMMUNITY): Payer: Self-pay

## 2021-06-04 MED ORDER — OZEMPIC (1 MG/DOSE) 4 MG/3ML ~~LOC~~ SOPN
1.0000 mg | PEN_INJECTOR | SUBCUTANEOUS | 2 refills | Status: DC
  Filled 2021-06-04: qty 3, 28d supply, fill #0
  Filled 2021-07-22: qty 3, 28d supply, fill #1

## 2021-06-04 NOTE — Addendum Note (Signed)
Addended by: Joni Reining C on: 06/04/2021 01:56 PM   Modules accepted: Orders

## 2021-06-13 ENCOUNTER — Telehealth: Payer: Self-pay | Admitting: Internal Medicine

## 2021-06-13 ENCOUNTER — Other Ambulatory Visit (HOSPITAL_COMMUNITY): Payer: Self-pay

## 2021-06-13 MED ORDER — BUTALBITAL-APAP-CAFFEINE 50-325-40 MG PO TABS
1.0000 | ORAL_TABLET | Freq: Four times a day (QID) | ORAL | 0 refills | Status: DC | PRN
  Filled 2021-06-13: qty 20, 3d supply, fill #0

## 2021-06-13 NOTE — Telephone Encounter (Signed)
Having increased migraine headaches, nurtec has been working but taking QOD (16 per month, taking every other day) on off days taking excedrine which does not completely work, having some increased stress currently, went back on wellbutrin, requesting temporary Rx of fioricet.

## 2021-06-17 ENCOUNTER — Other Ambulatory Visit (HOSPITAL_COMMUNITY): Payer: Self-pay

## 2021-06-17 ENCOUNTER — Telehealth: Payer: Self-pay | Admitting: Internal Medicine

## 2021-06-17 MED ORDER — AMPHETAMINE-DEXTROAMPHETAMINE 10 MG PO TABS
ORAL_TABLET | Freq: Two times a day (BID) | ORAL | 0 refills | Status: DC
  Filled 2021-06-17: qty 60, 30d supply, fill #0

## 2021-06-17 NOTE — Telephone Encounter (Signed)
Wants to return to taking adderall for ADHD, has follow up with me this week.  Will go ahead and refill the prescription and follow up at our visit.

## 2021-06-19 ENCOUNTER — Other Ambulatory Visit: Payer: Self-pay

## 2021-06-19 ENCOUNTER — Encounter: Payer: Self-pay | Admitting: Internal Medicine

## 2021-06-19 ENCOUNTER — Ambulatory Visit (INDEPENDENT_AMBULATORY_CARE_PROVIDER_SITE_OTHER): Payer: 59 | Admitting: Internal Medicine

## 2021-06-19 ENCOUNTER — Other Ambulatory Visit (HOSPITAL_COMMUNITY): Payer: Self-pay

## 2021-06-19 VITALS — BP 115/69 | HR 72 | Temp 97.6°F | Ht 62.0 in | Wt 153.7 lb

## 2021-06-19 DIAGNOSIS — F909 Attention-deficit hyperactivity disorder, unspecified type: Secondary | ICD-10-CM | POA: Diagnosis not present

## 2021-06-19 DIAGNOSIS — F3341 Major depressive disorder, recurrent, in partial remission: Secondary | ICD-10-CM

## 2021-06-19 DIAGNOSIS — G4700 Insomnia, unspecified: Secondary | ICD-10-CM | POA: Diagnosis not present

## 2021-06-19 DIAGNOSIS — R7303 Prediabetes: Secondary | ICD-10-CM | POA: Diagnosis not present

## 2021-06-19 DIAGNOSIS — G43919 Migraine, unspecified, intractable, without status migrainosus: Secondary | ICD-10-CM

## 2021-06-19 DIAGNOSIS — Z6828 Body mass index (BMI) 28.0-28.9, adult: Secondary | ICD-10-CM

## 2021-06-19 DIAGNOSIS — E663 Overweight: Secondary | ICD-10-CM | POA: Diagnosis not present

## 2021-06-19 DIAGNOSIS — R102 Pelvic and perineal pain: Secondary | ICD-10-CM

## 2021-06-19 DIAGNOSIS — R300 Dysuria: Secondary | ICD-10-CM

## 2021-06-19 LAB — POCT URINALYSIS DIPSTICK
Bilirubin, UA: NEGATIVE
Glucose, UA: NEGATIVE
Ketones, UA: NEGATIVE
Leukocytes, UA: NEGATIVE
Nitrite, UA: NEGATIVE
Protein, UA: NEGATIVE
Spec Grav, UA: 1.02 (ref 1.010–1.025)
Urobilinogen, UA: 0.2 E.U./dL
pH, UA: 6 (ref 5.0–8.0)

## 2021-06-19 MED ORDER — NURTEC 75 MG PO TBDP
1.0000 | ORAL_TABLET | Freq: Every day | ORAL | 5 refills | Status: DC | PRN
  Filled 2021-06-19 – 2021-06-20 (×2): qty 16, 60d supply, fill #0
  Filled 2021-06-25: qty 8, 30d supply, fill #0
  Filled 2021-07-22: qty 8, 30d supply, fill #1
  Filled 2021-08-25: qty 8, 30d supply, fill #2
  Filled 2021-09-15 – 2021-10-08 (×2): qty 8, 30d supply, fill #3

## 2021-06-19 NOTE — Progress Notes (Signed)
  Subjective:  HPI: [name] is a 30 y.o. female who presents for multiple issues some of which have been addressed recently by telephone visits, ADHD, Depression, Migraines  Please see Assessment and Plan below for the status of her chronic medical problems.  Objective:  Physical Exam: Vitals:   06/19/21 0843  BP: 115/69  Pulse: 72  Temp: 97.6 F (36.4 C)  TempSrc: Oral  SpO2: 100%  Weight: 153 lb 11.2 oz (69.7 kg)  Height: 5' 2"  (1.575 m)   Body mass index is 28.11 kg/m. Physical Exam Vitals and nursing note reviewed.  Constitutional:      Appearance: Normal appearance.  HENT:     Head: Normocephalic and atraumatic.  Eyes:     Conjunctiva/sclera: Conjunctivae normal.  Pulmonary:     Effort: Pulmonary effort is normal.  Neurological:     Mental Status: She is alert. Mental status is at baseline.  Psychiatric:        Mood and Affect: Mood normal.        Behavior: Behavior normal.        Judgment: Judgment normal.   Assessment & Plan:  See Encounters Tab for problem based charting.  Medications Ordered Meds ordered this encounter  Medications   Rimegepant Sulfate (NURTEC) 75 MG TBDP    Sig: Dissolve 1 tablet by mouth daily as needed for migraine.    Dispense:  16 tablet    Refill:  5   amphetamine-dextroamphetamine (ADDERALL) 10 MG tablet    Sig: TAKE 1 TABLET BY MOUTH TWICE DAILY.    Dispense:  60 tablet    Refill:  0    Rx 1/2   amphetamine-dextroamphetamine (ADDERALL) 10 MG tablet    Sig: TAKE 1 TABLET BY MOUTH TWICE DAILY.    Dispense:  60 tablet    Refill:  0    Rx 2/2   buPROPion (WELLBUTRIN SR) 150 MG 12 hr tablet    Sig: Take 1 tablet (150 mg total) by mouth daily.    Dispense:  90 tablet    Refill:  3   Other Orders Orders Placed This Encounter  Procedures   Urinalysis, Reflex Microscopic   Ambulatory referral to Neurology    Referral Priority:   Routine    Referral Type:   Consultation    Referral Reason:   Specialty Services  Required    Requested Specialty:   Neurology    Number of Visits Requested:   1   POCT Urinalysis Dipstick ([id number])   Follow Up: No follow-ups on file.

## 2021-06-20 ENCOUNTER — Other Ambulatory Visit (HOSPITAL_COMMUNITY): Payer: Self-pay

## 2021-06-20 DIAGNOSIS — F3341 Major depressive disorder, recurrent, in partial remission: Secondary | ICD-10-CM

## 2021-06-20 HISTORY — DX: Major depressive disorder, recurrent, in partial remission: F33.41

## 2021-06-20 LAB — URINALYSIS, ROUTINE W REFLEX MICROSCOPIC
Bilirubin, UA: NEGATIVE
Glucose, UA: NEGATIVE
Ketones, UA: NEGATIVE
Leukocytes,UA: NEGATIVE
Nitrite, UA: NEGATIVE
Protein,UA: NEGATIVE
RBC, UA: NEGATIVE
Specific Gravity, UA: 1.015 (ref 1.005–1.030)
Urobilinogen, Ur: 0.2 mg/dL (ref 0.2–1.0)
pH, UA: 6.5 (ref 5.0–7.5)

## 2021-06-20 MED ORDER — BUPROPION HCL ER (SR) 150 MG PO TB12
150.0000 mg | ORAL_TABLET | Freq: Every day | ORAL | 3 refills | Status: DC
  Filled 2021-06-20: qty 90, 90d supply, fill #0
  Filled 2021-10-06: qty 90, 90d supply, fill #1

## 2021-06-20 MED ORDER — AMPHETAMINE-DEXTROAMPHETAMINE 10 MG PO TABS: ORAL_TABLET | Freq: Two times a day (BID) | ORAL | 0 refills | Status: DC

## 2021-06-20 MED ORDER — AMPHETAMINE-DEXTROAMPHETAMINE 10 MG PO TABS
ORAL_TABLET | Freq: Two times a day (BID) | ORAL | 0 refills | Status: DC
  Filled 2021-07-17: qty 60, 30d supply, fill #0

## 2021-06-20 NOTE — Assessment & Plan Note (Addendum)
Over about the past 2 weeks Catherine Chang has experienced an increase in her migraine frequency.  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  Nurtec has been a remarkable medication the days in which she takes it her headaches resolve however she can only take this every other day.  On those off days recently she has not found relief with Excedrin or Tylenol.  We discussed a few days ago and I sent in a prescription for Fioricet which did help break her headache she has not needed that since that day.  She has a strong family history of migraines as well as obstructive sleep apnea she notes that her father was diagnosed with OSA in his 84s during evaluation of chronic migraines.  Her younger brother was also diagnosed with OSA in his 69s and is currently on CPAP therapy.  Even her grandfather passed away from suspected OSA at age 32 (died in sleep).  She does actively snore her partner has never noticed apneic episodes.  BMI is 28.  We discussed today potentially obtaining a home sleep study. And also potential referral to Neurology.  Given her recent increased migraine frequency I think neurology evaluation would be a good idea.  I certainly think a sleep study would also be a good idea with her strong family history.  A home sleep study should be sufficient for OSA evaluation but I may hold off till neurology evaluation to see if a formal sleep center evaluation may be more appropriate.  Other recent changes that may be playing a role: recent initiation of injection semaglutide for weight loss/prediabetes, weaning off wellbutrin for depression (recently restarted at lower dose 124m) and weaning off adderall for adhd (restarted 2 days ago)

## 2021-06-20 NOTE — Assessment & Plan Note (Signed)
In addition to the Adderall she was hopeful to wean herself off of Wellbutrin completely.  She did this but then felt increased depressive symptoms she has started back at the lower dose of 150 mg and hopes this will be control her symptoms.

## 2021-06-20 NOTE — Assessment & Plan Note (Signed)
Since starting injections Catherine Chang 6 her weight has decreased by 5.5 pounds she is pleased with some of the appetite suppression properties of the medication and is overall tolerating it well she is currently at 0.5 mg dose.  She is especially pleased that she was able to lose that weight despite attending multiple weddings over the past 2 months.

## 2021-06-20 NOTE — Assessment & Plan Note (Signed)
Overall think she is doing well with the injection semaglutide.

## 2021-06-20 NOTE — Assessment & Plan Note (Signed)
She actually went several months without using Adderall.  She feels more outgoing when not using the medication but thinks that her productivity suffers.  Recently with increased stress at work she asked about resuming this I sent a prescription 2 days ago which she was able to feel it she thinks that it is helping so far and will update me if she has any issues.

## 2021-06-23 ENCOUNTER — Other Ambulatory Visit (HOSPITAL_COMMUNITY): Payer: Self-pay

## 2021-06-24 ENCOUNTER — Ambulatory Visit (INDEPENDENT_AMBULATORY_CARE_PROVIDER_SITE_OTHER): Payer: 59 | Admitting: Internal Medicine

## 2021-06-24 ENCOUNTER — Encounter: Payer: Self-pay | Admitting: Internal Medicine

## 2021-06-24 VITALS — BP 127/83 | HR 74 | Temp 98.1°F

## 2021-06-24 DIAGNOSIS — R1031 Right lower quadrant pain: Secondary | ICD-10-CM | POA: Diagnosis not present

## 2021-06-24 LAB — COMPREHENSIVE METABOLIC PANEL
ALT: 25 U/L (ref 0–44)
AST: 19 U/L (ref 15–41)
Albumin: 4.1 g/dL (ref 3.5–5.0)
Alkaline Phosphatase: 73 U/L (ref 38–126)
Anion gap: 10 (ref 5–15)
BUN: 12 mg/dL (ref 6–20)
CO2: 24 mmol/L (ref 22–32)
Calcium: 9.4 mg/dL (ref 8.9–10.3)
Chloride: 102 mmol/L (ref 98–111)
Creatinine, Ser: 0.84 mg/dL (ref 0.44–1.00)
GFR, Estimated: >60 mL/min (ref >60)
Glucose, Bld: 97 mg/dL (ref 70–99)
Potassium: 4.4 mmol/L (ref 3.5–5.1)
Sodium: 136 mmol/L (ref 135–145)
Total Bilirubin: 0.6 mg/dL (ref 0.3–1.2)
Total Protein: 6.9 g/dL (ref 6.5–8.1)

## 2021-06-24 LAB — CBC WITH DIFFERENTIAL/PLATELET
Abs Immature Granulocytes: 0.04 10*3/uL (ref 0.00–0.07)
Basophils Absolute: 0 10*3/uL (ref 0.0–0.1)
Basophils Relative: 0 %
Eosinophils Absolute: 0 10*3/uL (ref 0.0–0.5)
Eosinophils Relative: 0 %
HCT: 40.2 % (ref 36.0–46.0)
Hemoglobin: 13.4 g/dL (ref 12.0–15.0)
Immature Granulocytes: 1 %
Lymphocytes Relative: 32 %
Lymphs Abs: 2.7 10*3/uL (ref 0.7–4.0)
MCH: 30.7 pg (ref 26.0–34.0)
MCHC: 33.3 g/dL (ref 30.0–36.0)
MCV: 92 fL (ref 80.0–100.0)
Monocytes Absolute: 0.6 10*3/uL (ref 0.1–1.0)
Monocytes Relative: 7 %
Neutro Abs: 5 10*3/uL (ref 1.7–7.7)
Neutrophils Relative %: 60 %
Platelets: 363 10*3/uL (ref 150–400)
RBC: 4.37 MIL/uL (ref 3.87–5.11)
RDW: 12.2 % (ref 11.5–15.5)
WBC: 8.4 10*3/uL (ref 4.0–10.5)
nRBC: 0 % (ref 0.0–0.2)

## 2021-06-24 LAB — LIPASE, BLOOD: Lipase: 37 U/L (ref 11–51)

## 2021-06-24 LAB — POCT URINE PREGNANCY: Preg Test, Ur: NEGATIVE

## 2021-06-24 NOTE — Assessment & Plan Note (Signed)
Her RLQ pain has now been present for about 5-6 days, it is worsening and not improving. Previously checked UA without signs of infection or hematuria.  Pain is not RUQ and murphys negative.  Will check CBC, CMP, Lipase, and Preg (has history of tubal ligation).  Need to rule out appendicitis with CT.

## 2021-06-24 NOTE — Progress Notes (Signed)
  Subjective:  HPI: [name] is a 30 y.o. female who presents for abdominal pain.  I saw her last Thursday for routine follow up.  She had started having some suprapubic pain the night before after eating stirfry.  We were concerned at the time for possible UTI and obtained a  UA that was normal.  Over the past few days she has continued to have RLQ abominal pain that is dull but also occasionally sharp.  Has been nearly constant.  Is eating less, no fevers or chills.  Probably worsened by food but unsure.   Please see Assessment and Plan below for the status of her chronic medical problems.  Objective:  Physical Exam: Vitals:   06/24/21 1033  BP: 127/83  Pulse: 74  Temp: 98.1 F (36.7 C)  TempSrc: Oral  SpO2: 100%   There is no height or weight on file to calculate BMI. Physical Exam Vitals and nursing note reviewed.  Constitutional:      Appearance: She is well-developed.  HENT:     Head: Normocephalic and atraumatic.  Abdominal:     Tenderness: There is abdominal tenderness in the right lower quadrant and suprapubic area. There is no guarding or rebound. Negative signs include Murphy's sign.  Skin:    General: Skin is warm and dry.  Neurological:     Mental Status: She is alert.   Assessment & Plan:  See Encounters Tab for problem based charting.  Medications Ordered No orders of the defined types were placed in this encounter.  Other Orders Orders Placed This Encounter  Procedures   CT Abdomen Pelvis W Contrast    Standing Status:   Future    Standing Expiration Date:   06/24/2022    Order Specific Question:   If indicated for the ordered procedure, I authorize the administration of contrast media per Radiology protocol    Answer:   Yes    Order Specific Question:   Is patient pregnant?    Answer:   No    Order Specific Question:   Preferred imaging location?    Answer:   [facility]    Order Specific Question:   Is Oral Contrast requested  for this exam?    Answer:   Yes, Per Radiology protocol    Order Specific Question:   Call Results- Best Contact Number?    Answer:   [phone]   CBC with Diff   CMP w Anion Gap (STAT/Sunquest-performed on-site)   Lipase   POCT Urine Pregnancy   Follow Up: No follow-ups on file.

## 2021-06-25 ENCOUNTER — Other Ambulatory Visit (HOSPITAL_COMMUNITY): Payer: Self-pay

## 2021-06-25 LAB — URINALYSIS, ROUTINE W REFLEX MICROSCOPIC
Bilirubin, UA: NEGATIVE
Glucose, UA: NEGATIVE
Ketones, UA: NEGATIVE
Leukocytes,UA: NEGATIVE
Nitrite, UA: NEGATIVE
Protein,UA: NEGATIVE
RBC, UA: NEGATIVE
Specific Gravity, UA: 1.013 (ref 1.005–1.030)
Urobilinogen, Ur: 0.2 mg/dL (ref 0.2–1.0)
pH, UA: 6 (ref 5.0–7.5)

## 2021-06-27 ENCOUNTER — Encounter (HOSPITAL_COMMUNITY): Payer: Self-pay

## 2021-06-27 ENCOUNTER — Other Ambulatory Visit: Payer: Self-pay

## 2021-06-27 ENCOUNTER — Ambulatory Visit (HOSPITAL_COMMUNITY)
Admission: RE | Admit: 2021-06-27 | Discharge: 2021-06-27 | Disposition: A | Discharge status: Home / Self Care | Payer: 59 | Source: Ambulatory Visit | Attending: Internal Medicine | Admitting: Internal Medicine

## 2021-06-27 DIAGNOSIS — R1031 Right lower quadrant pain: Secondary | ICD-10-CM | POA: Diagnosis not present

## 2021-06-27 DIAGNOSIS — R109 Unspecified abdominal pain: Secondary | ICD-10-CM | POA: Diagnosis not present

## 2021-06-27 IMAGING — CT CT ABD-PELV W/ CM
2 of 4 series · 15 of 46 positions shown, 17 images · IV contrast (OMNIPAQUE)
Comparison: None.

CLINICAL DATA: Right lower quadrant abdominal pain. Concern for
appendicitis.

EXAM:
CT ABDOMEN AND PELVIS WITH CONTRAST
TECHNIQUE: Multidetector CT imaging of the abdomen and pelvis was performed
using the standard protocol following bolus administration of
intravenous contrast.
CONTRAST:  80mL OMNIPAQUE IOHEXOL 350 MG/ML SOLN

[Series 2: axial st · axial · 0.66mm/px · z∈[-411,-11]mm · 12 of 90 slices shown, 14 images]
[im 5/90  soft-tissue]
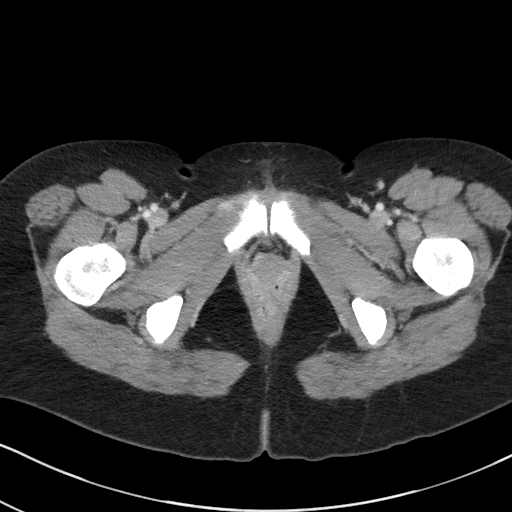
[im 5/90  bone]
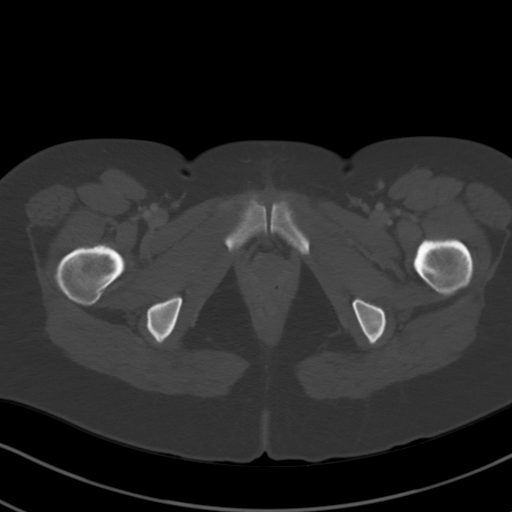
[im 15/90  soft-tissue]
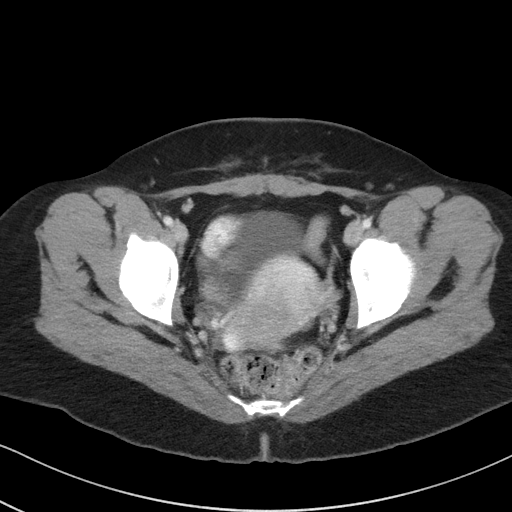
[im 20/90  soft-tissue]
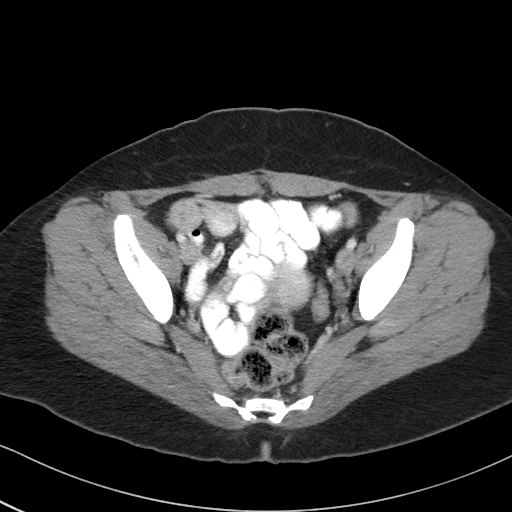
[im 25/90  soft-tissue]
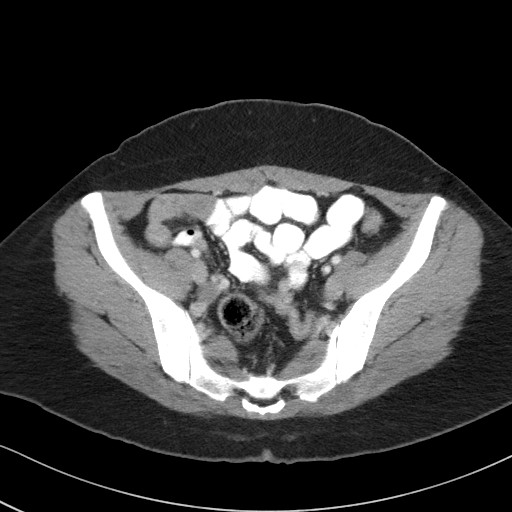
[im 35/90  soft-tissue]
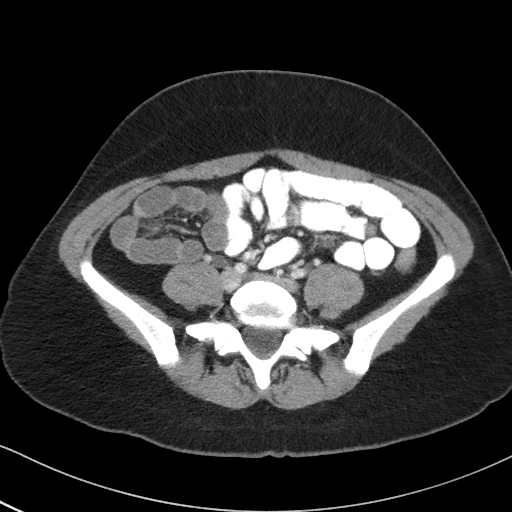
[im 40/90  soft-tissue]
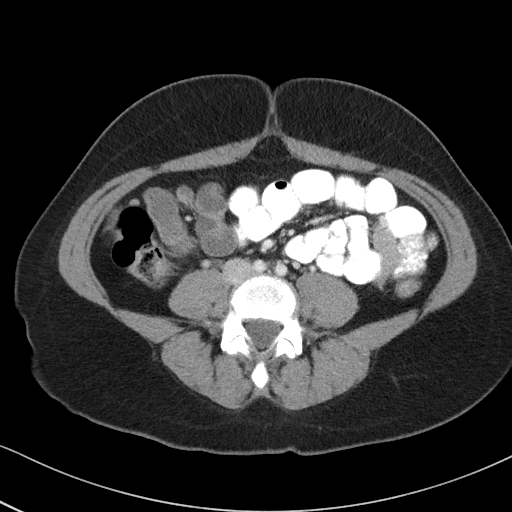
[im 50/90  soft-tissue]
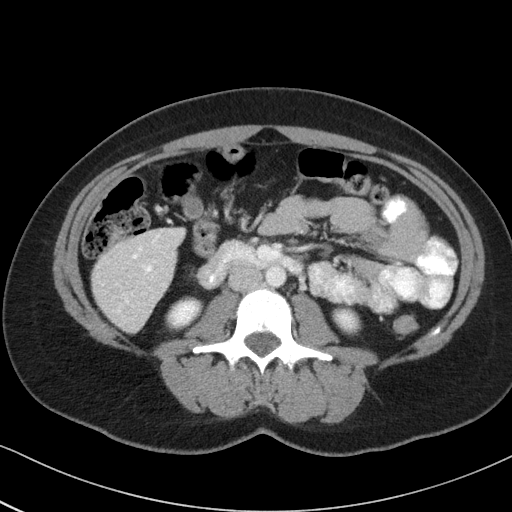
[im 55/90  soft-tissue]
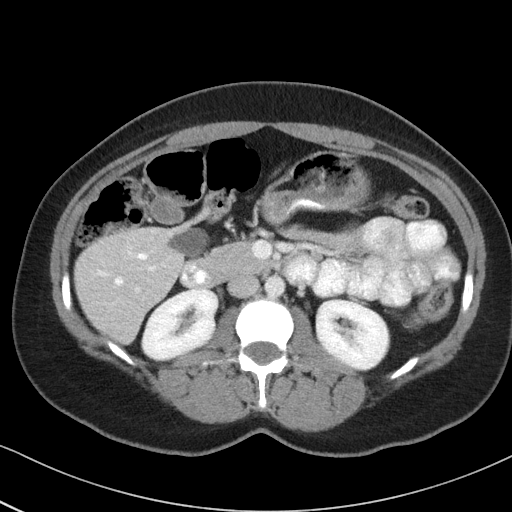
[im 65/90  soft-tissue]
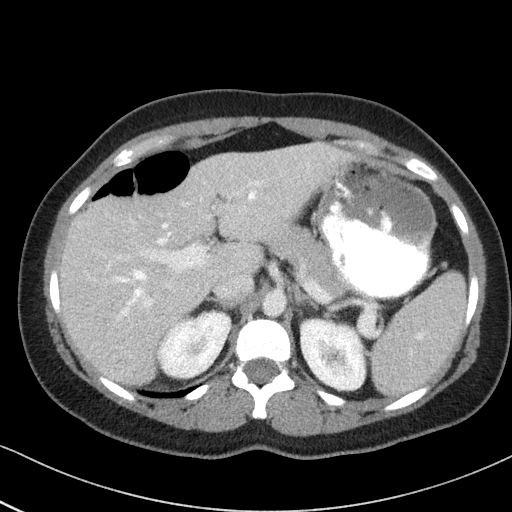
[im 65/90  bone]
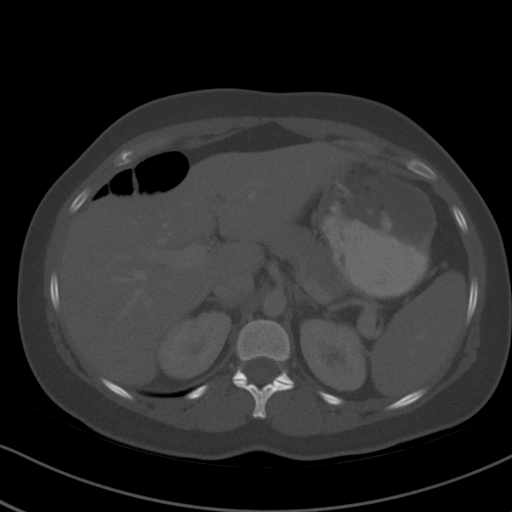
[im 70/90  soft-tissue]
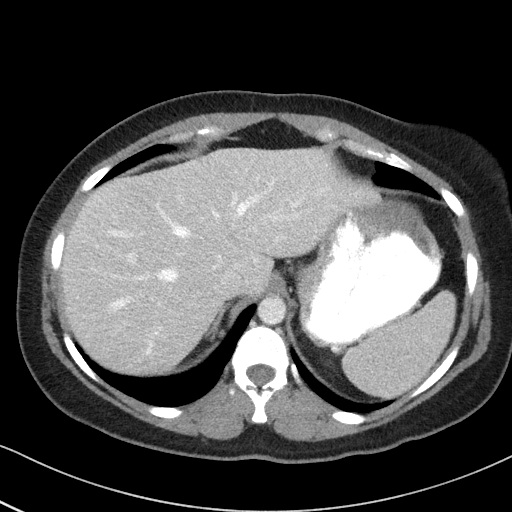
[im 75/90  soft-tissue]
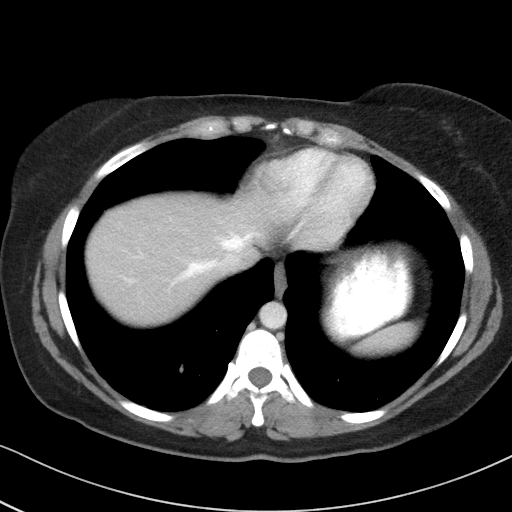
[im 85/90  soft-tissue]
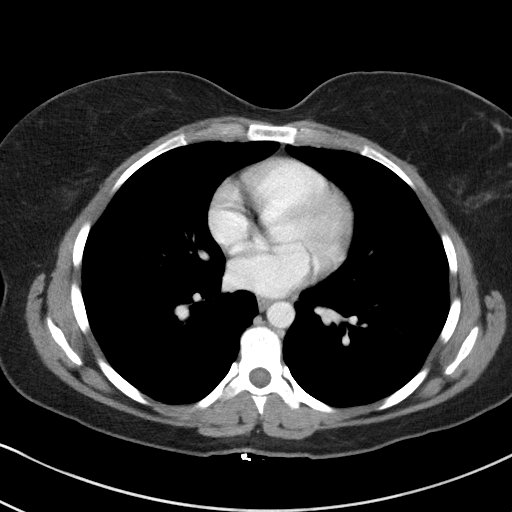

[Series 4: coronal st · coronal · 0.72mm/px · 3 of 82 slices shown]
[im 28/82  soft-tissue]
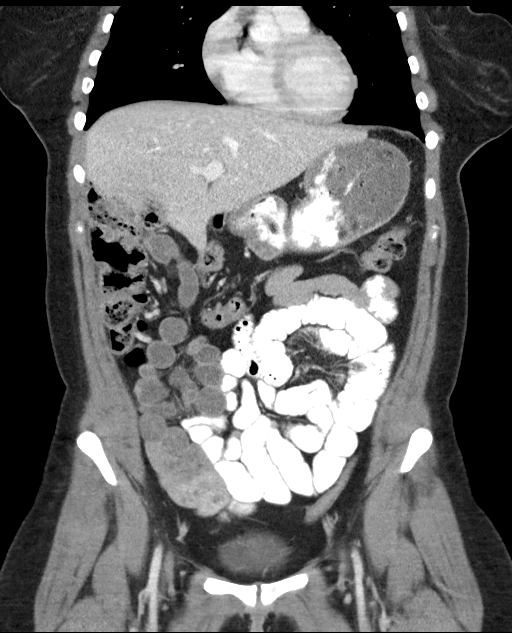
[im 37/82  soft-tissue]
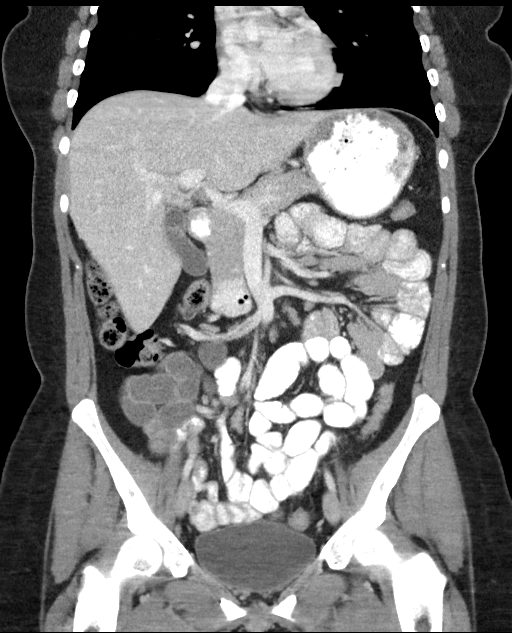
[im 46/82  soft-tissue]
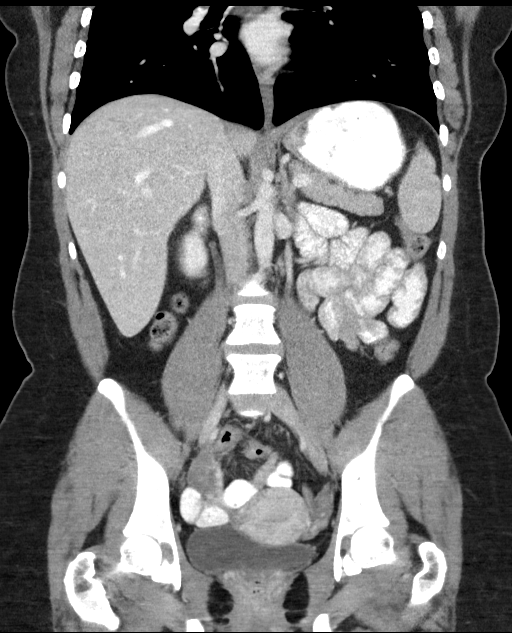

[15 of 46 positions shown; findings below may reference images not displayed]

FINDINGS: Lower chest: Limited visualization of the lower thorax demonstrates
minimal dependent subpleural ground-glass atelectasis. No discrete
focal airspace opacities.

Normal heart size.  No pericardial effusion.

Hepatobiliary: Normal hepatic contour. There is mild diffuse
decreased attenuation hepatic parenchyma on this postcontrast
examination suggestive hepatic steatosis. No discrete hepatic
lesions. Normal appearance of the gallbladder given degree of
distention. No radiopaque gallstones. No intra or extrahepatic
biliary duct dilatation. No ascites.

Pancreas: Normal appearance of pancreas.

Spleen: Normal appearance of the spleen.

Adrenals/Urinary Tract: There is symmetric enhancement of the
bilateral kidneys. No evidence of nephrolithiasis on this
postcontrast examination. No discrete renal lesions. No urinary
obstruction or perinephric stranding.

Normal appearance of the bilateral adrenal glands.

Normal appearance of the urinary bladder given degree of distention.

Stomach/Bowel: Moderate colonic stool burden without evidence of
enteric obstruction. Ingested enteric contrast extends to the level
of the mid small bowel. The cecum is noted to be located within the
right upper abdominal quadrant, interposed anterior to the right
lobe of the liver, without evidence of volvulus, a relatively common
anatomic variant of uncertain though doubtful clinical concern.
Normal appearance of the terminal ileum and the appendix (appendix
best seen on coronal images 24 through 27, series 4). No discrete
areas of bowel wall thickening. No significant hiatal hernia. No
pneumoperitoneum, pneumatosis or portal venous gas.

Vascular/Lymphatic: Normal caliber the abdominal aorta. The major
branch vessels of the abdominal aorta appear widely patent on this
non CTA examination.

No bulky retroperitoneal, mesenteric, pelvic or inguinal
lymphadenopathy.

Reproductive: Normal appearance of the pelvic organs. Note is made
of an approximately 1.5 cm right-sided presumed physiologic adnexal
cyst (image 69, series 2). No discrete left-sided adnexal lesions.
No free fluid within the pelvic cul-de-sac.

Other: Minimal stranding about the undersurface of the abdominal
pannus, likely at the location of previous Caesarean section. Tiny
mesenteric fat containing periumbilical hernia.

Musculoskeletal: No acute or aggressive osseous abnormalities.
IMPRESSION: 1. Note made of approximately 1.5 cm right-sided presumably
physiologic adnexal cyst. Otherwise, no explanation for patient's
right lower quadrant abdominal pain. Specifically, no evidence of
enteric or urinary obstruction. Normal appearance of the terminal
ileum.
2. Suspected hepatic steatosis.  Correlation with LFTs is advised.

## 2021-06-27 MED ORDER — IOHEXOL 350 MG/ML SOLN
80.0000 mL | Freq: Once | INTRAVENOUS | Status: AC | PRN
  Administered 2021-06-27: 80 mL via INTRAVENOUS

## 2021-06-28 ENCOUNTER — Other Ambulatory Visit (HOSPITAL_COMMUNITY): Payer: Self-pay

## 2021-06-30 ENCOUNTER — Other Ambulatory Visit (HOSPITAL_COMMUNITY): Payer: Self-pay

## 2021-07-01 ENCOUNTER — Other Ambulatory Visit (HOSPITAL_COMMUNITY): Payer: Self-pay

## 2021-07-06 ENCOUNTER — Other Ambulatory Visit (HOSPITAL_COMMUNITY): Payer: Self-pay

## 2021-07-07 ENCOUNTER — Other Ambulatory Visit (HOSPITAL_COMMUNITY): Payer: Self-pay

## 2021-07-15 ENCOUNTER — Telehealth: Payer: Self-pay | Admitting: Psychiatry

## 2021-07-15 NOTE — Telephone Encounter (Signed)
New pt appt changed due to MD being out of office, LVM & sent mychart msg informing pt.

## 2021-07-17 ENCOUNTER — Other Ambulatory Visit (HOSPITAL_COMMUNITY): Payer: Self-pay

## 2021-07-23 ENCOUNTER — Ambulatory Visit: Payer: 59 | Admitting: Psychiatry

## 2021-07-23 ENCOUNTER — Other Ambulatory Visit (HOSPITAL_COMMUNITY): Payer: Self-pay

## 2021-07-24 ENCOUNTER — Other Ambulatory Visit (HOSPITAL_COMMUNITY): Payer: Self-pay

## 2021-07-28 ENCOUNTER — Ambulatory Visit: Payer: 59 | Admitting: Psychiatry

## 2021-08-04 ENCOUNTER — Ambulatory Visit: Payer: 59 | Admitting: Psychiatry

## 2021-08-04 ENCOUNTER — Encounter: Payer: Self-pay | Admitting: Psychiatry

## 2021-08-04 ENCOUNTER — Other Ambulatory Visit (HOSPITAL_COMMUNITY): Payer: Self-pay

## 2021-08-04 VITALS — BP 123/77 | HR 87 | Ht 62.0 in | Wt 150.2 lb

## 2021-08-04 DIAGNOSIS — G43109 Migraine with aura, not intractable, without status migrainosus: Secondary | ICD-10-CM | POA: Diagnosis not present

## 2021-08-04 DIAGNOSIS — R0683 Snoring: Secondary | ICD-10-CM

## 2021-08-04 MED ORDER — PROPRANOLOL HCL 20 MG PO TABS
20.0000 mg | ORAL_TABLET | Freq: Two times a day (BID) | ORAL | 2 refills | Status: DC
  Filled 2021-08-04: qty 60, 30d supply, fill #0

## 2021-08-04 NOTE — Patient Instructions (Addendum)
Start propranolol 20 mg twice a day for migraine prevention Continue Nurtec as needed for migraines Referral to Sleep  Natural supplements: Magnesium Oxide or Magnesium Glycinate 500 mg at bed (up to 800 mg daily) Coenzyme Q10 300 mg in AM Vitamin B2- 200 mg twice a day  Add 1 supplement at a time since even natural supplements can have undesirable side effects. You can sometimes buy supplements cheaper (especially Coenzyme Q10) at www.https://compton-perez.com/ or at [employer].  Vitamins and herbs that show potential:  Magnesium: Magnesium (250 mg twice a day or 500 mg at bed) has a relaxant effect on smooth muscles such as blood vessels. Individuals suffering from frequent or daily headache usually have low magnesium levels which can be increase with daily supplementation of 400-750 mg. Three trials found 40-90% average headache reduction  when used as a preventative. Magnesium also demonstrated the benefit in menstrually related migraine.  Magnesium is part of the messenger system in the serotonin cascade and it is a good muscle relaxant.  It is also useful for constipation which can be a side effect of other medications used to treat migraine. Good sources include nuts, whole grains, and tomatoes. Side Effects: loose stool/diarrhea Riboflavin (vitamin B 2) 200 mg twice a day. This vitamin assists nerve cells in the production of ATP a principal energy storing molecule.  It is necessary for many chemical reactions in the body.  There have been at least 3 clinical trials of riboflavin using 400 mg per day all of which suggested that migraine frequency can be decreased.  All 3 trials showed significant improvement in over half of migraine sufferers.  The supplement is found in bread, cereal, milk, meat, and poultry.  Most Americans get more riboflavin than the recommended daily allowance, however riboflavin deficiency is not necessary for the supplements to help prevent headache. Side effects: energizing, green  urine  Coenzyme Q10: This is present in almost all cells in the body and is critical component for the conversion of energy.  Recent studies have shown that a nutritional supplement of CoQ10 can reduce the frequency of migraine attacks by improving the energy production of cells as with riboflavin.  Doses of 150 mg twice a day have been shown to be effective.  Melatonin: Increasing evidence shows correlation between melatonin secretion and headache conditions.  Melatonin supplementation has decreased headache intensity and duration.  It is widely used as a sleep aid.  Sleep is natures way of dealing with migraine.  A dose of 3 mg is recommended to start for headaches including cluster headache. Higher doses up to 15 mg has been reviewed for use in Cluster headache and have been used. The rationale behind using melatonin for cluster is that many theories regarding the cause of Cluster headache center around the disruption of the normal circadian rhythm in the brain.  This helps restore the normal circadian rhythm.  Ginger: Ginger has a small amount of antihistamine and anti-inflammatory action which may help headache.  It is primarily used for nausea and may aid in the absorption of other medications. HEADACHE DIET: Foods and beverages which may trigger migraine Note that only 20% of headache patients are food sensitive. You will know if you are food sensitive if you get a headache consistently 20 minutes to 2 hours after eating a certain food. Only cut out a food if it causes headaches, otherwise you might remove foods you enjoy! What matters most for diet is to eat a well balanced healthy diet full of  vegetables and low fat protein, and to not miss meals.  Chocolate, other sweets ALL cheeses except cottage and cream cheese Dairy products, yogurt, sour cream, ice cream Liver Meat extracts (Bovril, Marmite, meat tenderizers) Meats or fish which have undergone aging, fermenting, pickling or smoking.  These include: Hotdogs,salami,Lox,sausage, mortadellas,smoked salmon, pepperoni, Pickled herring Pods of broad bean (English beans, Chinese pea pods, [place] (fava) beans, lima and navy beans Ripe avocado, ripe banana Yeast extracts or active yeast preparations such as Brewer's or Fleishman's (commercial bakes goods are permitted) Tomato based foods, pizza (lasagna, etc.)  MSG (monosodium glutamate) is disguised as many things; look for these common aliases: Monopotassium glutamate Autolysed yeast Hydrolysed protein Sodium caseinate flavorings all natural preservatives" Nutrasweet  Avoid all other foods that convincingly provoke headaches.  Resources: The Dizzy Lu Duffel Your Headache Diet, migrainestrong.com  https://www.aguirre.org/  Caffeine and Migraine For patients that have migraine, caffeine intake more than 3 days per week can lead to dependency and increased migraine frequency. I would recommend cutting back on your caffeine intake as best you can. The recommended amount of caffeine is 200-300 mg daily, although migraine patients may experience dependency at even lower doses. While you may notice an increase in headache temporarily, cutting back will be helpful for headaches in the long run. For more information on caffeine and migraine, visit: https://americanmigrainefoundation.org/resource-library/caffeine-and-migraine/  Headache Prevention Strategies:  1. Maintain a headache diary; learn to identify and avoid triggers.  - This can be a simple note where you log when you had a headache, associated symptoms, and medications used - There are several smartphone apps developed to help track migraines: Migraine Buddy, Migraine Monitor, Curelator N1-Headache App  Common triggers include: Emotional triggers: Emotional/Upset family or friends Emotional/Upset occupation Business reversal/success Anticipation  anxiety Crisis-serious Post-crisis periodNew job/position   Physical triggers: Vacation Day Weekend Strenuous Exercise High Altitude Location New Move Menstrual Day Physical Illness Oversleep/Not enough sleep Weather changes Light: Photophobia or light sesnitivity treatment involves a balance between desensitization and reduction in overly strong input. Use dark polarized glasses outside, but not inside. Avoid bright or fluorescent light, but do not dim environment to the point that going into a normally lit room hurts. Consider FL-41 tint lenses, which reduce the most irritating wavelengths without blocking too much light.  These can be obtained at axonoptics.com or theraspecs.com Foods: see list above.  2. Limit use of acute treatments (over-the-counter medications, triptans, etc.) to no more than 2 days per week or 10 days per month to prevent medication overuse headache (rebound headache).    3. Follow a regular schedule (including weekends and holidays): Don't skip meals. Eat a balanced diet. 8 hours of sleep nightly. Minimize stress. Exercise 30 minutes per day. Being overweight is associated with a 5 times increased risk of chronic migraine. Keep well hydrated and drink 6-8 glasses of water per day.  4. Initiate non-pharmacologic measures at the earliest onset of your headache. Rest and quiet environment. Relax and reduce stress. Breathe2Relax is a free app that can instruct you on    some simple relaxtion and breathing techniques. Http://Dawnbuse.com is a    free website that provides teaching videos on relaxation.  Also, there are  many apps that   can be downloaded for mindful relaxation.  An app called YOGA NIDRA will help walk you through mindfulness. Another app called Calm can be downloaded to give you a structured mindfulness guide with daily reminders and skill development. Headspace for guided meditation Mindfulness Based Stress Reduction Online  Course:  www.palousemindfulness.com Cold compresses.  5. Don't wait!! Take the maximum allowable dosage of prescribed medication at the first sign of migraine.  6. Compliance:  Take prescribed medication regularly as directed and at the first sign of a migraine.  7. Communicate:  Call your physician when problems arise, especially if your headaches change, increase in frequency/severity, or become associated with neurological symptoms (weakness, numbness, slurred speech, etc.).  8. Headache/pain management therapies: Consider various complementary methods, including medication, behavioral therapy, psychological counselling, biofeedback, massage therapy, acupuncture, dry needling, and other modalities.  Such measures may reduce the need for medications. Counseling for pain management, where patients learn to function and ignore/minimize their pain, seems to work very well.  9. Recommend changing family's attention and focus away from patient's headaches. Instead, emphasize daily activities. If first question of day is 'How are your headaches/Do you have a headache today?', then patient will constantly think about headaches, thus making them worse. Goal is to re-direct attention away from headaches, toward daily activities and other distractions.  10. Helpful Websites: www.AmericanHeadacheSociety.org VoipObserver.it www.headaches.org GolfingFamily.no www.achenet.org

## 2021-08-04 NOTE — Progress Notes (Signed)
Referring:  Lucious Groves, DO [address]  [place],  [place] [address]  PCP: Lucious Groves, DO  Neurology was asked to evaluate Catherine Chang, a 30 year old female for a chief complaint of headaches.  Our recommendations of care will be communicated by shared medical record.    CC:  headaches  HPI:  Medical co-morbidities: hypothyroidism, ADHD, depression  The patient presents for evaluation of migraines which began in medical school. They increased in frequency after her second pregnancy. Migraines vary in frequency but typically has two weeks per month where she has a migraine every day. 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 Feels stress is a big trigger for her headaches.  Migraines are described as left retro-orbital throbbing with associated photophobia. She has had a scotoma associated with her migraines 3 times in her life. Migraines with visual aura are typically more severe than her migraines without aura.  Headaches respond well to Nurtec and NSAIDs.  Headache History: Onset: medical schol Triggers: stress, lack of sleep Aura: visual scotoma (only had 3 times) Location: left retro-orbital Quality/Description: throbbing Associated Symptoms:  Photophobia: yes  Phonophobia: no  Nausea: no Other symptoms: brain fog Worse with activity?: yes Duration of headaches: hours   Headache days per month: 14 Headache free days per month: 16  Current Treatment: Abortive Nurtec  Preventative none  Prior Therapies                                 Nurtec 75 mg PRN  Headache Risk Factors: Headache risk factors and/or co-morbidities (-) Neck Pain (-) History of Motor Vehicle Accident (-) Obesity  Body mass index is 27.47 kg/m. (-) History of Traumatic Brain Injury and/or Concussion  LABS: CBC    Component Value Date/Time   WBC 8.4 06/24/2021 1048   RBC 4.37 06/24/2021 1048   HGB 13.4 06/24/2021  1048   HGB 13.9 12/27/2020 1200   HCT 40.2 06/24/2021 1048   HCT 42.0 12/27/2020 1200   PLT 363 06/24/2021 1048   PLT 156 12/27/2020 1200   MCV 92.0 06/24/2021 1048   MCV 91 12/27/2020 1200   MCH 30.7 06/24/2021 1048   MCHC 33.3 06/24/2021 1048   RDW 12.2 06/24/2021 1048   RDW 11.9 12/27/2020 1200   LYMPHSABS 2.7 06/24/2021 1048   LYMPHSABS 2.6 12/27/2020 1200   MONOABS 0.6 06/24/2021 1048   EOSABS 0.0 06/24/2021 1048   EOSABS 0.1 12/27/2020 1200   BASOSABS 0.0 06/24/2021 1048   BASOSABS 0.0 12/27/2020 1200   CMP Latest Ref Rng & Units 06/24/2021 12/27/2020 10/03/2020  Glucose 70 - 99 mg/dL 97 82 88  BUN 6 - 20 mg/dL 12 13 11   Creatinine 0.44 - 1.00 mg/dL 0.84 0.69 0.77  Sodium 135 - 145 mmol/L 136 138 136  Potassium 3.5 - 5.1 mmol/L 4.4 4.4 4.5  Chloride 98 - 111 mmol/L 102 100 100  CO2 22 - 32 mmol/L 24 20 18(L)  Calcium 8.9 - 10.3 mg/dL 9.4 9.8 10.0  Total Protein 6.5 - 8.1 g/dL 6.9 7.1 6.9  Total Bilirubin 0.3 - 1.2 mg/dL 0.6 <0.2 <0.2  Alkaline Phos 38 - 126 U/L 73 116 101  AST 15 - 41 U/L 19 17 16   ALT 0 - 44 U/L 25 26 17       IMAGING:  none   Current Outpatient Medications on File Prior to  Visit  Medication Sig Dispense Refill   amphetamine-dextroamphetamine (ADDERALL) 10 MG tablet TAKE 1 TABLET BY MOUTH TWICE DAILY. 60 tablet 0   buPROPion (WELLBUTRIN SR) 150 MG 12 hr tablet Take 1 tablet (150 mg total) by mouth daily. 90 tablet 3   fluticasone (FLONASE) 50 MCG/ACT nasal spray Place 1 spray into both nostrils daily as needed for allergies or rhinitis.     levothyroxine (SYNTHROID) 75 MCG tablet TAKE 1 TABLET BY MOUTH ONCE A DAY BEFORE BREAKFAST 90 tablet 3   Rimegepant Sulfate (NURTEC) 75 MG TBDP Dissolve 1 tablet by mouth daily as needed for migraine. 16 tablet 5   Semaglutide, 1 MG/DOSE, (OZEMPIC, 1 MG/DOSE,) 4 MG/3ML SOPN Inject 1 mg into the skin once a week. 3 mL 2   traZODone (DESYREL) 50 MG tablet TAKE 1 TABLET BY MOUTH AT BEDTIME AS NEEDED FOR SLEEP 90  tablet 2   No current facility-administered medications on file prior to visit.     Allergies: No Known Allergies  Family History: Migraine or other headaches in the family:  father Aneurysms in a first degree relative:  no Brain tumors in the family:  no Other neurological illness in the family:   no  Past Medical History: Past Medical History:  Diagnosis Date   ADHD    Amniotic fluid index decreased 04/03/2020   Depression    Gestational hypertension 04/11/2020   Impaired glucose tolerance in pregnancy 09/04/2017   Oligohydramnios antepartum 04/03/2020   Status post repeat low transverse cesarean section 04/10/2020    Past Surgical History Past Surgical History:  Procedure Laterality Date   BREAST REDUCTION SURGERY Bilateral 2006   BUNIONECTOMY Right 2005   [place] N/A 03/23/2018   Procedure: PRIMARY CESAREAN SECTION;  Surgeon: Everett Graff, MD;  Location: [facility];  Service: Obstetrics;  Laterality: N/A;  RNFA Requested   CESAREAN SECTION N/A 04/10/2020   Procedure: CESAREAN SECTION;  Surgeon: Sanjuana Kava, MD;  Location: [facility];  Service: Obstetrics;  Laterality: N/A;   TUBAL LIGATION Bilateral 04/10/2020   Procedure: BILATERAL TUBAL LIGATION;  Surgeon: Sanjuana Kava, MD;  Location: [facility];  Service: Obstetrics;  Laterality: Bilateral;    Social History: Social History   Tobacco Use   Smoking status: Never   Smokeless tobacco: Never  Vaping Use   Vaping Use: Never used  Substance Use Topics   Alcohol use: Yes    Comment: Occasionallly.   Drug use: Never    ROS: Negative for fevers, chills. Positive for migraines. All other systems reviewed and negative unless stated otherwise in HPI.   Physical Exam:   Vital Signs: BP 123/77    Pulse 87    Ht 5' 2"  (1.575 m)    Wt 150 lb 3.2 oz (68.1 kg)    BMI 27.47 kg/m  GENERAL: well appearing,in no acute distress,alert SKIN:  Color, texture, turgor normal. No rashes or  lesions HEAD:  Normocephalic/atraumatic. CV:  RRR RESP: Normal respiratory effort MSK: no tenderness to palpation over occiput, neck, or shoulders  NEUROLOGICAL: Mental Status: Alert, oriented to person, place and time,Follows commands Cranial Nerves: PERRL,visual fields intact to confrontation,extraocular movements intact,facial sensation intact,no facial droop or ptosis,hearing intact  bilaterally, no dysarthria Motor: muscle strength 5/5 both upper and lower extremities,no drift, normal tone Reflexes: 2+ throughout Sensation: intact to light touch all 4 extremities Coordination: Finger-to- nose-finger intact bilaterally Gait: normal-based   IMPRESSION: 30 year old female with a history of hypothyroidism, ADHD,  depression who presents for evaluation of migraines. She currently has ~14 headache days per month which is on the border of high frequency episodic and chronic migraine. Discussed preventive medication options, would prefer to avoid Topamax if possible as she has an intellectually demanding job and is concerned about possible brain fog. Will start propranolol for migraine prevention. She notes a strong family history of sleep apnea and she does snore at night. Will place a referral to Sleep for evaluation of sleep apnea.  PLAN: -Preventive: Start propranolol 20 mg BID -Rescue: Continue Nurtec 75 mg PRN -Referral to Sleep for OSA evaluation -Next steps: consider topamax/zonisamide, CGRP, preventive Nurtec or Qulipta  I spent a total of 25 minutes chart reviewing and counseling the patient. Headache education was done. Discussed treatment options including preventive and acute medications, and natural supplements. Discussed medication side effects, adverse reactions and drug interactions. Written educational materials and patient instructions outlining all of the above were given.  Follow-up: 3 months   Genia Harold, MD 08/04/2021   9:35 AM

## 2021-08-15 ENCOUNTER — Other Ambulatory Visit: Payer: Self-pay | Admitting: Internal Medicine

## 2021-08-15 ENCOUNTER — Other Ambulatory Visit (HOSPITAL_COMMUNITY): Payer: Self-pay

## 2021-08-18 ENCOUNTER — Other Ambulatory Visit (HOSPITAL_COMMUNITY): Payer: Self-pay

## 2021-08-18 ENCOUNTER — Other Ambulatory Visit: Payer: Self-pay | Admitting: Internal Medicine

## 2021-08-19 ENCOUNTER — Other Ambulatory Visit: Payer: Self-pay | Admitting: Internal Medicine

## 2021-08-19 ENCOUNTER — Other Ambulatory Visit (HOSPITAL_COMMUNITY): Payer: Self-pay

## 2021-08-19 MED ORDER — AMPHETAMINE-DEXTROAMPHETAMINE 10 MG PO TABS
ORAL_TABLET | Freq: Two times a day (BID) | ORAL | 0 refills | Status: DC
  Filled 2021-08-19: qty 60, 30d supply, fill #0

## 2021-08-19 NOTE — Progress Notes (Signed)
Refilled adderall rx

## 2021-08-21 ENCOUNTER — Ambulatory Visit: Payer: 59 | Admitting: Neurology

## 2021-08-21 ENCOUNTER — Other Ambulatory Visit: Payer: Self-pay

## 2021-08-21 ENCOUNTER — Encounter: Payer: Self-pay | Admitting: Neurology

## 2021-08-21 VITALS — BP 143/88 | HR 70 | Ht 62.0 in | Wt 146.5 lb

## 2021-08-21 DIAGNOSIS — Z820 Family history of epilepsy and other diseases of the nervous system: Secondary | ICD-10-CM | POA: Diagnosis not present

## 2021-08-21 DIAGNOSIS — R0683 Snoring: Secondary | ICD-10-CM | POA: Diagnosis not present

## 2021-08-21 DIAGNOSIS — G43109 Migraine with aura, not intractable, without status migrainosus: Secondary | ICD-10-CM | POA: Diagnosis not present

## 2021-08-21 NOTE — Addendum Note (Signed)
Addended by: Larey Seat on: 08/21/2021 04:20 PM   Modules accepted: Orders

## 2021-08-21 NOTE — Patient Instructions (Signed)
Screening for Sleep Apnea Sleep apnea is a condition in which breathing pauses or becomes shallow during sleep. Sleep apnea screening is a test to determine if you are at risk for sleep apnea. The test includes a series of questions. It will only takes a few minutes. Your health care provider may ask you to have this test in preparation for surgery or as part of a physical exam. What are the symptoms of sleep apnea? Common symptoms of sleep apnea include: Snoring. Waking up often at night. Daytime sleepiness. Pauses in breathing. Choking or gasping during sleep. Irritability. Forgetfulness. Trouble thinking clearly. Depression. Personality changes. Most people with sleep apnea do not know that they have it. What are the advantages of sleep apnea screening? Getting screened for sleep apnea can help: Ensure your safety. It is important for your health care providers to know whether or not you have sleep apnea, especially if you are having surgery or have other long-term (chronic) health conditions. Improve your health and allow you to get a better night's rest. Restful sleep can help you: Have more energy. Lose weight. Improve high blood pressure. Improve diabetes management. Prevent stroke. Prevent car accidents. What happens during the screening? Screening usually includes being asked a list of questions about your sleep quality. Some questions you may be asked include: Do you snore? Is your sleep restless? Do you have daytime sleepiness? Has a partner or spouse told you that you stop breathing during sleep? Have you had trouble concentrating or memory loss? What is your age? What is your neck circumference? To measure your neck, keep your back straight and gently wrap the tape measure around your neck. Put the tape measure at the middle of your neck, between your chin and collarbone. What is your sex assigned at birth? Do you have or are you being treated for high blood  pressure? If your screening test is positive, you are at risk for the condition. Further testing may be needed to confirm a diagnosis of sleep apnea. Where to find more information You can find screening tools online or at your health care clinic. For more information about sleep apnea screening and healthy sleep, visit these websites: Centers for Disease Control and Prevention: www.cdc.gov American Sleep Apnea Association: www.sleepapnea.org Contact a health care provider if: You think that you may have sleep apnea. Summary Sleep apnea screening can help determine if you are at risk for sleep apnea. It is important for your health care providers to know whether or not you have sleep apnea, especially if you are having surgery or have other chronic health conditions. You may be asked to take a screening test for sleep apnea in preparation for surgery or as part of a physical exam. This information is not intended to replace advice given to you by your health care provider. Make sure you discuss any questions you have with your health care provider. Document Revised: 07/12/2020 Document Reviewed: 07/12/2020 Elsevier Patient Education  2022 [employer].  

## 2021-08-21 NOTE — Progress Notes (Signed)
SLEEP MEDICINE CLINIC    Provider:  Larey Seat, MD  Primary Care Physician:  Lucious Groves, DO [address] [place] [address]     Referring Provider: Genia Harold, [place], [place],  [place] [address]          Chief Complaint according to patient   Patient presents with:     New Patient (Initial Visit)           HISTORY OF PRESENT ILLNESS:  Catherine Chang is a 31 y.o. year old White female patient seen here as a referral on 08/21/2021 .  Chief concern according to patient : Dr Philipp Ovens was referred  for snoring and possible sleep apnea. No pior SS. Pt c/o of HA, insomnia and snoring. Pt reports worsening in migraines last 2 years. Father was diagnosed in his 17s. Migraine disappeared when apnea was treated, younger brother has OSA.  Paternal GF snored and died in his sleep at age 64.  Patient was diagnosed with ADHD in school age, migraine onset in medical school,  have been worsening since second pregnancy, 18 months ago. First baby had cleft lip,palate, she developed hypothyroidism.    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   Sleep relevant medical history: Snoring since childhood, sleeps prone for that reason. Wisdom teeth removed. No Tonsillectomy, sinusitis, year around.    Family medical /sleep history: many  other family member on CPAP with OSA. Social history:  Patient is working as a Engineer, drilling  and lives in a household with spouse and 2 young children and 2 dogs.  The patient currently works in shifts( Presenter, broadcasting,) and takes call as an attending.   Tobacco use-none .  ETOH use ; 2 /month,  Caffeine  intake in form of Coffee( 1 cup in AM ).  Sleep habits are as follows: The patient's dinner time is between 6-6.30 PM. The patient goes to bed at 9.30 PM and continues to sleep for intervals of 2-3  hours, wakes for 0-1 bathroom breaks.   The preferred sleep position is prone , with the support of 1 pillows.  Dreams are reportedly frequent/vivid, more on trazodone  8.30 is work start - 5.30-6.30 AM is the usual rise time. The patient wakes up by alarm . She reports not feeling refreshed or restored for long in AM, with symptoms such as dry mouth, morning headaches,  and residual fatigue. Naps are taken in vacation or weekends, lasting from 60 to 90 minutes and are less refreshing than nocturnal sleep.    Review of Systems: Out of a complete 14 system review, the patient complains of only the following symptoms, and all other reviewed systems are negative.:  Fatigue, sleepiness , snoring, fragmented sleep, Insomnia.    How likely are you to doze in the following situations: 0 = not likely, 1 = slight chance, 2 = moderate chance, 3 = high chance   Sitting and Reading? Watching Television? Sitting inactive in a public place (theater or meeting)? As a passenger in a car for an hour without a break? Lying down in the afternoon when circumstances permit? Sitting and talking to someone? Sitting quietly after lunch without alcohol? In a car, while stopped  for a few minutes in traffic?   Total = 8/ 24 points - on adderall 10 bid  and trazodone at night.   FSS endorsed at x/ 63 points.   Headaches are characterized by aura, photophobia and most common onset time in PM.  Some headaches are related to interrupted.   Sleep paralysis, in college, not recently.   Social History   Socioeconomic History   Marital status: Married    Spouse name: Catherine Chang   Number of children: 2   Years of education: MD , Doctorate    Highest education level:   Occupational History   Physician   Tobacco Use    Smoking status: Never   Smokeless tobacco: Never  Vaping Use   Vaping Use: Never used  Substance and Sexual Activity   Alcohol use: Yes    Comment: Occasionallly.   Drug use: Never   Sexual activity: Yes  Other Topics Concern   Not on file  Social History Narrative   Lives at home with husband and 2 children   Right handed caffeine: 10oz cup of coffee in the morning. [name] 2nd   Social Determinants of Health   Financial Resource Strain: Not on file  Food Insecurity: Not on file  Transportation Needs: Not on file  Physical Activity: Not on file  Stress: Not on file  Social Connections: Not on file    Family History  Problem Relation Age of Onset   Hypothyroidism Mother    Obstructive Sleep Apnea Father    Bipolar disorder Brother    Obstructive Sleep Apnea Brother    Hypothyroidism Maternal Aunt    Hypothyroidism Maternal Grandmother    Heart disease Maternal Grandfather    Obstructive Sleep Apnea Paternal Grandmother    Heart disease Paternal Grandfather        sudden cardiac arrest   Obstructive Sleep Apnea Paternal Teacher, music Daughter     Past Medical History:  Diagnosis Date   ADHD    Amniotic fluid index decreased 04/03/2020   Depression    Gestational hypertension 04/11/2020   Impaired glucose tolerance in pregnancy 09/04/2017   Oligohydramnios antepartum 04/03/2020   Status post repeat low transverse cesarean section 04/10/2020    Past Surgical History:  Procedure Laterality Date   BREAST REDUCTION SURGERY Bilateral 2006   BUNIONECTOMY Right 2005   [place] N/A 03/23/2018   Procedure: PRIMARY CESAREAN SECTION;  Surgeon: Everett Graff, MD;  Location: [facility];  Service: Obstetrics;  Laterality: N/A;  RNFA Requested   CESAREAN SECTION N/A 04/10/2020   Procedure: CESAREAN SECTION;  Surgeon: Sanjuana Kava, MD;  Location: [facility];  Service: Obstetrics;  Laterality: N/A;   TUBAL LIGATION Bilateral 04/10/2020    Procedure: BILATERAL TUBAL LIGATION;  Surgeon: Sanjuana Kava, MD;  Location: [facility];  Service: Obstetrics;  Laterality: Bilateral;     Current Outpatient Medications on File Prior to Visit  Medication Sig Dispense Refill   amphetamine-dextroamphetamine (ADDERALL) 10 MG tablet TAKE 1 TABLET BY MOUTH TWICE DAILY. 60 tablet 0   buPROPion (WELLBUTRIN SR) 150 MG 12 hr tablet Take 1 tablet (150 mg total) by mouth daily. 90 tablet 3   fluticasone (FLONASE) 50 MCG/ACT nasal spray Place 1 spray into both nostrils daily as needed for allergies or rhinitis.     levothyroxine (SYNTHROID) 75 MCG tablet TAKE 1 TABLET BY MOUTH ONCE A DAY BEFORE BREAKFAST 90 tablet 3   Rimegepant Sulfate (NURTEC) 75 MG  TBDP Dissolve 1 tablet by mouth daily as needed for migraine. 16 tablet 5   Semaglutide, 1 MG/DOSE, (OZEMPIC, 1 MG/DOSE,) 4 MG/3ML SOPN Inject 1 mg into the skin once a week. (Patient taking differently: Inject 0.25 mg into the skin once a week.) 3 mL 2   traZODone (DESYREL) 50 MG tablet TAKE 1 TABLET BY MOUTH AT BEDTIME AS NEEDED FOR SLEEP 90 tablet 2   No current facility-administered medications on file prior to visit.    No Known Allergies  Physical exam:  Today's Vitals   08/21/21 1508  BP: (!) 143/88  Pulse: 70  Weight: 146 lb 8 oz (66.5 kg)  Height: 5' 2"  (1.575 m)   Body mass index is 26.8 kg/m.   Wt Readings from Last 3 Encounters:  08/21/21 146 lb 8 oz (66.5 kg)  08/04/21 150 lb 3.2 oz (68.1 kg)  06/19/21 153 lb 11.2 oz (69.7 kg)     Ht Readings from Last 3 Encounters:  08/21/21 5' 2"  (1.575 m)  08/04/21 5' 2"  (1.575 m)  06/19/21 5' 2"  (1.575 m)      General: The patient is awake, alert and appears not in acute distress. The patient is well groomed. Head: Normocephalic, atraumatic. Neck is supple. Mallampati 2-3,  neck circumference:14 inches . Nasal airflow patent.  Retrognathia is not  seen.  Dental status : intact Cardiovascular:  Regular rate and cardiac rhythm by pulse,   without distended neck veins. Respiratory: Lungs are clear to auscultation.  Skin:  Without evidence of ankle edema, or rash. Trunk: The patient's posture is erect.   Neurologic exam : The patient is awake and alert, oriented to place and time.   Memory subjective described as intact.  Attention span & concentration ability appears normal.  Speech is fluent,  without  dysarthria, dysphonia or aphasia.  Mood and affect are appropriate.   Cranial nerves: no loss of smell or taste reported  Pupils are equal and briskly reactive to light. Funduscopic exam deferred. .  Extraocular movements in vertical and horizontal planes were intact and without nystagmus. No Diplopia. Visual fields by finger perimetry are intact. Hearing was intact to soft voice and finger rubbing.    Facial sensation intact to fine touch.  Facial motor strength is symmetric and tongue and uvula move midline.  Neck ROM : rotation, tilt and flexion extension were normal for age and shoulder shrug was symmetrical.    Motor exam:  Symmetric bulk, tone and ROM.   Normal tone without cog wheeling, symmetric grip strength .   Sensory:  Fine touch, pinprick and vibration were tested  and  normal.  Proprioception tested in the upper extremities was normal.   Coordination: Rapid alternating movements in the fingers/hands were of normal speed.  The Finger-to-nose maneuver was intact without evidence of ataxia, dysmetria or tremor.   Gait and station: Patient could rise unassisted from a seated position, walked without assistive device.  Stance is of normal width/ base.  Toe and heel walk were deferred.  Deep tendon reflexes: in the  upper and lower extremities are symmetric and intact.  Babinski response was deferred.       After spending a total time of  45  minutes face to face and additional time for physical and neurologic examination, review of laboratory studies,  personal review of imaging studies, reports and  results of other testing and review of referral information / records as far as provided in visit, I have established the following assessments:  1) by description Dr. Rivka Safer history is very suggestive of having both snoring and upper airway resistance sleep which well may have a link to obstructive sleep apnea.  She has a strong family history in family members with normal body mass index.    There is also a correlation of worsening migraines over the last 2 years may be related to stress, her duties in the hospital etc. but there is a question if this has worsened also after her last pregnancy.   Hormonal changes certainly can influence sleep architecture.  One of my concerns is that young mothers usually have less deep sleep and are more prone to listen to the babies at night. 2) sleep  interrupted by the children.   3) ADHD medication may mask hypersomnia.   4) poor sleep is more often followed by a Migrainous headache.    My Plan is to proceed with:  1) I will be happy to order a sleep study to screen for the presence of sleep apnea given the strong family history.  This will also answer questions about possible hypoxia at night, unusual fast or slow heart rates and will give some additional information about position and associated snoring by sleep position.  I would like to thank [place] Karleen Hampshire and Genia Harold, [place], [place],  [place] [address] for allowing me to meet with and to take care of this pleasant patient.   In short, MELLISSA CONLEY resents for sleep apnea screening.   I plan to follow up either personally or through our NP within 2-4 month.   CC: I will share my notes with MD.  Electronically signed by: Larey Seat, MD 08/21/2021 3:50 PM  [facility] Sleep Board certified by The [employer] of Sleep Medicine and Diplomate of the [employer] of Sleep Medicine. Board certified In Neurology  through the [place], Fellow of the [employer] of Neurology. Medical Director of [employer].

## 2021-08-25 ENCOUNTER — Other Ambulatory Visit (HOSPITAL_COMMUNITY): Payer: Self-pay

## 2021-09-02 ENCOUNTER — Encounter: Payer: Self-pay | Admitting: Neurology

## 2021-09-08 ENCOUNTER — Other Ambulatory Visit (HOSPITAL_COMMUNITY): Payer: Self-pay

## 2021-09-10 ENCOUNTER — Other Ambulatory Visit: Payer: Self-pay | Admitting: Internal Medicine

## 2021-09-10 ENCOUNTER — Other Ambulatory Visit (HOSPITAL_COMMUNITY): Payer: Self-pay

## 2021-09-10 MED ORDER — ZOLPIDEM TARTRATE 5 MG PO TABS
5.0000 mg | ORAL_TABLET | Freq: Every evening | ORAL | 1 refills | Status: DC | PRN
  Filled 2021-09-10: qty 30, 30d supply, fill #0
  Filled 2021-11-21: qty 30, 30d supply, fill #1

## 2021-09-10 MED ORDER — AMPHETAMINE-DEXTROAMPHETAMINE 10 MG PO TABS
ORAL_TABLET | Freq: Two times a day (BID) | ORAL | 0 refills | Status: DC
  Filled 2021-10-18: qty 60, 30d supply, fill #0

## 2021-09-10 MED ORDER — AMPHETAMINE-DEXTROAMPHETAMINE 10 MG PO TABS
ORAL_TABLET | Freq: Two times a day (BID) | ORAL | 0 refills | Status: DC
  Filled 2021-09-10: qty 60, 30d supply, fill #0
  Filled 2021-09-11: qty 60, fill #0
  Filled 2021-09-17: qty 60, 30d supply, fill #0

## 2021-09-10 NOTE — Progress Notes (Signed)
Needed refills of ambien and adderall

## 2021-09-11 ENCOUNTER — Other Ambulatory Visit (HOSPITAL_COMMUNITY): Payer: Self-pay

## 2021-09-15 ENCOUNTER — Other Ambulatory Visit (HOSPITAL_COMMUNITY): Payer: Self-pay

## 2021-09-17 ENCOUNTER — Other Ambulatory Visit (HOSPITAL_COMMUNITY): Payer: Self-pay

## 2021-09-19 ENCOUNTER — Other Ambulatory Visit (HOSPITAL_COMMUNITY): Payer: Self-pay

## 2021-09-21 ENCOUNTER — Telehealth: Payer: Self-pay | Admitting: Internal Medicine

## 2021-09-21 DIAGNOSIS — E038 Other specified hypothyroidism: Secondary | ICD-10-CM

## 2021-09-21 DIAGNOSIS — R7303 Prediabetes: Secondary | ICD-10-CM

## 2021-09-21 DIAGNOSIS — R7989 Other specified abnormal findings of blood chemistry: Secondary | ICD-10-CM

## 2021-09-21 DIAGNOSIS — R42 Dizziness and giddiness: Secondary | ICD-10-CM

## 2021-09-21 DIAGNOSIS — R6889 Other general symptoms and signs: Secondary | ICD-10-CM

## 2021-09-21 NOTE — Telephone Encounter (Signed)
Received call from [place] she had a dizzy spell yesterday where she suddently felt weak and dizzy, felt like blood sugar could be low but did not have glucometer nearby.  Checked her temperature and was 94.7 F.  She ate something and felt better, later when sugar was checked was 111 but still feeling weak and cold recheck of temp was 97.  Blood pressure normal.  Took an extra synthroid tablet yesterday.  No other recent illness however daughter is sick with fevers.  Will have her in this week to see me for a visit.  She has hypothyroidism and had a low normal cortisol level last year.  Will recheck CMP, CBC, cortisiol (with ACTH level), TSH

## 2021-09-22 ENCOUNTER — Other Ambulatory Visit (INDEPENDENT_AMBULATORY_CARE_PROVIDER_SITE_OTHER): Payer: 59

## 2021-09-22 DIAGNOSIS — R42 Dizziness and giddiness: Secondary | ICD-10-CM | POA: Diagnosis not present

## 2021-09-22 DIAGNOSIS — R7303 Prediabetes: Secondary | ICD-10-CM | POA: Diagnosis not present

## 2021-09-22 DIAGNOSIS — R7989 Other specified abnormal findings of blood chemistry: Secondary | ICD-10-CM

## 2021-09-22 DIAGNOSIS — E063 Autoimmune thyroiditis: Secondary | ICD-10-CM | POA: Diagnosis not present

## 2021-09-22 DIAGNOSIS — E038 Other specified hypothyroidism: Secondary | ICD-10-CM | POA: Diagnosis not present

## 2021-09-22 DIAGNOSIS — R6889 Other general symptoms and signs: Secondary | ICD-10-CM

## 2021-09-23 ENCOUNTER — Telehealth: Payer: Self-pay | Admitting: Internal Medicine

## 2021-09-23 ENCOUNTER — Other Ambulatory Visit (HOSPITAL_COMMUNITY): Payer: Self-pay

## 2021-09-23 LAB — CBC WITH DIFFERENTIAL/PLATELET
Basophils Absolute: 0 10*3/uL (ref 0.0–0.2)
Basos: 0 %
EOS (ABSOLUTE): 0.1 10*3/uL (ref 0.0–0.4)
Eos: 1 %
Hematocrit: 38.8 % (ref 34.0–46.6)
Hemoglobin: 13.2 g/dL (ref 11.1–15.9)
Immature Grans (Abs): 0 10*3/uL (ref 0.0–0.1)
Immature Granulocytes: 0 %
Lymphocytes Absolute: 2.5 10*3/uL (ref 0.7–3.1)
Lymphs: 31 %
MCH: 30.6 pg (ref 26.6–33.0)
MCHC: 34 g/dL (ref 31.5–35.7)
MCV: 90 fL (ref 79–97)
Monocytes Absolute: 0.5 10*3/uL (ref 0.1–0.9)
Monocytes: 7 %
Neutrophils Absolute: 4.9 10*3/uL (ref 1.4–7.0)
Neutrophils: 61 %
Platelets: 344 10*3/uL (ref 150–450)
RBC: 4.31 x10E6/uL (ref 3.77–5.28)
RDW: 11.9 % (ref 11.7–15.4)
WBC: 8.1 10*3/uL (ref 3.4–10.8)

## 2021-09-23 LAB — CMP14 + ANION GAP
ALT: 14 IU/L (ref 0–32)
AST: 15 IU/L (ref 0–40)
Albumin/Globulin Ratio: 2.4 — ABNORMAL HIGH (ref 1.2–2.2)
Albumin: 4.3 g/dL (ref 3.8–4.8)
Alkaline Phosphatase: 81 IU/L (ref 44–121)
Anion Gap: 15 mmol/L (ref 10.0–18.0)
BUN/Creatinine Ratio: 14 (ref 9–23)
BUN: 12 mg/dL (ref 6–20)
Bilirubin Total: 0.3 mg/dL (ref 0.0–1.2)
CO2: 20 mmol/L (ref 20–29)
Calcium: 8.9 mg/dL (ref 8.7–10.2)
Chloride: 104 mmol/L (ref 96–106)
Creatinine, Ser: 0.85 mg/dL (ref 0.57–1.00)
Globulin, Total: 1.8 g/dL (ref 1.5–4.5)
Glucose: 100 mg/dL — ABNORMAL HIGH (ref 70–99)
Potassium: 4.1 mmol/L (ref 3.5–5.2)
Sodium: 139 mmol/L (ref 134–144)
Total Protein: 6.1 g/dL (ref 6.0–8.5)
eGFR: 93 mL/min/{1.73_m2} (ref >59)

## 2021-09-23 LAB — CORTISOL: Cortisol: 6.8 ug/dL

## 2021-09-23 LAB — ACTH: ACTH: 9.1 pg/mL (ref 7.2–63.3)

## 2021-09-23 LAB — HEMOGLOBIN A1C
Est. average glucose Bld gHb Est-mCnc: 111 mg/dL
Hgb A1c MFr Bld: 5.5 % (ref 4.8–5.6)

## 2021-09-23 LAB — TSH: TSH: 0.854 u[IU]/mL (ref 0.450–4.500)

## 2021-09-23 MED ORDER — DEXAMETHASONE 1 MG PO TABS
0.5000 mg | ORAL_TABLET | Freq: Every day | ORAL | 0 refills | Status: DC | PRN
  Filled 2021-09-23 (×2): qty 15, 15d supply, fill #0

## 2021-09-23 NOTE — Addendum Note (Signed)
Addended by: Lucious Groves on: 09/23/2021 09:42 AM   Modules accepted: Orders

## 2021-09-23 NOTE — Telephone Encounter (Signed)
Reviewed results with Hoyle Sauer, cortisol result is intermediatly low given time of collection, this is the second time we have had an abdnormal 8am cortisol.  ACTH is wnl, along with TSH.  Recommended to get stim test.  She is going out of town on Wednesday [facility]) and will not be able to get testing done before that, will provide a backup of oral dexamethasone if needed, also will bring blood pressure cuff and glucometer.

## 2021-09-23 NOTE — Progress Notes (Signed)
Cortsiol remains low at 6.8.  Given her symptoms I am concerned for adrenal insufficiency, will need ACTH stim testing preformed, will also check aldosterone with baseline cortisol.

## 2021-09-24 NOTE — Telephone Encounter (Signed)
Asked by Dr Philipp Ovens to schedule ACTH stim test. I called [place], [facility], appt schedule 2/16 @ 0900 AM; register at Admissions prior to appt time. Dr Philipp Ovens informed.

## 2021-09-30 ENCOUNTER — Ambulatory Visit (INDEPENDENT_AMBULATORY_CARE_PROVIDER_SITE_OTHER): Payer: 59 | Admitting: Internal Medicine

## 2021-09-30 ENCOUNTER — Ambulatory Visit (HOSPITAL_COMMUNITY)
Admission: RE | Admit: 2021-09-30 | Discharge: 2021-09-30 | Disposition: A | Discharge status: Home / Self Care | Payer: 59 | Source: Ambulatory Visit | Attending: Internal Medicine | Admitting: Internal Medicine

## 2021-09-30 ENCOUNTER — Other Ambulatory Visit: Payer: Self-pay

## 2021-09-30 DIAGNOSIS — R42 Dizziness and giddiness: Secondary | ICD-10-CM | POA: Diagnosis not present

## 2021-10-01 ENCOUNTER — Other Ambulatory Visit (HOSPITAL_COMMUNITY): Payer: Self-pay | Admitting: *Deleted

## 2021-10-01 NOTE — Progress Notes (Signed)
Experierencing some dizziness at work today, has upcoming stim test on Thursday,  felt HR may have been alternating between high and low so obtained EKG which is NSR.

## 2021-10-02 ENCOUNTER — Ambulatory Visit (HOSPITAL_COMMUNITY)
Admission: RE | Admit: 2021-10-02 | Discharge: 2021-10-02 | Disposition: A | Discharge status: Home / Self Care | Payer: 59 | Source: Ambulatory Visit | Attending: Internal Medicine | Admitting: Internal Medicine

## 2021-10-02 DIAGNOSIS — R7989 Other specified abnormal findings of blood chemistry: Secondary | ICD-10-CM | POA: Diagnosis not present

## 2021-10-02 LAB — ACTH STIMULATION, 3 TIME POINTS
Cortisol, 30 Min: 18.7 ug/dL
Cortisol, 60 Min: 17.4 ug/dL
Cortisol, Base: 5.9 ug/dL

## 2021-10-02 MED ORDER — COSYNTROPIN 0.25 MG IJ SOLR
INTRAMUSCULAR | Status: AC
  Administered 2021-10-02: 0.25 mg via INTRAVENOUS
  Filled 2021-10-02: qty 0.25

## 2021-10-02 MED ORDER — COSYNTROPIN 0.25 MG IJ SOLR: 0.2500 mg | Freq: Once | INTRAMUSCULAR | Status: AC

## 2021-10-03 LAB — ACTH: C206 ACTH: 10.5 pg/mL (ref 7.2–63.3)

## 2021-10-08 ENCOUNTER — Other Ambulatory Visit (HOSPITAL_COMMUNITY): Payer: Self-pay

## 2021-10-09 ENCOUNTER — Other Ambulatory Visit (HOSPITAL_COMMUNITY): Payer: Self-pay

## 2021-10-09 LAB — ALDOSTERONE: Aldosterone: 23.3 ng/dL (ref 0.0–30.0)

## 2021-10-16 ENCOUNTER — Encounter: Payer: Self-pay | Admitting: Internal Medicine

## 2021-10-16 ENCOUNTER — Ambulatory Visit (INDEPENDENT_AMBULATORY_CARE_PROVIDER_SITE_OTHER): Payer: 59 | Admitting: Internal Medicine

## 2021-10-16 ENCOUNTER — Other Ambulatory Visit: Payer: Self-pay

## 2021-10-16 VITALS — BP 119/81 | HR 65 | Temp 97.6°F | Ht 62.0 in | Wt 147.9 lb

## 2021-10-16 DIAGNOSIS — F909 Attention-deficit hyperactivity disorder, unspecified type: Secondary | ICD-10-CM

## 2021-10-16 DIAGNOSIS — Z6827 Body mass index (BMI) 27.0-27.9, adult: Secondary | ICD-10-CM | POA: Diagnosis not present

## 2021-10-16 DIAGNOSIS — E663 Overweight: Secondary | ICD-10-CM | POA: Diagnosis not present

## 2021-10-16 DIAGNOSIS — R42 Dizziness and giddiness: Secondary | ICD-10-CM | POA: Diagnosis not present

## 2021-10-16 DIAGNOSIS — E039 Hypothyroidism, unspecified: Secondary | ICD-10-CM | POA: Diagnosis not present

## 2021-10-16 DIAGNOSIS — G43109 Migraine with aura, not intractable, without status migrainosus: Secondary | ICD-10-CM

## 2021-10-16 NOTE — Assessment & Plan Note (Signed)
Her constellation of symptoms concerns me for a potential pituitary cause.  Specifically her increased frequency of headaches along with various intermittent symptoms of potential pituitary issues including some postpartum amenorrhea, dizziness, orthostasis, subjective hypoglycemia.  My gross visual field testing of her was normal and our pituitary testing so far has come back just barely within normal limits specifically with interest to cortisol testing on 3 occasions a.m. cortisol was in the 4-5 range but her stim test she stimmed just over 18. ?

## 2021-10-16 NOTE — Assessment & Plan Note (Addendum)
As noted she has had a interesting constellation of symptoms for which we have found some explanations for including hypothyroidism.  While initially treating her hypothyroidism did improve many symptoms she is still having some dizzy spells I noted that there can be an association between autoimmune thyroid disease and other endocrinopathies including adrenal insufficiency.  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 ?I am going to obtain an MRI of her brain to evaluate for a cause of her increased migraine frequency as well as evaluate her pituitary. ?Would also appreciate endocrinology review and consultation. ?

## 2021-10-16 NOTE — Assessment & Plan Note (Signed)
Overall her thyroid replacement seems to be effective although it has not fully resolved her symptoms of dizziness. ? ?I think given some of my concern for other endocrinopathies contributing I will seek a specialist opinion by referring over to endocrinology for consultation. ?

## 2021-10-16 NOTE — Assessment & Plan Note (Signed)
She has been holding Adderall since last Friday to ensure that it is not contributing to her symptoms. ?

## 2021-10-16 NOTE — Progress Notes (Signed)
?Subjective:  ?HPI: ?[name] is a 30 y.o. female who presents for f/u dizzy spells/ adrenal testing. ?Back Story: ?Catherine Chang first came to see me around July 2020 when she was having neck case of severe abdominal pain our work-up at that time was reassuring.  Then during her second pregnancy, second trimester in spring 2021 she had a worsening of her migraines for which we treated with Fioricet.  In her third trimester she had preterm labor, low amniotic fluid and was briefly admitted to the hospital for potential early delivery she was started on nifedipine but did not tolerate well feeling very dizzy with low blood pressure was on bedrest for the last week but eventually delivered at 37 weeks via C-section.  After delivery breast-feeding went okay but she developed severe fatigue and some hair falling out along with dry skin and constipation in February 2022 work-up of this revealed hypothyroidism with a TSH of 47 and mildly decreased free T4 thyroid antibodies were positive she started on Synthroid and felt much better.  Post pregnancy her periods resumed and her constipation resolved.  She even briefly stopped Adderall for ADHD with overall improvement.  Throughout much of the rest of 2022 she had some difficulties with increased migraines for which we adjusted medications and eventually in late 2020 to send her to neurology.  In May 2022 she was still not having regular periods and we did some screening labs for amenorrhea overall her pituitary hormones were reassuring with the exception of an indeterminate cortisol level. ?In September 2022 Ozempic was started for weight loss and in November she had an episode of severe abdominal pain for which we obtained a CT was overall reassuring but potentially showed migratory cecum I suspect this in combination with the Ozempic may have contributed to her symptoms and she self discontinued Ozempic. ?In December 2022 she saw neurology for migraines she was  prescribed propranolol.  She attempted taking this medication twice but woke in the middle of the night sweaty and feeling hypoglycemic she has not taken it again.  In January she saw sleep neurology and has a pending sleep study. ? ?Recently Catherine Chang has been experiencing some severe dizzy spells.  She notified me of this on February 5 where over the weekend she had had a spell where she felt weak and dizzy thought possibly her blood sugar was low but did not have a glucometer nearby checked her temperature and noted to be 94.7.  She ate something and felt better later when she checked her blood sugar was 111 but she was still feeling weak and cold recheck of her temperature was 97.  Checked her blood pressure was normal at that time.  There have been multiple recent illnesses in her house with her 2 young children and she feels like this could be playing a role however the spells have been pretty severe.  About a year or 2 ago after the birth of her second daughter when she was experiencing some amenorrhea we had wondered about hormonal testing although at that time hormonal blood test were reassuring and we did not opt for an MRI of her head to assess for sheehan syndrome.  In the interim after her dizzy spell I did recheck some labs including ACTH, cortisol A1c and TSH.  These labs resulted with a intermediate range cortisol which is actually where she was when I checked it before.  Thus we opted for a ACTH stim test which she has completed.  Again her  cortisol level was intermediate range at baseline she did stim to just over 18 at the 30-minute mark.  Prior to this testing I had provided her with some low-dose dexamethasone with instructions to take if she had symptoms, she has had a few episodes 2 of which she did take low does dexamethasone and felt better. ? ? ?For her history of ADHD she did wean off Adderall for a time (August 2022) however then was experiencing some difficulty focusing at work and  restarted in November 2022.  She stopped adderall this past Friday to ensure this could not be playing a role. ? ? ?Please see Assessment and Plan below for the status of her chronic medical problems. ? ?Objective:  ?Physical Exam: ?Vitals:  ? 10/16/21 0900  ?BP: 119/81  ?Pulse: 65  ?Temp: 97.6 ?F (36.4 ?C)  ?TempSrc: Oral  ?SpO2: 100%  ?Weight: 147 lb 14.4 oz (67.1 kg)  ?Height: 5' 2"  (1.575 m)  ? ?Body mass index is 27.05 kg/m?Marland Kitchen ?Physical Exam ?Constitutional:   ?   Appearance: Normal appearance.  ?Eyes:  ?   General: No visual field deficit or scleral icterus. ?   Extraocular Movements: Extraocular movements intact.  ?   Conjunctiva/sclera: Conjunctivae normal.  ?Cardiovascular:  ?   Rate and Rhythm: Normal rate and regular rhythm.  ?   Pulses: Normal pulses.  ?   Heart sounds: Normal heart sounds.  ?Pulmonary:  ?   Effort: Pulmonary effort is normal.  ?Neurological:  ?   Mental Status: She is alert. Mental status is at baseline.  ?Psychiatric:     ?   Mood and Affect: Mood normal.     ?   Behavior: Behavior normal.     ?   Thought Content: Thought content normal.  ? ?Assessment & Plan:  ?See Encounters Tab for problem based charting. ? ?Medications Ordered ?No orders of the defined types were placed in this encounter. ? ?Other Orders ?Orders Placed This Encounter  ?Procedures  ? MR Brain W Wo Contrast  ?  Standing Status:   Future  ?  Standing Expiration Date:   10/17/2022  ?  Order Specific Question:   If indicated for the ordered procedure, I authorize the administration of contrast media per Radiology protocol  ?  Answer:   Yes  ?  Order Specific Question:   What is the patient's sedation requirement?  ?  Answer:   No Sedation  ?  Order Specific Question:   Does the patient have a pacemaker or implanted devices?  ?  Answer:   No  ?  Order Specific Question:   Preferred imaging location?  ?  Answer:   [facility] (table limit-550lbs)  ? ?Follow Up: ?Return in about 6 months (around 04/18/2022). ? ?

## 2021-10-16 NOTE — Assessment & Plan Note (Signed)
She was able to lose several pounds with Ozempic but given her episode of abdominal pain she has been wary to restart it overall her body weight is currently stable at BMI of 27.  She will remain off Ozempic at this time. ?

## 2021-10-18 ENCOUNTER — Other Ambulatory Visit (HOSPITAL_COMMUNITY): Payer: Self-pay

## 2021-10-22 ENCOUNTER — Ambulatory Visit (INDEPENDENT_AMBULATORY_CARE_PROVIDER_SITE_OTHER): Payer: 59 | Admitting: Neurology

## 2021-10-22 ENCOUNTER — Other Ambulatory Visit (HOSPITAL_COMMUNITY): Payer: Self-pay

## 2021-10-22 DIAGNOSIS — G4733 Obstructive sleep apnea (adult) (pediatric): Secondary | ICD-10-CM

## 2021-10-22 DIAGNOSIS — G43109 Migraine with aura, not intractable, without status migrainosus: Secondary | ICD-10-CM

## 2021-10-22 DIAGNOSIS — G478 Other sleep disorders: Secondary | ICD-10-CM

## 2021-10-22 DIAGNOSIS — R0683 Snoring: Secondary | ICD-10-CM

## 2021-10-22 DIAGNOSIS — Z820 Family history of epilepsy and other diseases of the nervous system: Secondary | ICD-10-CM

## 2021-10-23 NOTE — Progress Notes (Unsigned)
° ° ° ° ° ° ° ° ° ° ° ° ° ° ° ° ° ° ° ° ° ° °

## 2021-10-27 ENCOUNTER — Other Ambulatory Visit (HOSPITAL_COMMUNITY): Payer: Self-pay

## 2021-10-27 DIAGNOSIS — R932 Abnormal findings on diagnostic imaging of liver and biliary tract: Secondary | ICD-10-CM | POA: Diagnosis not present

## 2021-10-27 DIAGNOSIS — E039 Hypothyroidism, unspecified: Secondary | ICD-10-CM | POA: Diagnosis not present

## 2021-10-27 DIAGNOSIS — O905 Postpartum thyroiditis: Secondary | ICD-10-CM | POA: Diagnosis not present

## 2021-10-27 DIAGNOSIS — Z8349 Family history of other endocrine, nutritional and metabolic diseases: Secondary | ICD-10-CM | POA: Diagnosis not present

## 2021-10-27 DIAGNOSIS — R109 Unspecified abdominal pain: Secondary | ICD-10-CM | POA: Diagnosis not present

## 2021-10-27 DIAGNOSIS — Z92241 Personal history of systemic steroid therapy: Secondary | ICD-10-CM | POA: Diagnosis not present

## 2021-10-27 MED ORDER — LEVOTHYROXINE SODIUM 50 MCG PO TABS
ORAL_TABLET | ORAL | 4 refills | Status: DC
  Filled 2021-10-27: qty 90, 90d supply, fill #0
  Filled 2022-01-26: qty 90, 90d supply, fill #1

## 2021-10-28 ENCOUNTER — Other Ambulatory Visit (HOSPITAL_COMMUNITY): Payer: Self-pay

## 2021-10-28 DIAGNOSIS — Z820 Family history of epilepsy and other diseases of the nervous system: Secondary | ICD-10-CM | POA: Insufficient documentation

## 2021-10-28 DIAGNOSIS — G478 Other sleep disorders: Secondary | ICD-10-CM | POA: Insufficient documentation

## 2021-10-28 DIAGNOSIS — R0683 Snoring: Secondary | ICD-10-CM | POA: Insufficient documentation

## 2021-10-28 NOTE — Progress Notes (Signed)
IMPRESSION:  This HST confirms the presence of at least moderate snoring present for much of the patient's sleep time but did not indicate sleep apnea to be present.  An AHI under 5 is considered not abnormal and would not be treated.   ?There is also no indication of sleep hypoxia. ?? ?RECOMMENDATION: I would consider this most likely the scenario of an upper airway resistance syndrome.  Snoring can be treated by CPAP but is only covered if sleep apnea is present.  Snoring can be treated by a dental device but here also insurance covers the cost only if we treat a medical condition such as apnea aside from snoring.

## 2021-10-28 NOTE — Procedures (Signed)
? ?  ?  ?[facility] ?  ?HOME SLEEP TEST REPORT ( by Watch PAT)   ?STUDY DATE:  10-22-2021 ? ?  ?ORDERING CLINICIAN: Larey Seat, MD  ?REFERRING CLINICIAN:Jennifer Chima, MD .  ?PCP :  Dr Adair Laundry, DO ?  ?CLINICAL INFORMATION/HISTORY: 08/21/2021 .  ?Chief concern according to patient : Dr Hoyle Sauer R.Desanctis, MD , was referred  for snoring and possible sleep apnea.History of HA, insomnia and snoring. Pt reports worsening migraines over 2 years. Father was diagnosed in his 36s. His Migraine disappeared when apnea was treated, younger brother has OSA.  Paternal GF snored and died in his sleep at age 26. 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 First baby had cleft lip/ palate, more stress in feeding, nursing . she developed hypothyroidism.  ?  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 ?  ?Sleep relevant medical history: Snoring since childhood, sleeps prone for that reason. Wisdom teeth removed. No Tonsillectomy, but sinusitis, year around.   ?Family medical /sleep history: many other family members with OSA. ?Social history:  Patient is working as a Engineer, drilling and lives in a household with spouse and 2 young children and 2 dogs.  ? ? ? ?Epworth sleepiness score: 8/24. ?  ?BMI: 26.8 kg/m? ?  ?Neck Circumference: 14" ?  ?FINDINGS: ?  ?Sleep Summary: ?  ?Total Recording Time (hours, min):   Total recording time for this watch pat home sleep test amounted to 9 hours and 37 minutes of which 8 hours and 25 minutes was a calculated total sleep time.  REM sleep was seen and  27.7% of sleep time.                                    ?  ?Respiratory Indices: ?  ?Calculated pAHI (per hour):   The overall apnea-hypopnea index for the study was 3.0/h                        ?  ?REM pAHI: 6.5/h                                             ?  ?NREM pAHI: 1.6/h ?     Positional AHI: 472 minutes of recording time were taking place while the patient was in supine position with an AHI of 3.1 she spent 33.5 minutes of sleep time on her right side with an AHI of 1.8/h . ?At the same time the RDI, which includes snoring data , was much more prominent: ?Snoring reached a mean volume of 41 dB and snoring was present for 64% of total sleep time- which is high Only about 2 minutes of snoring were above 60 dB.   ? ?This is still a great discrepancy between snoring and the low AHI for sleep apnea which remained under clinically significant levels.                                               ?  ?  Oxygen Saturation Statistics: ?   ?O2 Saturation Range (%):      Range between a nadir of 91% and a maximum saturation at 99% with a mean saturation of 96% oxygen.                               ?  ?O2 Saturation (minutes) <89%:     0 minutes    ?  ?Pulse Rate Statistics: ?  ?Pulse Mean (bpm): 83 bpm             ?  ?Pulse Range:     Between 54 and 118 bpm .         ?  ?IMPRESSION:  This HST confirms the presence of at least moderate snoring present for much of the patient's sleep time but did not indicate sleep apnea to be present.  An AHI under 5 is considered not abnormal and would not be treated.   ?There is also no indication of sleep hypoxia. ?  ?RECOMMENDATION: I would consider this most likely the scenario of an upper airway resistance syndrome.  Snoring can be treated by CPAP but is only covered if sleep apnea is present.  Snoring can be treated by a dental device but here also insurance covers the cost only if we treat a medical condition such as apnea aside from snoring. ? ?  ?INTERPRETING PHYSICIAN: ? ?  Larey Seat, MD  ? ?Medical Director of [facility] [employer].  ? ? ? ? ? ? ? ? ? ? ? ? ? ? ? ? ? ? ?

## 2021-10-29 ENCOUNTER — Encounter: Payer: Self-pay | Admitting: Neurology

## 2021-10-31 ENCOUNTER — Other Ambulatory Visit: Payer: Self-pay

## 2021-10-31 ENCOUNTER — Ambulatory Visit
Admission: RE | Admit: 2021-10-31 | Discharge: 2021-10-31 | Disposition: A | Discharge status: Home / Self Care | Payer: 59 | Source: Ambulatory Visit | Attending: Internal Medicine | Admitting: Internal Medicine

## 2021-10-31 DIAGNOSIS — R519 Headache, unspecified: Secondary | ICD-10-CM | POA: Diagnosis not present

## 2021-10-31 DIAGNOSIS — G43109 Migraine with aura, not intractable, without status migrainosus: Secondary | ICD-10-CM

## 2021-10-31 IMAGING — MR MR HEAD WO/W CM
13 series · 48 of 48 positions shown · IV contrast (multihance)
Comparison: None.

CLINICAL DATA: Chronic headache with new features or increased
frequency

EXAM:
MRI HEAD WITHOUT AND WITH CONTRAST
TECHNIQUE: Multiplanar, multiecho pulse sequences of the brain and surrounding
structures were obtained without and with intravenous contrast.
CONTRAST:  13mL MULTIHANCE GADOBENATE DIMEGLUMINE 529 MG/ML IV SOLN

[Series 2: T1 · sagittal · 5.0mm · 0.45mm/px · 2 of 25 slices shown]
[im 1/25]
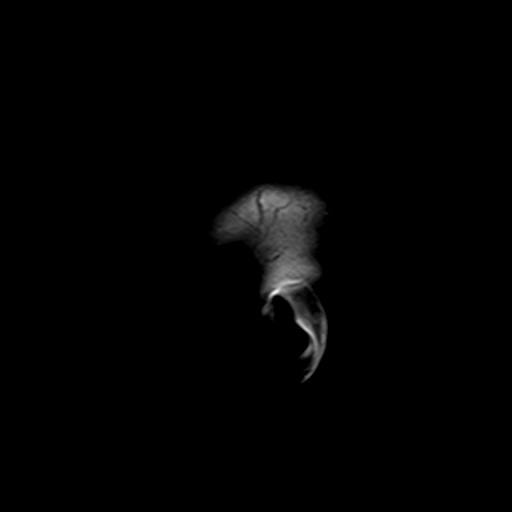
[im 25/25]
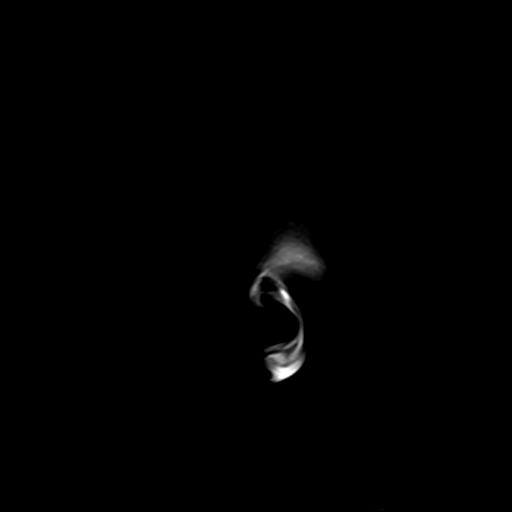

[Series 3: DWI · axial · 3.0mm · 1.80mm/px · z∈[-72,+81]mm · 7 of 104 slices shown (1 of 4)]
[im 1/104]
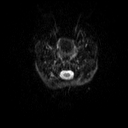
[im 18/104]
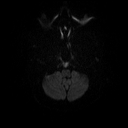
[im 35/104]
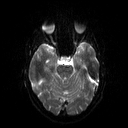
[im 52/104]
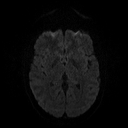
[im 69/104]
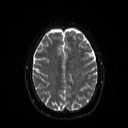
[im 86/104]
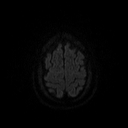
[im 104/104]
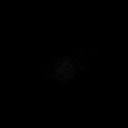

[Series 4: DWI · axial · 3.0mm · 1.80mm/px · z∈[-72,+81]mm · 3 of 52 slices shown (2 of 4)]
[im 1/52]
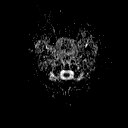
[im 26/52]
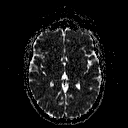
[im 52/52]
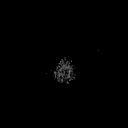

[Series 5: DWI · coronal · 5.0mm · 1.80mm/px · 5 of 74 slices shown (3 of 4)]
[im 1/74]
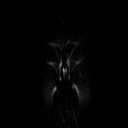
[im 19/74]
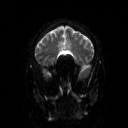
[im 37/74]
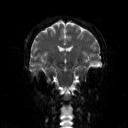
[im 55/74]
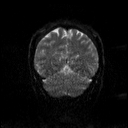
[im 74/74]
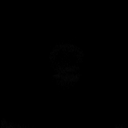

[Series 6: DWI · coronal · 5.0mm · 1.80mm/px · 2 of 37 slices shown (4 of 4)]
[im 1/37]
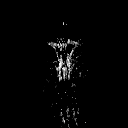
[im 37/37]
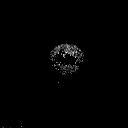

[Series 7: T2 · axial · 5.0mm · 0.72mm/px · 1 of 23 slices shown (1 of 2)]
[im 1/23]
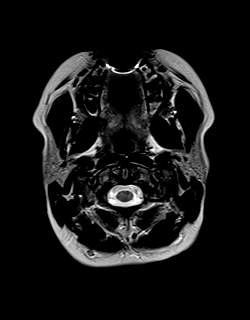

[Series 8: FLAIR · axial · 3.0mm · 0.45mm/px · z∈[-73,+71]mm · 2 of 32 slices shown]
[im 1/32]
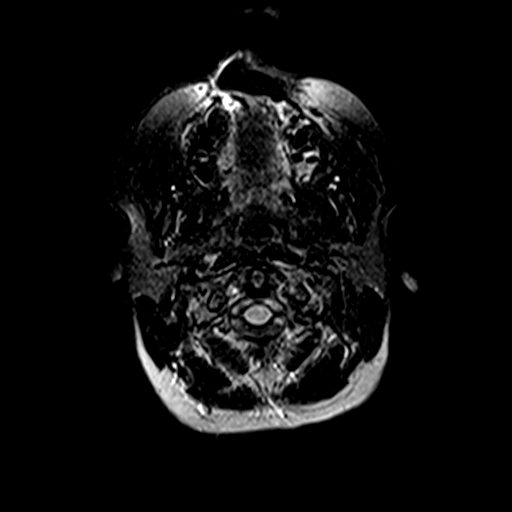
[im 32/32]
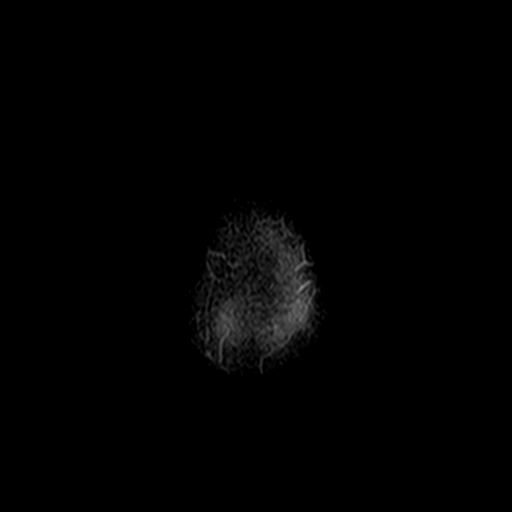

[Series 9: mip_images(sw) · axial · 32.0mm · 0.90mm/px · z∈[-51,+61]mm · 2 of 29 slices shown]
[im 1/29]
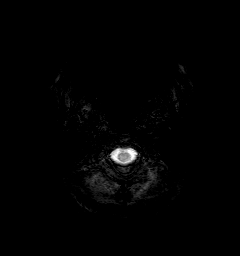
[im 29/29]
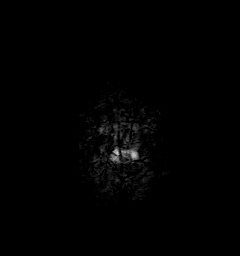

[Series 10: swi_images · axial · 4.0mm · 0.90mm/px · z∈[-65,+75]mm · 2 of 36 slices shown]
[im 1/36]
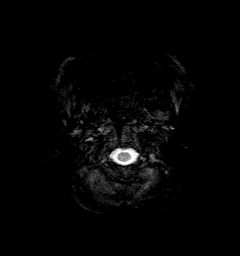
[im 36/36]
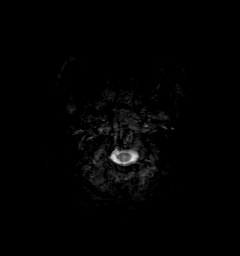

[Series 11: t1_mpr_tra · axial · 1.0mm · 0.75mm/px · z∈[-71,+72]mm · 9 of 144 slices shown]
[im 1/144]
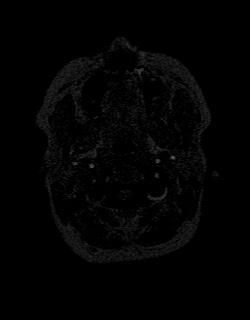
[im 18/144]
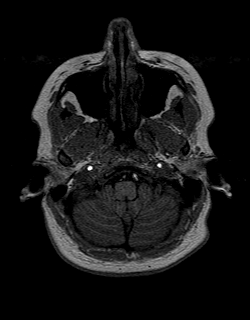
[im 36/144]
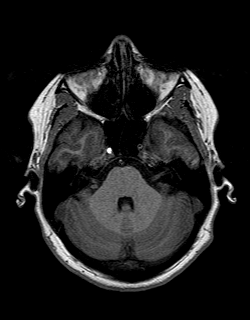
[im 54/144]
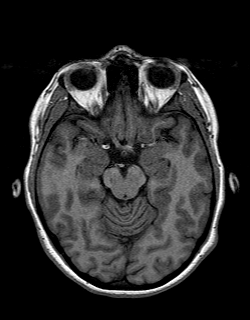
[im 72/144]
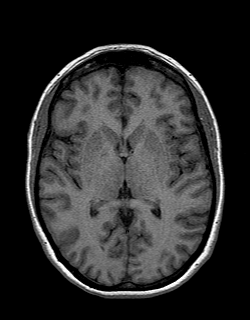
[im 90/144]
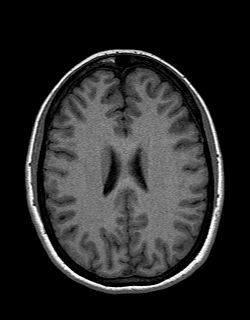
[im 108/144]
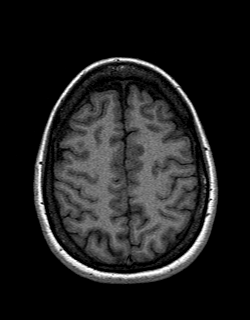
[im 126/144]
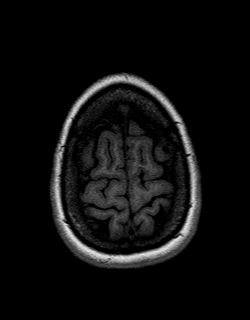
[im 144/144]
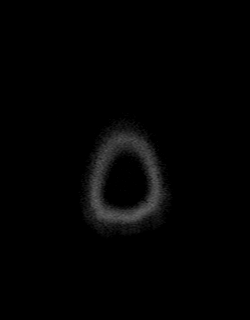

[Series 12: T2 · coronal · 5.0mm · 0.45mm/px · 2 of 28 slices shown (2 of 2)]
[im 1/28]
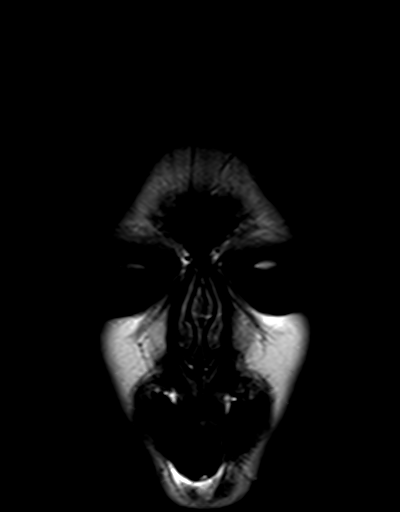
[im 28/28]
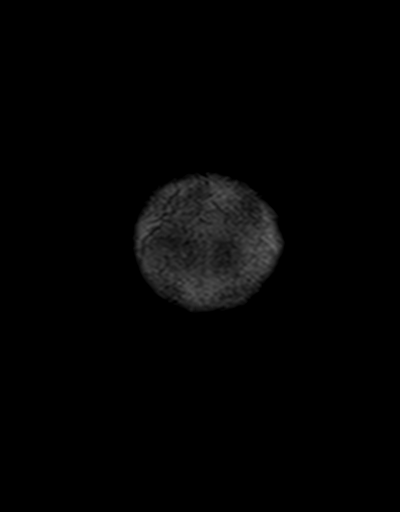

[Series 13: t1_mpr_tra post · axial · 1.0mm · 0.75mm/px · z∈[-71,+72]mm · 9 of 144 slices shown]
[im 1/144]
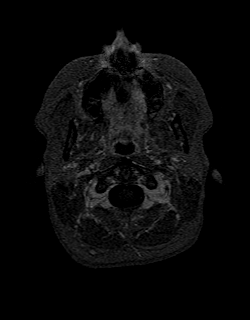
[im 18/144]
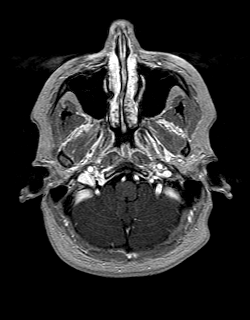
[im 36/144]
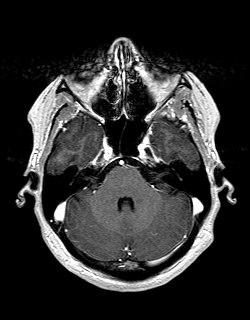
[im 54/144]
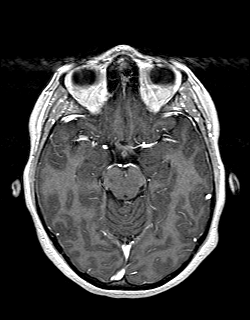
[im 72/144]
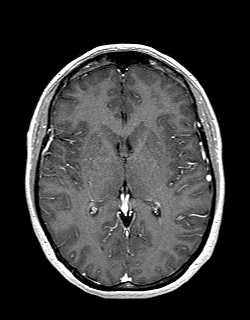
[im 90/144]
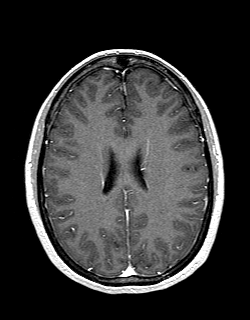
[im 108/144]
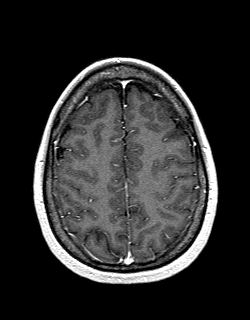
[im 126/144]
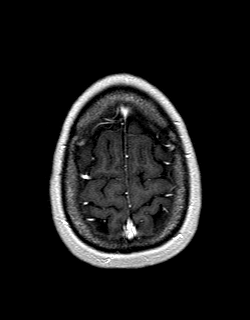
[im 144/144]
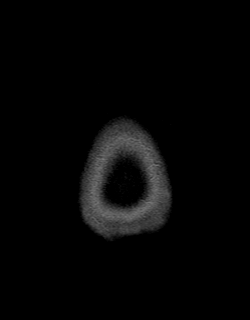

[Series 14: post cor · coronal · 5.0mm · 0.45mm/px · 2 of 28 slices shown]
[im 1/28]
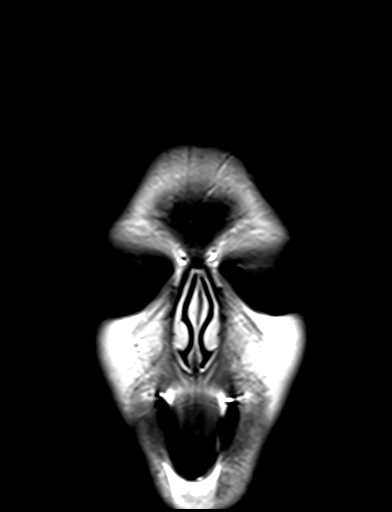
[im 28/28]
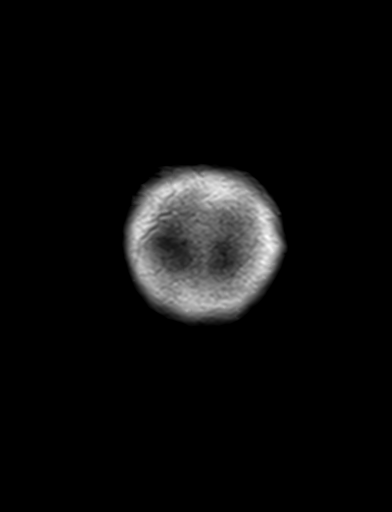

[48 of 48 positions shown; findings below may reference images not displayed]

FINDINGS: Brain: No acute infarction, hemorrhage, hydrocephalus, extra-axial
collection or mass lesion. No white matter disease, atrophy, or
abnormal enhancement. Single remote white matter insult in the right
frontal region, allowable for age.

Vascular: Normal flow voids.

Skull and upper cervical spine: Normal marrow signal.

Sinuses/Orbits: Negative.
IMPRESSION: Negative brain MRI.  No explanation for headache.

## 2021-10-31 MED ORDER — GADOBENATE DIMEGLUMINE 529 MG/ML IV SOLN
13.0000 mL | Freq: Once | INTRAVENOUS | Status: AC | PRN
  Administered 2021-10-31: 13 mL via INTRAVENOUS

## 2021-11-04 ENCOUNTER — Other Ambulatory Visit: Payer: Self-pay | Admitting: Internal Medicine

## 2021-11-04 ENCOUNTER — Other Ambulatory Visit: Payer: Self-pay

## 2021-11-04 ENCOUNTER — Ambulatory Visit (INDEPENDENT_AMBULATORY_CARE_PROVIDER_SITE_OTHER): Payer: 59 | Admitting: Internal Medicine

## 2021-11-04 DIAGNOSIS — R894 Abnormal immunological findings in specimens from other organs, systems and tissues: Secondary | ICD-10-CM

## 2021-11-04 DIAGNOSIS — G629 Polyneuropathy, unspecified: Secondary | ICD-10-CM

## 2021-11-04 NOTE — Progress Notes (Unsigned)
See note later today.  Seen for neuropathic symptoms.  ?

## 2021-11-05 ENCOUNTER — Other Ambulatory Visit: Payer: Self-pay

## 2021-11-05 ENCOUNTER — Ambulatory Visit: Payer: 59 | Admitting: Gastroenterology

## 2021-11-05 DIAGNOSIS — R768 Other specified abnormal immunological findings in serum: Secondary | ICD-10-CM | POA: Diagnosis not present

## 2021-11-05 LAB — URINALYSIS, ROUTINE W REFLEX MICROSCOPIC
Bilirubin, UA: NEGATIVE
Glucose, UA: NEGATIVE
Ketones, UA: NEGATIVE
Leukocytes,UA: NEGATIVE
Nitrite, UA: NEGATIVE
Protein,UA: NEGATIVE
RBC, UA: NEGATIVE
Specific Gravity, UA: 1.005 (ref 1.005–1.030)
Urobilinogen, Ur: 0.2 mg/dL (ref 0.2–1.0)
pH, UA: 7 (ref 5.0–7.5)

## 2021-11-05 NOTE — Progress Notes (Signed)
? ?  Subjective:  ? ? Patient ID: Catherine Chang, female    DOB: 1990/09/01, 31 y.o.   MRN: [id number] ? ?CC: Acute visit for numbness right arm ? ?HPI ? ?Catherine Chang is a 31 year old woman who has been following up recently with concerning symptoms including dizziness, weakness, fatigue and + TTG antibodies.  She has also been previously diagnosed with hashimoto's thyroiditis and is on synthroid.  She has completed a CGM showing post prandial hypoglycemia. Over the weekend she noted fatigue, change in sensation and urinary incontinence.  She has work up with GI for possible celiac disease upcoming.  She further has constipation, no fecal incontinence, some saddle sensation changes.  ? ?Review of Systems  ?Constitutional:  Positive for fatigue. Negative for fever.  ?Musculoskeletal:  Positive for arthralgias.  ?Skin:  Positive for color change and pallor.  ?Neurological:  Positive for dizziness, weakness, numbness and headaches. Negative for syncope, facial asymmetry and speech difficulty.  ?Psychiatric/Behavioral:  The patient is nervous/anxious.   ? ?   ?Objective:  ? Physical Exam ?Constitutional:   ?   Appearance: Normal appearance.  ?HENT:  ?   Head: Normocephalic and atraumatic.  ?Neurological:  ?   Mental Status: She is alert and oriented to person, place, and time. Mental status is at baseline.  ?   GCS: GCS eye subscore is 4. GCS verbal subscore is 5. GCS motor subscore is 6.  ?   Cranial Nerves: No cranial nerve deficit, dysarthria or facial asymmetry.  ?   Sensory: Sensory deficit (She has mild sensation difference on the right in the Facial nerve distribution.  She also noted mild decreased senation to pin prick in a saddle distribution of the legs and mildly on the right thigh. Her vibratory sense and proprioception were intact) present.  ?   Motor: Motor function is intact. No weakness, tremor, atrophy, abnormal muscle tone or pronator drift.  ?   Coordination: Coordination is intact. Coordination normal.  Finger-Nose-Finger Test and Heel to [facility] Test normal.  ?   Gait: Gait is intact.  ?   Deep Tendon Reflexes:  ?   Reflex Scores: ?     Bicep reflexes are 2+ on the right side and 2+ on the left side. ?     Brachioradialis reflexes are 2+ on the right side and 2+ on the left side. ?     Patellar reflexes are 2+ on the right side and 2+ on the left side. ?     Achilles reflexes are 2+ on the right side and 2+ on the left side. ? ? ?Check B vitamins, folate, CBC, vitamin E ?UA and UC ? ? ?   ?Assessment & Plan:  ? ?Abnormal celiac tests, sensory deficits, urinary incontinence ?- Will check for vitamin deficiencies ?- Will check UA and UC for urinary issues ?- She will follow up very soon for confirmatory EGD with GI, she will remain on gluten for now ?- Work excuse provided.  ?- If no improvement after gluten free diet (presumed) will plan for repeat MRI brain with C/T/L spine as well ?

## 2021-11-05 NOTE — Addendum Note (Signed)
Addended by: Wayna Chalet on: 11/05/2021 02:55 PM ? ? Modules accepted: Orders ? ?

## 2021-11-05 NOTE — Progress Notes (Signed)
?  ?Catherine Chang  ?[address]  ?[address]  ?[place], [place] [address]  ?Main: [phone]  ?Fax: [phone] ? ? ?Gastroenterology Consultation ? ?Referring Provider:     Lucious Groves, DO ?Primary Care Physician:  Lucious Groves, DO ?Reason for Consultation:     Positive celiac serology  ?      ? HPI:   ?Virtual Visit via video  Note ? ?I connected with patient on 11/05/21 at 11:30 AM EDT by video  and verified that I am speaking with the correct person using two identifiers. ?  ?I discussed the limitations, risks, security and privacy concerns of performing an evaluation and management service by video and the availability of in person appointments. I also discussed with the patient that there may be a patient responsible charge related to this service. The patient expressed understanding and agreed to proceed. ? ?Location of the patient: Home ?Location of provider: Home ?Participating persons: Patient and provider only ? ? ?History of Present Illness: ?Chief Complaint  ?Patient presents with  ? Celiac Disease  ?  ? 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 Heber [place], Rachel Moulds, DO.   ? ?She has been referred to see me for a positive celiac blood test.  She needs reviewed the results with me and the cutoff is 10 and her TTG IgG was 72.  No known family history of celiac disease but mother had GI issues.  She herself has not been diagnosed with GI issues but has had a history of constipation.  Some [place] ancestry.  She has multiple other symptoms and has undergone an extensive endocrinological work-up.  She has had 2 C-sections. ? ?11/04/2021 hemoglobin 12.8 g MCV of 91.,Year back was found to have hypothyroidism. ? ? ? ? ?Past Medical History:  ?Diagnosis Date  ? ADHD   ? Amniotic fluid index decreased 04/03/2020  ? Depression   ? Gestational hypertension 04/11/2020  ? Impaired glucose tolerance in pregnancy 09/04/2017  ? Oligohydramnios antepartum 04/03/2020  ?  Status post repeat low transverse cesarean section 04/10/2020  ? ? ?Past Surgical History:  ?Procedure Laterality Date  ? BREAST REDUCTION SURGERY Bilateral 2006  ? BUNIONECTOMY Right 2005  ? [place]  ? CESAREAN SECTION N/A 03/23/2018  ? Procedure: PRIMARY CESAREAN SECTION;  Surgeon: Everett Graff, MD;  Location: [place];  Service: Obstetrics;  Laterality: N/A;  RNFA Requested  ? CESAREAN SECTION N/A 04/10/2020  ? Procedure: CESAREAN SECTION;  Surgeon: Sanjuana Kava, MD;  Location: [place] [facility];  Service: Obstetrics;  Laterality: N/A;  ? TUBAL LIGATION Bilateral 04/10/2020  ? Procedure: BILATERAL TUBAL LIGATION;  Surgeon: Sanjuana Kava, MD;  Location: [place] [facility];  Service: Obstetrics;  Laterality: Bilateral;  ? ? ?Prior to Admission medications   ?Medication Sig Start Date End Date Taking? Authorizing Provider  ?amphetamine-dextroamphetamine (ADDERALL) 10 MG tablet TAKE 1 TABLET BY MOUTH TWICE DAILY. 09/10/21 03/09/22  Lucious Groves, DO  ?amphetamine-dextroamphetamine (ADDERALL) 10 MG tablet TAKE 1 TABLET BY MOUTH TWICE DAILY.  **10/10/2021 10/10/21 04/08/22  Lucious Groves, DO  ?buPROPion (WELLBUTRIN SR) 150 MG 12 hr tablet Take 1 tablet (150 mg total) by mouth daily. 06/20/21 06/20/22  Lucious Groves, DO  ?dexamethasone (DECADRON) 1 MG tablet Take 0.5-1 tablet (0.5-1 mg total) by mouth daily as needed (hypoglycemia, hypotension). 09/23/21   Lucious Groves, DO  ?fluticasone (FLONASE) 50 MCG/ACT nasal spray Place 1 spray into  both nostrils daily as needed for allergies or rhinitis.    [provider]  ?levothyroxine (SYNTHROID) 50 MCG tablet Take 1 tablet by mouth daily in the morning on an empty stomach 10/27/21     ?levothyroxine (SYNTHROID) 75 MCG tablet TAKE 1 TABLET BY MOUTH ONCE A DAY BEFORE BREAKFAST 01/02/21 01/07/22  Lucious Groves, DO  ?Rimegepant Sulfate (NURTEC) 75 MG TBDP Dissolve 1 tablet by mouth daily as needed for migraine. 06/19/21   Lucious Groves, DO  ?traZODone (DESYREL) 50 MG tablet  TAKE 1 TABLET BY MOUTH AT BEDTIME AS NEEDED FOR SLEEP 05/01/21 05/01/22  Lucious Groves, DO  ?zolpidem (AMBIEN) 5 MG tablet Take 1 tablet (5 mg total) by mouth at bedtime as needed for sleep. 09/10/21   Lucious Groves, DO  ? ? ?Family History  ?Problem Relation Age of Onset  ? Hypothyroidism Mother   ? Obstructive Sleep Apnea Father   ? Bipolar disorder Brother   ? Obstructive Sleep Apnea Brother   ? Hypothyroidism Maternal Aunt   ? Hypothyroidism Maternal Grandmother   ? Heart disease Maternal Grandfather   ? Obstructive Sleep Apnea Paternal Grandmother   ? Heart disease Paternal Grandfather   ?     sudden cardiac arrest  ? Obstructive Sleep Apnea Paternal Grandfather   ? Cleft palate Daughter   ?  ? ?Social History  ? ?Tobacco Use  ? Smoking status: Never  ? Smokeless tobacco: Never  ?Vaping Use  ? Vaping Use: Never used  ?Substance Use Topics  ? Alcohol use: Yes  ?  Comment: Occasionallly.  ? Drug use: Never  ? ? ?Allergies as of 11/05/2021  ? (No Known Allergies)  ? ? ?Review of Systems:    ?All systems reviewed and negative except where noted in HPI. ?General Appearance:    Alert, cooperative, no distress, appears stated age  ?Head:    Normocephalic, without obvious abnormality, atraumatic  ?Eyes:    PERRL, conjunctiva/corneas clear,  ?Ears:    Grossly normal hearing   ? ?Neurologic:   Grossly appears normal   ? ? ?Observations/Objective: ? ?Labs: ?CBC ?   ?Component Value Date/Time  ? WBC 7.9 11/04/2021 1420  ? WBC 8.4 06/24/2021 1048  ? RBC 4.27 11/04/2021 1420  ? RBC 4.37 06/24/2021 1048  ? HGB 12.8 11/04/2021 1420  ? HCT 38.8 11/04/2021 1420  ? PLT 361 11/04/2021 1420  ? MCV 91 11/04/2021 1420  ? MCH 30.0 11/04/2021 1420  ? MCH 30.7 06/24/2021 1048  ? MCHC 33.0 11/04/2021 1420  ? MCHC 33.3 06/24/2021 1048  ? RDW 11.9 11/04/2021 1420  ? LYMPHSABS 2.3 11/04/2021 1420  ? MONOABS 0.6 06/24/2021 1048  ? EOSABS 0.0 11/04/2021 1420  ? BASOSABS 0.0 11/04/2021 1420  ? ?CMP  ?   ?Component Value Date/Time  ? NA 139  09/22/2021 0855  ? K 4.1 09/22/2021 0855  ? CL 104 09/22/2021 0855  ? CO2 20 09/22/2021 0855  ? GLUCOSE 100 (H) 09/22/2021 0855  ? GLUCOSE 97 06/24/2021 1048  ? BUN 12 09/22/2021 0855  ? CREATININE 0.85 09/22/2021 0855  ? CALCIUM 8.9 09/22/2021 0855  ? PROT 6.1 09/22/2021 0855  ? ALBUMIN 4.3 09/22/2021 0855  ? AST 15 09/22/2021 0855  ? ALT 14 09/22/2021 0855  ? ALKPHOS 81 09/22/2021 0855  ? BILITOT 0.3 09/22/2021 0855  ? GFRNONAA >60 06/24/2021 1048  ? GFRAA 119 10/03/2020 1131  ? ? ?Imaging Studies: ?MR Brain W Wo Contrast ? ?Result Date: 11/03/2021 ?  CLINICAL DATA:  Chronic headache with new features or increased frequency EXAM: MRI HEAD WITHOUT AND WITH CONTRAST TECHNIQUE: Multiplanar, multiecho pulse sequences of the brain and surrounding structures were obtained without and with intravenous contrast. CONTRAST:  25m MULTIHANCE GADOBENATE DIMEGLUMINE 529 MG/ML IV SOLN COMPARISON:  None. FINDINGS: Brain: No acute infarction, hemorrhage, hydrocephalus, extra-axial collection or mass lesion. No white matter disease, atrophy, or abnormal enhancement. Single remote white matter insult in the right frontal region, allowable for age. Vascular: Normal flow voids. Skull and upper cervical spine: Normal marrow signal. Sinuses/Orbits: Negative. IMPRESSION: Negative brain MRI.  No explanation for headache. Electronically Signed   By: JJorje GuildM.D.   On: 11/03/2021 06:52  ? ?Home sleep test ? ?Result Date: 10/22/2021 ?Dohmeier, CAsencion Partridge MD     10/28/2021  5:45 PM   [facility]  STUDY DATE:  10-22-2021  ORDERING CLINICIAN: CLarey Seat MD REFERRING CLINICIAN:Jennifer CBilley Gosling MD . PCP :  Dr HAdair Laundry DO  CLINICAL INFORMATION/HISTORY: 08/21/2021 . Chief concern according to patient : Dr CHoyle SauerR.Kipper, MD , was referred  for snoring and possible sleep apnea.History of HA, insomnia and snoring. Pt reports worsening migraines over 2 years. Father was diagnosed in his  5100s His Migraine disappeared when apnea was treated, younger brother has OSA.  Paternal GF snored and died in his sleep at age 33 This Physician was diagnosed with ADHD in school age, migraine onset in

## 2021-11-06 ENCOUNTER — Other Ambulatory Visit (HOSPITAL_COMMUNITY): Payer: Self-pay

## 2021-11-06 ENCOUNTER — Telehealth: Payer: Self-pay

## 2021-11-06 NOTE — Telephone Encounter (Signed)
DECISION : ? ? ? ? ?This letter is to notify you that your prior authorization request for NURTEC ODT 75 MG  ?TABLET has been approved based upon the information we received from your healthcare  ?practitioner.  ?Benefits for all services are subject to terms, conditions and eligibility as outlined in the  ?benefit documentation in effect at the time services are provided. ?The authorization is effective for a maximum of 12 fill(s) from 11/06/2021 to 11/06/2022, as  ?long as you are enrolled as a member of your current health plan. ?The request was approved with a quantity restriction. ? ? ? ([place] ) ?

## 2021-11-06 NOTE — Telephone Encounter (Signed)
Pa for pt ( NURTEC 75 MG TAB ) came through on cover my meds was submitted with office notes from 3/2.Marland Kitchen awaiting approval or denial ?

## 2021-11-07 LAB — URINE CULTURE

## 2021-11-10 ENCOUNTER — Encounter: Payer: Self-pay | Admitting: Internal Medicine

## 2021-11-10 LAB — B12 AND FOLATE PANEL
Folate: 10.9 ng/mL (ref >3.0)
Vitamin B-12: 482 pg/mL (ref 232–1245)

## 2021-11-10 LAB — VITAMIN E
Vitamin E (Alpha Tocopherol): 13.9 mg/L (ref 5.9–19.4)
Vitamin E(Gamma Tocopherol): 1 mg/L (ref 0.7–4.9)

## 2021-11-10 LAB — CBC WITH DIFFERENTIAL/PLATELET
Basophils Absolute: 0 10*3/uL (ref 0.0–0.2)
Basos: 0 %
EOS (ABSOLUTE): 0 10*3/uL (ref 0.0–0.4)
Eos: 1 %
Hematocrit: 38.8 % (ref 34.0–46.6)
Hemoglobin: 12.8 g/dL (ref 11.1–15.9)
Immature Grans (Abs): 0 10*3/uL (ref 0.0–0.1)
Immature Granulocytes: 0 %
Lymphocytes Absolute: 2.3 10*3/uL (ref 0.7–3.1)
Lymphs: 29 %
MCH: 30 pg (ref 26.6–33.0)
MCHC: 33 g/dL (ref 31.5–35.7)
MCV: 91 fL (ref 79–97)
Monocytes Absolute: 0.4 10*3/uL (ref 0.1–0.9)
Monocytes: 5 %
Neutrophils Absolute: 5.2 10*3/uL (ref 1.4–7.0)
Neutrophils: 65 %
Platelets: 361 10*3/uL (ref 150–450)
RBC: 4.27 x10E6/uL (ref 3.77–5.28)
RDW: 11.9 % (ref 11.7–15.4)
WBC: 7.9 10*3/uL (ref 3.4–10.8)

## 2021-11-10 LAB — VITAMIN B2, WHOLE BLOOD: Vitamin B2, Whole Blood: 233 ug/L (ref 137–370)

## 2021-11-10 LAB — VITAMIN B3
Nicotinamide: 15.7 ng/mL (ref 5.2–72.1)
Nicotinic Acid: <5 ng/mL (ref 0.0–5.0)

## 2021-11-10 LAB — VITAMIN B1: Thiamine: 138.1 nmol/L (ref 66.5–200.0)

## 2021-11-10 LAB — VITAMIN B6: Vitamin B6: 17.7 ug/L (ref 3.4–65.2)

## 2021-11-12 ENCOUNTER — Ambulatory Visit: Payer: 59 | Admitting: Psychiatry

## 2021-11-12 ENCOUNTER — Other Ambulatory Visit (HOSPITAL_COMMUNITY): Payer: Self-pay

## 2021-11-12 ENCOUNTER — Encounter: Payer: Self-pay | Admitting: Psychiatry

## 2021-11-12 VITALS — BP 115/68 | HR 93 | Ht 62.0 in | Wt 148.0 lb

## 2021-11-12 DIAGNOSIS — R202 Paresthesia of skin: Secondary | ICD-10-CM

## 2021-11-12 DIAGNOSIS — G43109 Migraine with aura, not intractable, without status migrainosus: Secondary | ICD-10-CM

## 2021-11-12 MED ORDER — NURTEC 75 MG PO TBDP
75.0000 mg | ORAL_TABLET | ORAL | 3 refills | Status: DC
  Filled 2021-11-12: qty 16, 30d supply, fill #0
  Filled 2021-12-08: qty 16, 30d supply, fill #1
  Filled 2022-01-19: qty 16, 30d supply, fill #2
  Filled 2022-02-25: qty 16, 30d supply, fill #3

## 2021-11-12 NOTE — Progress Notes (Signed)
? ?  CC:  headaches ? ?Follow-up Visit ? ?Last visit: 08/04/21 ? ?Brief HPI: ?31 year old female with a history of hypothyroidism, ADHD, depression who follows in clinic for migraines with aura. ? ?At her last visit she was started on propranolol for headache prevention. She was referred to Sleep for OSA evaluation. ? ?Interval History: ?Since her last visit her headaches have been worse in severity. Frequency is variable, she may have them for 2 weeks at a time but then will be headache free for a few weeks. She tried propranolol but this made her feel shaky and unwell, so she stopped it. Nurtec continues to work well for rescue, but she currently only gets 8 pills at a time from her pharmacy. She is currently using a savings card for the Nurtec. ? ?She has started to develop paresthesias in her RUE. Had a brain MRI 10/31/21 which was unremarkable other than mild nonspecific white matter changes. ? ?She is currently undergoing workup for Celiac disease.  ? ?Sleep study did not show evidence of sleep apnea. ? ?Headache days per month: 14 ?Headache free days per month: 16 ? ?Current Headache Regimen: ?Preventative: none ?Abortive: Nurtec 75 mg PRN ? ? ?Prior Therapies                                  ?Nurtec 75 mg PRN ?Propranolol 20 mg BID - side effects ? ?Physical Exam:  ? ?Vital Signs: ?BP 115/68   Pulse 93   Ht 5' 2"  (1.575 m)   Wt 148 lb (67.1 kg)   BMI 27.07 kg/m?  ?GENERAL:  well appearing, in no acute distress, alert  ?SKIN:  Color, texture, turgor normal. No rashes or lesions ?HEAD:  Normocephalic/atraumatic. ?RESP: normal respiratory effort ?MSK:  No gross joint deformities.  ? ?NEUROLOGICAL: ?Mental Status: Alert, oriented to person, place and time, Follows commands, and Speech fluent and appropriate. ?Cranial Nerves: PERRL, face symmetric, no dysarthria, hearing grossly intact ?Motor: moves all extremities equally ?Sensation: decreased sensation to light touch over RUE ?Gait:  normal-based. ? ?IMPRESSION: ?31 year old female with a history of hypothyroidism, ADHD, depression who presents for follow up of migraines. She was unable to tolerate propranolol due to side effects. Nurtec works well for her, will have her start taking this every other day for headache prevention. Discussed further testing for paresthesias, will plan to wait until her biopsy to assess for Celiac disease. If no underlying disorder is found or symptoms do not improve with removing gluten, will consider MRI C-spine. ? ?PLAN: ?-Start taking Nurtec every other day for headache prevention ?-Next steps: Consider topamax/zonisamide, CGRP for prevention. Consider MRI C-spine if paresthesias persist ? ? ?Follow-up: 4 months ? ?I spent a total of 22 minutes on the date of the service. Discussed treatment options including preventive and acute medications. Discussed medication side effects, adverse reactions and drug interactions. Written educational materials and patient instructions outlining all of the above were given. ? ?Genia Harold, MD ?11/09/21 ?9:16 AM ? ?

## 2021-11-12 NOTE — Patient Instructions (Addendum)
Try taking Nurtec every other day for headache prevention ?

## 2021-11-13 ENCOUNTER — Other Ambulatory Visit (HOSPITAL_COMMUNITY): Payer: Self-pay

## 2021-11-14 ENCOUNTER — Ambulatory Visit
Admission: RE | Admit: 2021-11-14 | Discharge: 2021-11-14 | Disposition: A | Discharge status: Home / Self Care | Payer: 59 | Source: Ambulatory Visit | Attending: Gastroenterology | Admitting: Gastroenterology

## 2021-11-14 ENCOUNTER — Encounter
Admission: RE | Disposition: A | Discharge status: Home / Self Care | Payer: Self-pay | Source: Ambulatory Visit | Attending: Gastroenterology

## 2021-11-14 ENCOUNTER — Ambulatory Visit: Payer: 59 | Admitting: Anesthesiology

## 2021-11-14 ENCOUNTER — Other Ambulatory Visit: Payer: Self-pay

## 2021-11-14 ENCOUNTER — Encounter: Payer: Self-pay | Admitting: Gastroenterology

## 2021-11-14 DIAGNOSIS — K3189 Other diseases of stomach and duodenum: Secondary | ICD-10-CM | POA: Diagnosis not present

## 2021-11-14 DIAGNOSIS — F32A Depression, unspecified: Secondary | ICD-10-CM | POA: Diagnosis not present

## 2021-11-14 DIAGNOSIS — K900 Celiac disease: Secondary | ICD-10-CM | POA: Insufficient documentation

## 2021-11-14 DIAGNOSIS — E039 Hypothyroidism, unspecified: Secondary | ICD-10-CM | POA: Insufficient documentation

## 2021-11-14 DIAGNOSIS — R768 Other specified abnormal immunological findings in serum: Secondary | ICD-10-CM | POA: Diagnosis not present

## 2021-11-14 HISTORY — PX: ESOPHAGOGASTRODUODENOSCOPY (EGD) WITH PROPOFOL: SHX5813

## 2021-11-14 SURGERY — ESOPHAGOGASTRODUODENOSCOPY (EGD) WITH PROPOFOL
Anesthesia: General

## 2021-11-14 MED ORDER — PROPOFOL 500 MG/50ML IV EMUL
INTRAVENOUS | Status: AC
  Filled 2021-11-14: qty 50

## 2021-11-14 MED ORDER — PROPOFOL 10 MG/ML IV BOLUS
INTRAVENOUS | Status: DC | PRN
  Administered 2021-11-14 (×3): 100 mg via INTRAVENOUS

## 2021-11-14 MED ORDER — PROPOFOL 500 MG/50ML IV EMUL
INTRAVENOUS | Status: DC | PRN
  Administered 2021-11-14: 200 ug/kg/min via INTRAVENOUS

## 2021-11-14 MED ORDER — SODIUM CHLORIDE 0.9 % IV SOLN: INTRAVENOUS | Status: DC

## 2021-11-14 NOTE — Transfer of Care (Signed)
Immediate Anesthesia Transfer of Care Note ? ?Patient: Catherine Chang ? ?Procedure(s) Performed: ESOPHAGOGASTRODUODENOSCOPY (EGD) WITH PROPOFOL ? ?Patient Location: PACU ? ?Anesthesia Type:General ? ?Level of Consciousness: sedated ? ?Airway & Oxygen Therapy: Patient Spontanous Breathing and Patient connected to nasal cannula oxygen ? ?Post-op Assessment: Report given to RN and Post -op Vital signs reviewed and stable ? ?Post vital signs: Reviewed and stable ? ?Last Vitals:  ?Vitals Value Taken Time  ?BP 94/61 11/14/21 1238  ?Temp 36.5 ?C 11/14/21 1238  ?Pulse 86 11/14/21 1240  ?Resp 17 11/14/21 1240  ?SpO2 97 % 11/14/21 1240  ?Vitals shown include unvalidated device data. ? ?Last Pain:  ?Vitals:  ? 11/14/21 1238  ?TempSrc: Temporal  ?PainSc: Asleep  ?   ? ?  ? ?Complications: No notable events documented. ?

## 2021-11-14 NOTE — H&P (Signed)
? ? ? ?Jonathon Bellows, MD ?[address] [place], [place], [place], [address] ?[address] [place], [place], [place], [address] ?Phone: [phone]  ?Fax: [phone] ? ?Primary Care Physician:  Lucious Groves, DO ? ? ?Pre-Procedure History & Physical: ?HPI:  Catherine Chang is a 31 y.o. female is here for an endoscopy  ?  ?Past Medical History:  ?Diagnosis Date  ? ADHD   ? Amniotic fluid index decreased 04/03/2020  ? Depression   ? Gestational hypertension 04/11/2020  ? Impaired glucose tolerance in pregnancy 09/04/2017  ? Oligohydramnios antepartum 04/03/2020  ? Status post repeat low transverse cesarean section 04/10/2020  ? ? ?Past Surgical History:  ?Procedure Laterality Date  ? BREAST REDUCTION SURGERY Bilateral 2006  ? BUNIONECTOMY Right 2005  ? [place]  ? CESAREAN SECTION N/A 03/23/2018  ? Procedure: PRIMARY CESAREAN SECTION;  Surgeon: Everett Graff, MD;  Location: [facility];  Service: Obstetrics;  Laterality: N/A;  RNFA Requested  ? CESAREAN SECTION N/A 04/10/2020  ? Procedure: CESAREAN SECTION;  Surgeon: Sanjuana Kava, MD;  Location: [facility];  Service: Obstetrics;  Laterality: N/A;  ? TUBAL LIGATION Bilateral 04/10/2020  ? Procedure: BILATERAL TUBAL LIGATION;  Surgeon: Sanjuana Kava, MD;  Location: [facility];  Service: Obstetrics;  Laterality: Bilateral;  ? ? ?Prior to Admission medications   ?Medication Sig Start Date End Date Taking? Authorizing Provider  ?amphetamine-dextroamphetamine (ADDERALL) 10 MG tablet TAKE 1 TABLET BY MOUTH TWICE DAILY. ?Patient taking differently: Take by mouth 2 (two) times daily. ON HOLD 09/10/21 03/09/22 Yes Lucious Groves, DO  ?amphetamine-dextroamphetamine (ADDERALL) 10 MG tablet TAKE 1 TABLET BY MOUTH TWICE DAILY.  **10/10/2021 ?Patient taking differently: Take by mouth 2 (two) times daily. ON HOLD 10/10/21 04/08/22 Yes Lucious Groves, DO  ?buPROPion (WELLBUTRIN SR) 150 MG 12 hr tablet Take 1 tablet (150 mg total) by mouth daily. 06/20/21 06/20/22 Yes Lucious Groves, DO  ?levothyroxine (SYNTHROID) 50 MCG tablet Take 1 tablet by mouth daily in the morning on an empty stomach 10/27/21  Yes   ?Rimegepant Sulfate (NURTEC) 75 MG TBDP Dissolve 1 tablet by mouth daily as needed for migraine. 06/19/21  Yes Lucious Groves, DO  ?Rimegepant Sulfate (NURTEC) 75 MG TBDP Dissolve 1 tablet by mouth every other day. 11/12/21  Yes Genia Harold, MD  ?traZODone (DESYREL) 50 MG tablet TAKE 1 TABLET BY MOUTH AT BEDTIME AS NEEDED FOR SLEEP 05/01/21 05/01/22 Yes Lucious Groves, DO  ?zolpidem (AMBIEN) 5 MG tablet Take 1 tablet (5 mg total) by mouth at bedtime as needed for sleep. 09/10/21  Yes Lucious Groves, DO  ?fluticasone (FLONASE) 50 MCG/ACT nasal spray Place 1 spray into both nostrils daily as needed for allergies or rhinitis.    [provider]  ? ? ?Allergies as of 11/05/2021  ? (No Known Allergies)  ? ? ?Family History  ?Problem Relation Age of Onset  ? Hypothyroidism Mother   ? Obstructive Sleep Apnea Father   ? Bipolar disorder Brother   ? Obstructive Sleep Apnea Brother   ? Hypothyroidism Maternal Aunt   ? Hypothyroidism Maternal Grandmother   ? Heart disease Maternal Grandfather   ? Obstructive Sleep Apnea Paternal Grandmother   ? Heart disease Paternal Grandfather   ?     sudden cardiac arrest  ? Obstructive Sleep Apnea Paternal Grandfather   ? Cleft palate Daughter   ? ? ?Social History  ? ?Socioeconomic History  ? Marital status: Married  ?  Spouse  name: Keenan Bachelor  ? Number of children: 2  ? Years of education: Not on file  ? Highest education level: Master's degree (e.g., MA, MS, MEng, MEd, MSW, MBA)  ?Occupational History  ? Not on file  ?Tobacco Use  ? Smoking status: Never  ? Smokeless tobacco: Never  ?Vaping Use  ? Vaping Use: Never used  ?Substance and Sexual Activity  ? Alcohol use: Yes  ?  Comment: Occasionallly.  ? Drug use: Never  ? Sexual activity: Yes  ?Other Topics Concern  ? Not on file  ?Social History Narrative  ? Lives at home with husband and 2 children  ?  Right handed caffeine: 10oz cup of coffee in the morning. Occas 2nd  ? ?Social Determinants of Health  ? ?Financial Resource Strain: Not on file  ?Food Insecurity: Not on file  ?Transportation Needs: Not on file  ?Physical Activity: Not on file  ?Stress: Not on file  ?Social Connections: Not on file  ?Intimate Partner Violence: Not on file  ? ? ?Review of Systems: ?See HPI, otherwise negative ROS ? ?Physical Exam: ?BP 123/85   Pulse 71   Temp 97.7 ?F (36.5 ?C) (Temporal)   Resp 15   Ht 5' 2"  (1.575 m)   Wt 65.8 kg   SpO2 100%   BMI 26.52 kg/m?  ?General:   Alert,  pleasant and cooperative in NAD ?Head:  Normocephalic and atraumatic. ?Neck:  Supple; no masses or thyromegaly. ?Lungs:  Clear throughout to auscultation, normal respiratory effort.    ?Heart:  +S1, +S2, Regular rate and rhythm, No edema. ?Abdomen:  Soft, nontender and nondistended. Normal bowel sounds, without guarding, and without rebound.   ?Neurologic:  Alert and  oriented x4;  grossly normal neurologically. ? ?Impression/Plan: ?Catherine Chang is here for an endoscopy  to be performed for  evaluation of celiac disease ?   ?Risks, benefits, limitations, and alternatives regarding endoscopy have been reviewed with the patient.  Questions have been answered.  All parties agreeable. ? ? ?Jonathon Bellows, MD  11/14/2021, 11:41 AM ? ?

## 2021-11-14 NOTE — Anesthesia Preprocedure Evaluation (Signed)
Anesthesia Evaluation  ?Patient identified by MRN, date of birth, ID band ?Patient awake ? ? ? ?Reviewed: ?Allergy & Precautions, NPO status , Patient's Chart, lab work & pertinent test results ? ?Airway ?Mallampati: II ? ?TM Distance: >3 FB ?Neck ROM: Full ? ? ? Dental ?no notable dental hx. ? ?  ?Pulmonary ?neg pulmonary ROS,  ?  ?Pulmonary exam normal ?breath sounds clear to auscultation ? ? ? ? ? ? Cardiovascular ?Exercise Tolerance: Good ?Normal cardiovascular exam ?Rhythm:Regular Rate:Normal ? ? ?  ?Neuro/Psych ?PSYCHIATRIC DISORDERS Depression negative neurological ROS ?   ? GI/Hepatic ?negative GI ROS, Neg liver ROS,   ?Endo/Other  ?Hypothyroidism  ? Renal/GU ?negative Renal ROS  ?negative genitourinary ?  ?Musculoskeletal ?negative musculoskeletal ROS ?(+)  ? Abdominal ?Normal abdominal exam  (+)   ?Peds ? Hematology ?negative hematology ROS ?(+)   ?Anesthesia Other Findings ? ? Reproductive/Obstetrics ?(+) Pregnancy ? ?  ? ? ? ? ? ? ? ? ? ? ? ? ? ?  ?  ? ? ? ? ? ? ? ? ?Anesthesia Physical ? ?Anesthesia Plan ? ?ASA: II ? ?Anesthesia Plan: General  ? ?Post-op Pain Management:   ? ?Induction: Intravenous ? ?PONV Risk Score and Plan: 2 and Scopolamine patch - Pre-op and Ondansetron ? ?Airway Management Planned: Nasal Cannula and Natural Airway ? ?Additional Equipment:  ? ?Intra-op Plan:  ? ?Post-operative Plan:  ? ?Informed Consent: I have reviewed the patients History and Physical, chart, labs and discussed the procedure including the risks, benefits and alternatives for the proposed anesthesia with the patient or authorized representative who has indicated his/her understanding and acceptance.  ? ? ? ? ? ?Plan Discussed with: CRNA and Anesthesiologist ? ?Anesthesia Plan Comments:   ? ? ? ? ? ? ?Anesthesia Quick Evaluation ? ?

## 2021-11-14 NOTE — Op Note (Signed)
[facility] ?Gastroenterology ?Patient Name: Catherine Chang ?Procedure Date: 11/14/2021 12:23 PM ?MRN: [id number] ?Account #: [id number] ?Date of Birth: 02-Nov-1990 ?Admit Type: Outpatient ?Age: 31 ?Room: [facility] ENDO ROOM 3 ?Gender: Female ?Note Status: Finalized ?Instrument Name: Upper-Endoscope [id number] ?Procedure:             Upper GI endoscopy ?Indications:           Positive celiac serologies ?Providers:             Jonathon Bellows MD, MD ?Referring MD:          Forest Gleason Md, MD (Referring MD) ?Medicines:             Monitored Anesthesia Care ?Complications:         No immediate complications. ?Procedure:             Pre-Anesthesia Assessment: ?                       - Prior to the procedure, a History and Physical was  ?                       performed, and patient medications, allergies and  ?                       sensitivities were reviewed. The patient's tolerance  ?                       of previous anesthesia was reviewed. ?                       - The risks and benefits of the procedure and the  ?                       sedation options and risks were discussed with the  ?                       patient. All questions were answered and informed  ?                       consent was obtained. ?                       - ASA Grade Assessment: I - A normal, healthy patient. ?                       After obtaining informed consent, the endoscope was  ?                       passed under direct vision. Throughout the procedure,  ?                       the patient's blood pressure, pulse, and oxygen  ?                       saturations were monitored continuously. The Endoscope  ?                       was introduced through the mouth, and advanced to the  ?  third part of duodenum. The upper GI endoscopy was  ?                       accomplished with ease. The patient tolerated the  ?                       procedure well. ?Findings: ?     The esophagus was normal. ?     The stomach  was normal. ?     Localized atrophic mucosa was found in the duodenal bulb. Biopsies were  ?     taken with a cold forceps for histology. ?     Normal mucosa was found in the second portion of the duodenum and in the  ?     third portion of the duodenum. Biopsies were taken with a cold forceps  ?     for histology. ?     The cardia and gastric fundus were normal on retroflexion. ?Impression:            - Normal esophagus. ?                       - Normal stomach. ?                       - Duodenal mucosal atrophy. Biopsied. ?                       - Normal mucosa was found in the second portion of the  ?                       duodenum and in the third portion of the duodenum.  ?                       Biopsied. ?Recommendation:        - Discharge patient to home (with escort). ?                       - Resume previous diet. ?                       - Continue present medications. ?                       - Return to my office as previously scheduled. ?Procedure Code(s):     --- Professional --- ?                       [phone], Esophagogastroduodenoscopy, flexible,  ?                       transoral; with biopsy, single or multiple ?Diagnosis Code(s):     --- Professional --- ?                       K31.89, Other diseases of stomach and duodenum ?                       R76.8, Other specified abnormal immunological findings  ?                       in serum ?CPT copyright 2019 American Medical Association.  All rights reserved. ?The codes documented in this report are preliminary and upon coder review may  ?be revised to meet current compliance requirements. ?Jonathon Bellows, MD ?Jonathon Bellows MD, MD ?11/14/2021 12:36:51 PM ?This report has been signed electronically. ?Number of Addenda: 0 ?Note Initiated On: 11/14/2021 12:23 PM ?Estimated Blood Loss:  Estimated blood loss: none. ?     [facility] ?

## 2021-11-14 NOTE — Anesthesia Postprocedure Evaluation (Signed)
Anesthesia Post Note ? ?Patient: Catherine Chang ? ?Procedure(s) Performed: ESOPHAGOGASTRODUODENOSCOPY (EGD) WITH PROPOFOL ? ?Patient location during evaluation: PACU ?Anesthesia Type: General ?Level of consciousness: awake and alert ?Pain management: pain level controlled ?Vital Signs Assessment: post-procedure vital signs reviewed and stable ?Respiratory status: spontaneous breathing, nonlabored ventilation and respiratory function stable ?Cardiovascular status: blood pressure returned to baseline and stable ?Postop Assessment: no apparent nausea or vomiting ?Anesthetic complications: no ? ? ?No notable events documented. ? ? ?Last Vitals:  ?Vitals:  ? 11/14/21 1248 11/14/21 1258  ?BP: 103/82 116/79  ?Pulse: 88 78  ?Resp: (!) 22 15  ?Temp:    ?SpO2: 99% 100%  ?  ?Last Pain:  ?Vitals:  ? 11/14/21 1258  ?TempSrc:   ?PainSc: 0-No pain  ? ? ?  ?  ?  ?  ?  ?  ? ?[place] Ouch ? ? ? ? ?

## 2021-11-17 ENCOUNTER — Encounter: Payer: Self-pay | Admitting: Gastroenterology

## 2021-11-17 LAB — SURGICAL PATHOLOGY

## 2021-11-17 NOTE — Progress Notes (Signed)
Catherine Chang I have informed the patient about celiac disease diagnosis- will need virtual of office visit in 6 months please

## 2021-11-20 ENCOUNTER — Telehealth: Payer: Self-pay

## 2021-11-20 NOTE — Telephone Encounter (Signed)
Scheduled visit for patient in 6 months  ?

## 2021-11-20 NOTE — Telephone Encounter (Signed)
-----   Message from Jonathon Bellows, MD sent at 11/17/2021 12:37 PM EDT ----- ?Catherine Chang I have informed the patient about celiac disease diagnosis- will need virtual of office visit in 6 months please ?

## 2021-11-21 ENCOUNTER — Other Ambulatory Visit (HOSPITAL_COMMUNITY): Payer: Self-pay

## 2021-11-22 ENCOUNTER — Other Ambulatory Visit (HOSPITAL_COMMUNITY): Payer: Self-pay

## 2021-12-02 ENCOUNTER — Other Ambulatory Visit (HOSPITAL_COMMUNITY): Payer: Self-pay

## 2021-12-02 ENCOUNTER — Telehealth: Payer: Self-pay | Admitting: Internal Medicine

## 2021-12-02 MED ORDER — METHOCARBAMOL 750 MG PO TABS
1500.0000 mg | ORAL_TABLET | Freq: Three times a day (TID) | ORAL | 1 refills | Status: DC | PRN
  Filled 2021-12-02: qty 60, 10d supply, fill #0
  Filled 2021-12-21: qty 60, 10d supply, fill #1

## 2021-12-02 NOTE — Telephone Encounter (Signed)
Overall feeling much better since dx of celiacs and starting gluten free diet, however has been having some right neck pain with some nerve tingling down her right arm.  In passing I did a brief exam, she does have some paraspinal ropiness and tenderness of right c4-6 area.  Discussed with her likehood that it could be some referred pain from muscle spasm of the area and will try muscle relaxer and NSAID for a few days. ?

## 2021-12-08 ENCOUNTER — Other Ambulatory Visit (HOSPITAL_COMMUNITY): Payer: Self-pay

## 2021-12-09 ENCOUNTER — Other Ambulatory Visit (INDEPENDENT_AMBULATORY_CARE_PROVIDER_SITE_OTHER): Payer: 59

## 2021-12-09 DIAGNOSIS — E039 Hypothyroidism, unspecified: Secondary | ICD-10-CM | POA: Diagnosis not present

## 2021-12-10 LAB — TSH: TSH: 1.37 u[IU]/mL (ref 0.450–4.500)

## 2021-12-15 ENCOUNTER — Other Ambulatory Visit (HOSPITAL_COMMUNITY): Payer: Self-pay

## 2021-12-15 ENCOUNTER — Other Ambulatory Visit: Payer: Self-pay | Admitting: Internal Medicine

## 2021-12-15 MED ORDER — ZOLPIDEM TARTRATE 10 MG PO TABS
10.0000 mg | ORAL_TABLET | Freq: Every evening | ORAL | 2 refills | Status: DC | PRN
  Filled 2021-12-15 – 2021-12-16 (×2): qty 30, 30d supply, fill #0
  Filled 2022-01-14: qty 30, 30d supply, fill #1
  Filled 2022-02-13: qty 30, 30d supply, fill #2

## 2021-12-15 NOTE — Progress Notes (Signed)
Hoyle Sauer requesting refill on ambien and increase in dose to 44m which is more effective when needed.  No longer taking trazadone as it causes excessive sedation.  With her new diagnosis of celiacs and gluten avoidance she has had much less migraines and less trouble with concentration.  She is no longer taking adderall and has stopped wellbutin.  Taking nurtec only as needed. ?

## 2021-12-16 ENCOUNTER — Other Ambulatory Visit (HOSPITAL_COMMUNITY): Payer: Self-pay

## 2021-12-18 ENCOUNTER — Other Ambulatory Visit (HOSPITAL_COMMUNITY): Payer: Self-pay

## 2021-12-19 ENCOUNTER — Other Ambulatory Visit (INDEPENDENT_AMBULATORY_CARE_PROVIDER_SITE_OTHER): Payer: 59

## 2021-12-19 ENCOUNTER — Other Ambulatory Visit: Payer: Self-pay | Admitting: Internal Medicine

## 2021-12-19 DIAGNOSIS — L539 Erythematous condition, unspecified: Secondary | ICD-10-CM

## 2021-12-19 DIAGNOSIS — M791 Myalgia, unspecified site: Secondary | ICD-10-CM

## 2021-12-19 DIAGNOSIS — K900 Celiac disease: Secondary | ICD-10-CM | POA: Diagnosis not present

## 2021-12-19 NOTE — Progress Notes (Signed)
Ndia contacted me today with a new facial rash with centralized erythema.  This is happened to her a few times in the past but previously she has ignored it and it typically resolved.  However currently she is also experiencing generalized myalgias and fatigue myalgias are more prominent in the proximal muscle areas.  Over the past few weeks she has also had right shoulder pain with radicular symptoms that only partially improved with NSAIDs and muscle relaxants.  In addition she had had an episode of right-sided Raynaud's of her fingertips that lasted for few days.   ?She recently has been diagnosed with celiac's in addition to her autoimmune post partum hypothyroidism.  Given this constellation of symptoms we have been somewhat concerned about additional autoimmune diseases.  She has tried to be strictly gluten free and preparing all of her own food, of course there is some risk for cross contamination. ?I will go ahead and order an autoimmune panel for her today and will schedule a visit in person in the coming weeks. ?

## 2021-12-19 NOTE — Addendum Note (Signed)
Addended by: Truddie Crumble on: 12/19/2021 11:39 AM ? ? Modules accepted: Orders ? ?

## 2021-12-19 NOTE — Addendum Note (Signed)
Addended by: Truddie Crumble on: 12/19/2021 11:37 AM ? ? Modules accepted: Orders ? ?

## 2021-12-22 ENCOUNTER — Other Ambulatory Visit (HOSPITAL_COMMUNITY): Payer: Self-pay

## 2021-12-23 LAB — SEDIMENTATION RATE: Sed Rate: 4 mm/hr (ref 0–32)

## 2021-12-23 LAB — ANA+ENA+DNA/DS+SCL 70+SJOSSA/B
ANA Titer 1: NEGATIVE
ENA RNP Ab: <0.2 AI (ref 0.0–0.9)
ENA SM Ab Ser-aCnc: <0.2 AI (ref 0.0–0.9)
ENA SSA (RO) Ab: <0.2 AI (ref 0.0–0.9)
ENA SSB (LA) Ab: <0.2 AI (ref 0.0–0.9)
Scleroderma (Scl-70) (ENA) Antibody, IgG: <0.2 AI (ref 0.0–0.9)
dsDNA Ab: <1 IU/mL (ref 0–9)

## 2021-12-23 LAB — CYCLIC CITRUL PEPTIDE ANTIBODY, IGG/IGA: Cyclic Citrullin Peptide Ab: 4 units (ref 0–19)

## 2021-12-23 LAB — C3 AND C4
Complement C3, Serum: 128 mg/dL (ref 82–167)
Complement C4, Serum: 43 mg/dL — ABNORMAL HIGH (ref 12–38)

## 2021-12-23 LAB — C-REACTIVE PROTEIN: CRP: 8 mg/L (ref 0–10)

## 2021-12-23 LAB — CK: Total CK: 24 U/L — ABNORMAL LOW (ref 32–182)

## 2021-12-23 LAB — RHEUMATOID FACTOR: Rheumatoid fact SerPl-aCnc: <10 IU/mL (ref <14.0)

## 2021-12-26 LAB — ANTI-U1 RNP AB (RDL): Anti-U1 RNP Ab (RDL): <20 Units (ref <20)

## 2021-12-31 ENCOUNTER — Telehealth: Payer: Self-pay | Admitting: Internal Medicine

## 2021-12-31 DIAGNOSIS — M5412 Radiculopathy, cervical region: Secondary | ICD-10-CM

## 2021-12-31 NOTE — Telephone Encounter (Signed)
Catherine Chang reports no improvement in right arm numbness and tingling.  She originally saw dr Daryll Drown for this on march 31st, vitamin deficiency testing was negative.  She has since been taking NSAIDS and robaxin, reports this has helped some back pain but not the radicular symptoms down her arm.   ?I think at this point she has failed conservative treatment over the last 8 weeks.  An MRI is indicated.  Will go ahead and place order.  We have an upcoming visit in 2 weeks. ?

## 2022-01-07 ENCOUNTER — Other Ambulatory Visit: Payer: Self-pay

## 2022-01-08 ENCOUNTER — Ambulatory Visit (INDEPENDENT_AMBULATORY_CARE_PROVIDER_SITE_OTHER): Payer: 59 | Admitting: Internal Medicine

## 2022-01-08 ENCOUNTER — Other Ambulatory Visit: Payer: Self-pay

## 2022-01-08 ENCOUNTER — Other Ambulatory Visit (HOSPITAL_COMMUNITY): Payer: Self-pay

## 2022-01-08 VITALS — BP 111/71 | HR 58 | Temp 98.0°F | Ht 62.0 in | Wt 150.6 lb

## 2022-01-08 DIAGNOSIS — F909 Attention-deficit hyperactivity disorder, unspecified type: Secondary | ICD-10-CM

## 2022-01-08 DIAGNOSIS — M5412 Radiculopathy, cervical region: Secondary | ICD-10-CM | POA: Diagnosis not present

## 2022-01-08 DIAGNOSIS — E039 Hypothyroidism, unspecified: Secondary | ICD-10-CM | POA: Diagnosis not present

## 2022-01-08 DIAGNOSIS — G43109 Migraine with aura, not intractable, without status migrainosus: Secondary | ICD-10-CM | POA: Diagnosis not present

## 2022-01-08 DIAGNOSIS — K900 Celiac disease: Secondary | ICD-10-CM

## 2022-01-08 DIAGNOSIS — G4700 Insomnia, unspecified: Secondary | ICD-10-CM | POA: Diagnosis not present

## 2022-01-08 DIAGNOSIS — Z0001 Encounter for general adult medical examination with abnormal findings: Secondary | ICD-10-CM

## 2022-01-08 DIAGNOSIS — Z1159 Encounter for screening for other viral diseases: Secondary | ICD-10-CM | POA: Diagnosis not present

## 2022-01-08 DIAGNOSIS — E663 Overweight: Secondary | ICD-10-CM

## 2022-01-08 DIAGNOSIS — Z6827 Body mass index (BMI) 27.0-27.9, adult: Secondary | ICD-10-CM

## 2022-01-08 HISTORY — DX: Celiac disease: K90.0

## 2022-01-08 MED ORDER — METHOCARBAMOL 750 MG PO TABS
1500.0000 mg | ORAL_TABLET | Freq: Three times a day (TID) | ORAL | 1 refills | Status: DC | PRN
  Filled 2022-01-08: qty 60, 10d supply, fill #0
  Filled 2022-01-20: qty 60, 10d supply, fill #1

## 2022-01-08 NOTE — Assessment & Plan Note (Signed)
She needs vitamin d level assessed due to celiac disease

## 2022-01-08 NOTE — Assessment & Plan Note (Signed)
Taking Ambien 5 or 27m PRN as needed

## 2022-01-08 NOTE — Progress Notes (Signed)
Annual Wellness Visit     Patient: Catherine Chang, Female    DOB: 07-21-1991, 31 y.o.   MRN: [id number]  Subjective  Chief Complaint  Patient presents with   Follow-up    Feeling better.   Neck Pain    MAGHEN GROUP is a 31 y.o. female who presents today for her Annual Wellness Visit. She reports consuming a  paleo/glutten free  diet. Home exercise routine includes pelaton. She generally feels well. She reports sleeping fairly well/ occasionally uses [place]. She does not have additional problems to discuss today.   Neck Pain    Overall she notes how much better she has been feeling after starting a gluten-free diet and received a diagnosis of celiac's disease.  She has had a couple of accidental contamination's but feels she is doing much better.  For her hypothyroidism she tried going down to 25 mcg dose but constipation returned and she went back up to the 50 mcg dose.  For her right-sided cervical radiculopathy she continues to have pain she did go for massage which did help for a few days before the pain returned.  She is planning to go for additional massage she has an MRI scheduled for Saturday.   Patient Active Problem List   Diagnosis Date Noted   Celiac disease 01/08/2022   Cervical radiculopathy 01/08/2022   Snoring 10/28/2021   Family history of sleep apnea 10/28/2021   UARS (upper airway resistance syndrome) 10/28/2021   Dizzy spells 10/16/2021   Recurrent major depressive disorder, in partial remission ([place]) 06/20/2021   Overweight with body mass index (BMI) of 27 to 27.9 in adult 05/02/2021   Prediabetes 05/02/2021   Osteoarthritis of left knee 05/02/2021   Abnormal uterine bleeding 02/03/2021   Secondary oligomenorrhea 01/03/2021   Hypothyroidism 10/07/2020   Bunion, left foot 10/03/2020   S/P tubal ligation 04/11/2020   History of migraine with aura 04/03/2020   Depression 03/16/2019   ADHD 03/16/2019   Migraine 05/05/2018   Allergic rhinitis  01/19/2017   Insomnia 01/19/2017   Past Medical History:  Diagnosis Date   ADHD    Amniotic fluid index decreased 04/03/2020   Depression    Gestational hypertension 04/11/2020   Impaired glucose tolerance in pregnancy 09/04/2017   Oligohydramnios antepartum 04/03/2020   Status post repeat low transverse cesarean section 04/10/2020      Medications: Outpatient Medications Prior to Visit  Medication Sig   fluticasone (FLONASE) 50 MCG/ACT nasal spray Place 1 spray into both nostrils daily as needed for allergies or rhinitis.   levothyroxine (SYNTHROID) 50 MCG tablet Take 1 tablet by mouth daily in the morning on an empty stomach   Rimegepant Sulfate (NURTEC) 75 MG TBDP Dissolve 1 tablet by mouth daily as needed for migraine.   Rimegepant Sulfate (NURTEC) 75 MG TBDP Dissolve 1 tablet by mouth every other day.   zolpidem (AMBIEN) 10 MG tablet Take 1 tablet by mouth at bedtime as needed for sleep.   [DISCONTINUED] methocarbamol (ROBAXIN-750) 750 MG tablet Take 2 tablets (1,500 mg total) by mouth every 8 (eight) hours as needed for muscle spasms.   No facility-administered medications prior to visit.    No Known Allergies  Patient Care Team: Lucious Groves, DO as PCP - General (Internal Medicine)     Objective  BP 111/71 (BP Location: Right Arm, Patient Position: Sitting, Cuff Size: Normal)   Pulse (!) 58   Temp 98 F (36.7 C) (Oral)   Ht 5' 2" (1.575  m)   Wt 150 lb 9.6 oz (68.3 kg)   SpO2 100% Comment: RA  BMI 27.55 kg/m  BP Readings from Last 3 Encounters:  01/08/22 111/71  11/14/21 116/79  11/12/21 115/68   Wt Readings from Last 3 Encounters:  01/08/22 150 lb 9.6 oz (68.3 kg)  11/14/21 145 lb (65.8 kg)  11/12/21 148 lb (67.1 kg)      Physical Exam Vitals reviewed.  Constitutional:      Appearance: Normal appearance.  Cardiovascular:     Rate and Rhythm: Normal rate and regular rhythm.     Heart sounds: Normal heart sounds.  Pulmonary:     Effort: Pulmonary  effort is normal.  Neurological:     Mental Status: She is alert.     Comments: Spurling testing caused right shoulder pain with left rotation/sidebending.  Tender cervical musculature on right C4-6 paraspinal area.  Psychiatric:        Mood and Affect: Mood normal.        Behavior: Behavior normal.      Most recent functional status assessment:    01/08/2022   10:42 AM  In your present state of health, do you have any difficulty performing the following activities:  Hearing? 0  Vision? 0  Difficulty concentrating or making decisions? 0  Walking or climbing stairs? 0  Dressing or bathing? 0  Doing errands, shopping? 0   Most recent fall risk assessment:    01/08/2022   10:41 AM  Fall Risk   Falls in the past year? 0  Number falls in past yr: 0  Injury with Fall? 0  Risk for fall due to : No Fall Risks  Follow up Falls evaluation completed;Falls prevention discussed    Most recent depression screenings:    01/08/2022   10:42 AM 10/16/2021    8:59 AM  PHQ 2/9 Scores  PHQ - 2 Score 0 0   Most recent cognitive screening:     View : No data to display.         Most recent Audit-C alcohol use screening     View : No data to display.         A score of 3 or more in women, and 4 or more in men indicates increased risk for alcohol abuse, EXCEPT if all of the points are from question 1   Vision/Hearing Screen: No results found.  Last CBC Lab Results  Component Value Date   WBC 7.9 11/04/2021   HGB 12.8 11/04/2021   HCT 38.8 11/04/2021   MCV 91 11/04/2021   MCH 30.0 11/04/2021   RDW 11.9 11/04/2021   PLT 361 55/97/4163   Last metabolic panel Lab Results  Component Value Date   GLUCOSE 100 (H) 09/22/2021   NA 139 09/22/2021   K 4.1 09/22/2021   CL 104 09/22/2021   CO2 20 09/22/2021   BUN 12 09/22/2021   CREATININE 0.85 09/22/2021   EGFR 93 09/22/2021   CALCIUM 8.9 09/22/2021   PROT 6.1 09/22/2021   ALBUMIN 4.3 09/22/2021   LABGLOB 1.8 09/22/2021    AGRATIO 2.4 (H) 09/22/2021   BILITOT 0.3 09/22/2021   ALKPHOS 81 09/22/2021   AST 15 09/22/2021   ALT 14 09/22/2021   ANIONGAP 10 06/24/2021   Last lipids Lab Results  Component Value Date   CHOL 183 05/01/2021   HDL 65 05/01/2021   LDLCALC 108 (H) 05/01/2021   TRIG 54 05/01/2021   CHOLHDL 2.8 05/01/2021   Last hemoglobin  A1c Lab Results  Component Value Date   HGBA1C 5.5 09/22/2021   Last thyroid functions Lab Results  Component Value Date   TSH 1.370 12/09/2021   T3TOTAL 105 10/03/2020      No results found for any visits on 01/08/22.    Assessment & Plan   Annual wellness visit done today including the all of the following: Reviewed patient's Family Medical History Reviewed and updated list of patient's medical providers Assessment of cognitive impairment was done Assessed patient's functional ability Established a written schedule for health screening [place] Completed and Reviewed  Exercise Activities and Dietary recommendations  Goals   None     Immunization History  Administered Date(s) Administered   DTP 07/06/1989, 09/07/1989, 11/10/1989, 10/07/1990, 08/20/1994   Hepatitis B, adult 01/30/2002, 07/03/2002   HiB (PRP-OMP) 07/06/1989, 09/07/1989, 11/10/1989, 10/07/1990   Influenza,inj,quad, With Preservative 04/17/2017   Influenza-Unspecified 07/03/2002   MMR 10/07/1990, 08/20/1994   Meningococcal Conjugate 04/06/2007   OPV 07/06/1989, 09/07/1989, 11/10/1989, 10/07/1990, 08/20/1994   PFIZER(Purple Top)SARS-COV-2 Vaccination 08/07/2019, 08/28/2019, 06/29/2020   Td 01/30/2001   Tdap 04/06/2007, 01/20/2018, 04/11/2020    Health Maintenance  Topic Date Due   Hepatitis C Screening  Never done   COVID-19 Vaccine (4 - Booster for Pfizer series) 08/24/2020   INFLUENZA VACCINE  03/17/2022   PAP SMEAR-Modifier  02/01/2024   TETANUS/TDAP  04/11/2030   HIV Screening  Completed   HPV VACCINES  Aged Out     Discussed health  benefits of physical activity, and encouraged her to engage in regular exercise appropriate for her age and condition.    Problem List Items Addressed This Visit       Cardiovascular and Mediastinum   Migraine (Chronic)    Migraines have improved with gluten free diet       Relevant Medications   methocarbamol (ROBAXIN-750) 750 MG tablet     Digestive   Celiac disease    She needs vitamin d level assessed due to celiac disease       Relevant Orders   Vitamin D (25 hydroxy)     Endocrine   Hypothyroidism - Primary (Chronic)    Tried to decrease to 80mg but constipation returned, will recheck TSH today (goal is weaning off if able) has follow up with Dr KBuddy Dutyin July       Relevant Orders   TSH     Nervous and Auditory   Cervical radiculopathy    Went for massage a few days ago and helped for a few days but pain symptoms returned.  Has MRI scheduled for Saturday.   Refilled robaxan to take PRN. Follow up MRI.       Relevant Medications   methocarbamol (ROBAXIN-750) 750 MG tablet     Other   ADHD (Chronic)    Currently managing well off medications       Overweight with body mass index (BMI) of 27 to 27.9 in adult    Weight up slightly, wondering if it is due to treatment of celiacs       Insomnia    Taking Ambien 5 or 175mPRN as needed       Other Visit Diagnoses     Need for hepatitis C screening test       Relevant Orders   Hepatitis C Ab reflex to Quant PCR       Return in about 1 year (around 01/09/2023).     ErLucious GrovesDO

## 2022-01-08 NOTE — Assessment & Plan Note (Signed)
Currently managing well off medications

## 2022-01-08 NOTE — Assessment & Plan Note (Signed)
Tried to decrease to 70mg but constipation returned, will recheck TSH today (goal is weaning off if able) has follow up with Dr KBuddy Dutyin July

## 2022-01-08 NOTE — Assessment & Plan Note (Signed)
Migraines have improved with gluten free diet

## 2022-01-08 NOTE — Assessment & Plan Note (Signed)
Weight up slightly, wondering if it is due to treatment of celiacs

## 2022-01-08 NOTE — Assessment & Plan Note (Signed)
Went for massage a few days ago and helped for a few days but pain symptoms returned.  Has MRI scheduled for Saturday.   Refilled robaxan to take PRN. Follow up MRI.

## 2022-01-09 ENCOUNTER — Ambulatory Visit
Admission: RE | Admit: 2022-01-09 | Discharge: 2022-01-09 | Disposition: A | Discharge status: Home / Self Care | Payer: 59 | Source: Ambulatory Visit | Attending: Internal Medicine | Admitting: Internal Medicine

## 2022-01-09 DIAGNOSIS — M4722 Other spondylosis with radiculopathy, cervical region: Secondary | ICD-10-CM | POA: Diagnosis not present

## 2022-01-09 DIAGNOSIS — M5010 Cervical disc disorder with radiculopathy, unspecified cervical region: Secondary | ICD-10-CM | POA: Diagnosis not present

## 2022-01-09 DIAGNOSIS — M5412 Radiculopathy, cervical region: Secondary | ICD-10-CM

## 2022-01-09 LAB — HCV AB W REFLEX TO QUANT PCR: HCV Ab: NONREACTIVE

## 2022-01-09 LAB — HCV INTERPRETATION

## 2022-01-09 LAB — VITAMIN D 25 HYDROXY (VIT D DEFICIENCY, FRACTURES): Vit D, 25-Hydroxy: 53.4 ng/mL (ref 30.0–100.0)

## 2022-01-09 LAB — TSH: TSH: 1.35 u[IU]/mL (ref 0.450–4.500)

## 2022-01-09 IMAGING — MR MR CERVICAL SPINE W/O CM
7 series · 42 of 48 positions shown · non-contrast
Comparison: Brain MRI [DATE].

CLINICAL DATA: Provided history: Cervical radiculopathy,
myelopathy, chronic, cervical spine. Additional history provided by
scanning technologist: Radiculopathy, neck pain, right shoulder pain
and weakness, right hand numbness.

EXAM:
MRI CERVICAL SPINE WITHOUT CONTRAST
TECHNIQUE: Multiplanar, multisequence MR imaging of the cervical spine was
performed. No intravenous contrast was administered.

[Series 3: T2 · sagittal · 3.0mm · 0.41mm/px · 6 of 15 slices shown (1 of 3)]
[im 1/15]
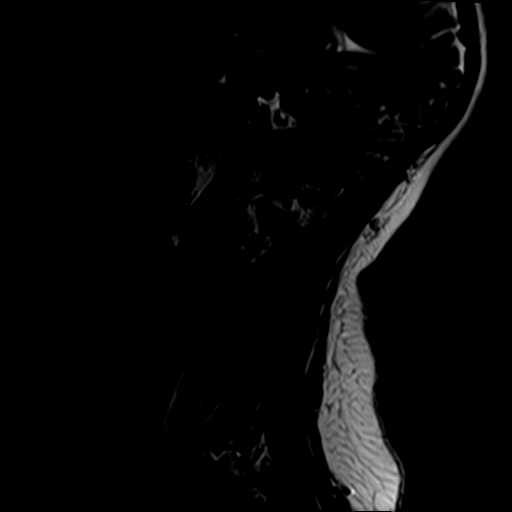
[im 3/15]
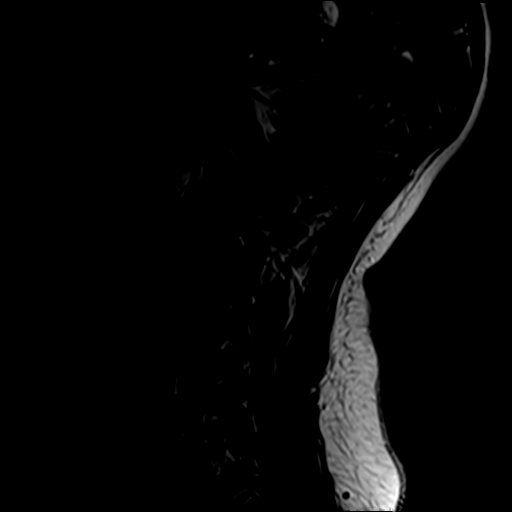
[im 6/15]
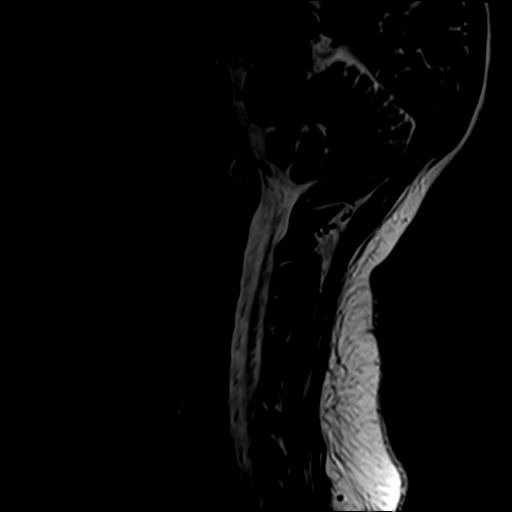
[im 9/15]
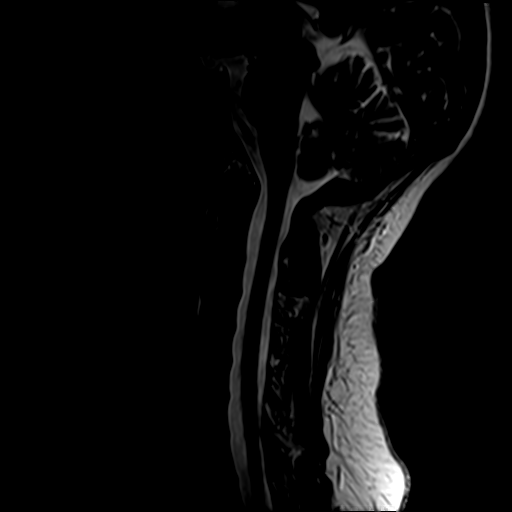
[im 12/15]
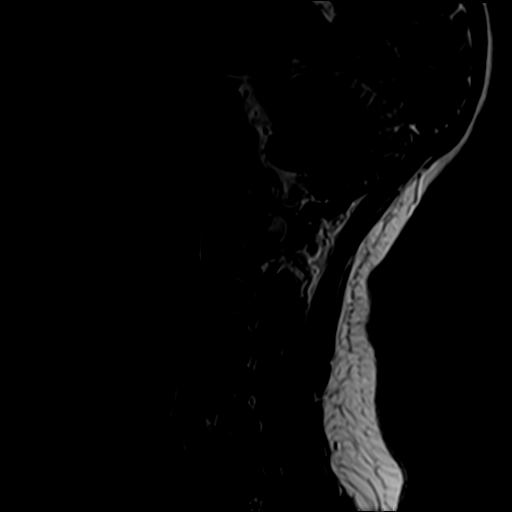
[im 15/15]
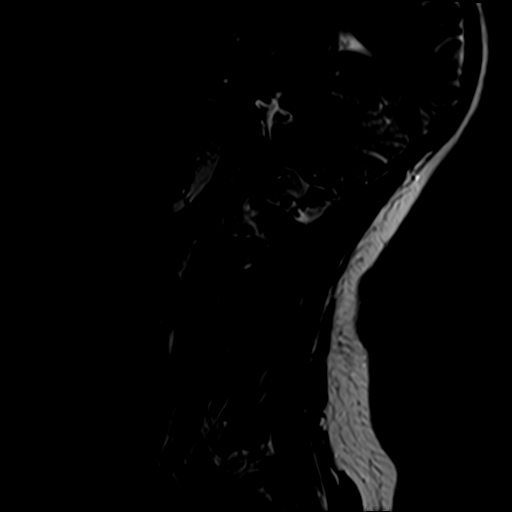

[Series 4: STIR · sagittal · 3.0mm · 0.82mm/px · 5 of 15 slices shown (1 of 2)]
[im 1/15]
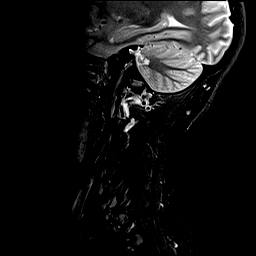
[im 4/15]
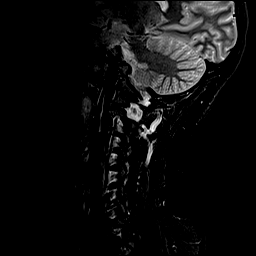
[im 8/15]
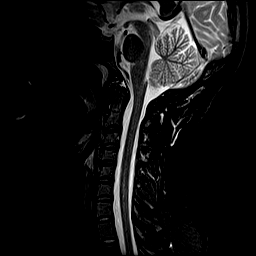
[im 11/15]
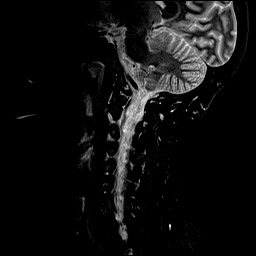
[im 15/15]
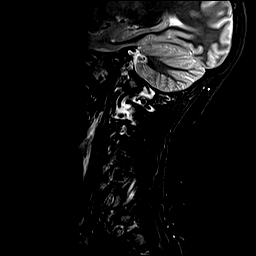

[Series 5: T1 · sagittal · 3.0mm · 0.82mm/px · 5 of 15 slices shown]
[im 1/15]
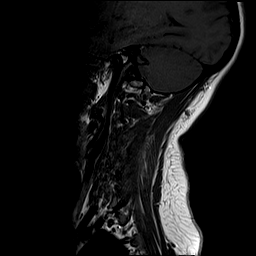
[im 4/15]
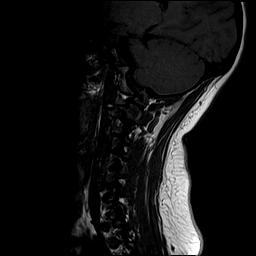
[im 8/15]
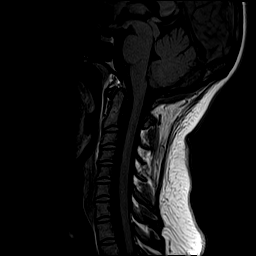
[im 11/15]
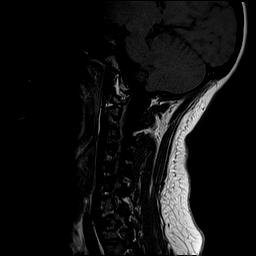
[im 15/15]
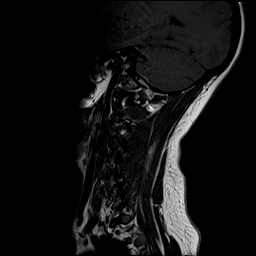

[Series 6: T2 · axial · 3.0mm · 0.70mm/px · z∈[-78,+13]mm · 9 of 26 slices shown (2 of 3)]
[im 1/26]
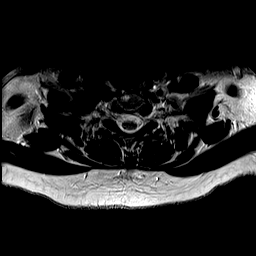
[im 4/26]
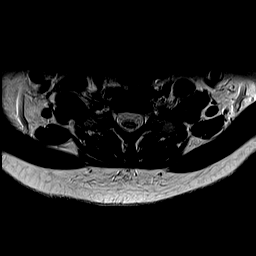
[im 7/26]
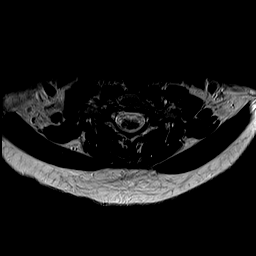
[im 10/26]
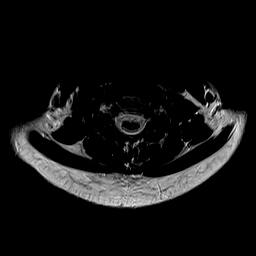
[im 13/26]
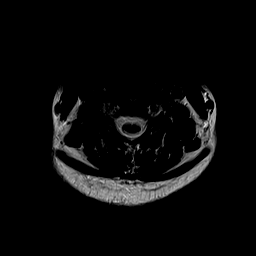
[im 16/26]
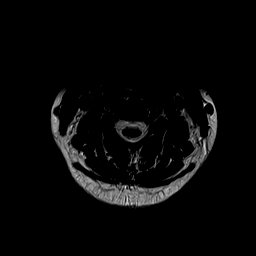
[im 19/26]
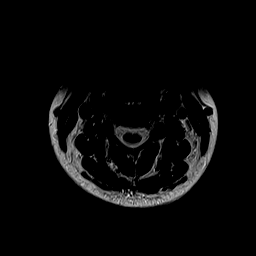
[im 22/26]
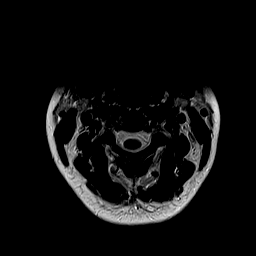
[im 26/26]
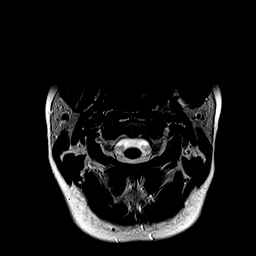

[Series 7: GRE · axial · 3.0mm · 0.35mm/px · z∈[-78,-56]mm · 3 of 26 slices shown]
[im 1/26]
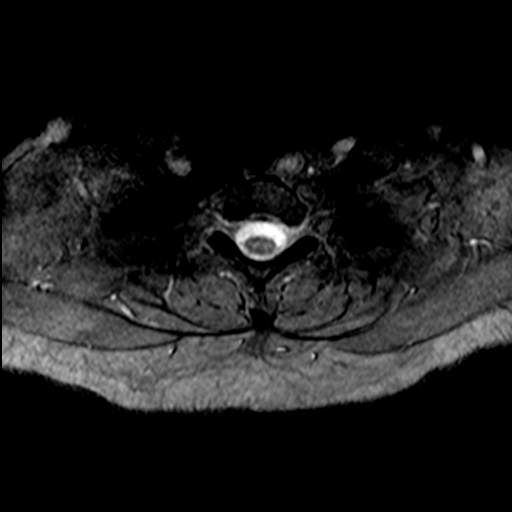
[im 4/26]
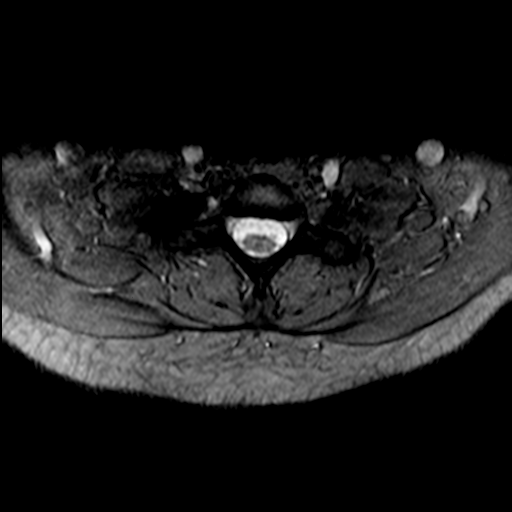
[im 7/26]
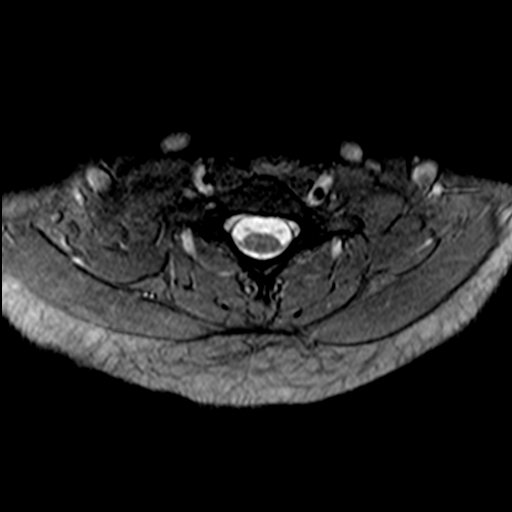

[Series 8: STIR · sagittal · 3.0mm · 0.82mm/px · 5 of 15 slices shown (2 of 2)]
[im 1/15]
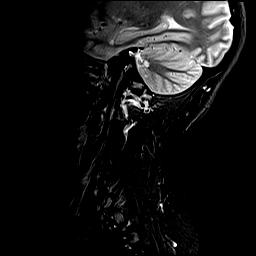
[im 4/15]
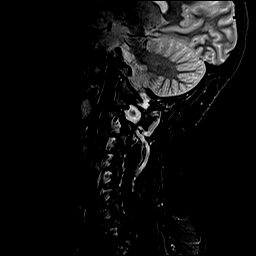
[im 8/15]
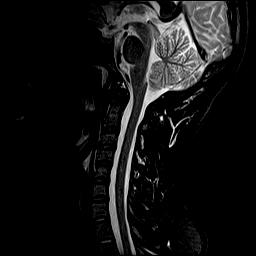
[im 11/15]
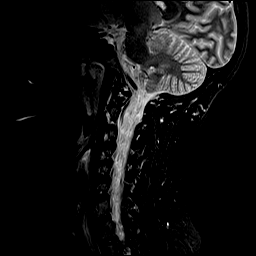
[im 15/15]
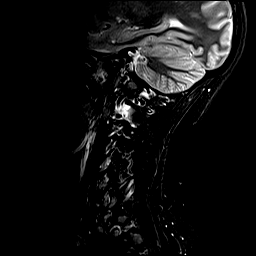

[Series 9: T2 · axial · 3.0mm · 0.70mm/px · z∈[-78,+13]mm · 9 of 26 slices shown (3 of 3)]
[im 1/26]
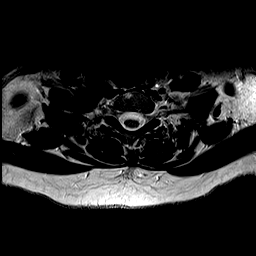
[im 4/26]
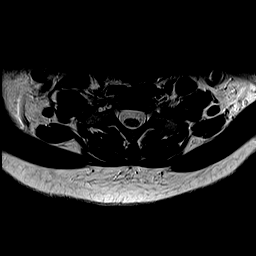
[im 7/26]
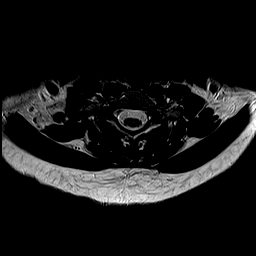
[im 10/26]
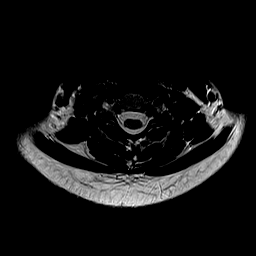
[im 13/26]
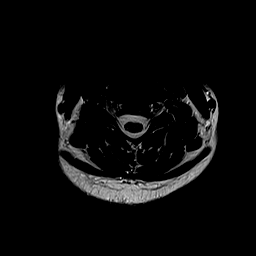
[im 16/26]
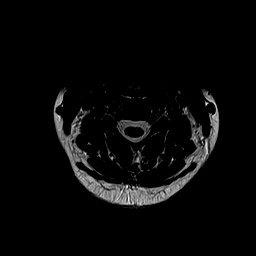
[im 19/26]
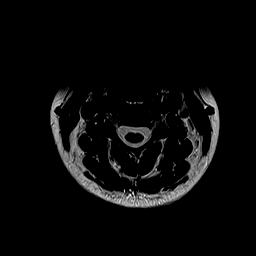
[im 22/26]
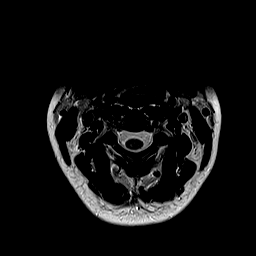
[im 26/26]
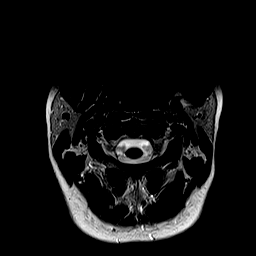

[42 of 48 positions shown; findings below may reference images not displayed]

FINDINGS: Alignment: Straightening of the expected cervical lordosis. No
significant spondylolisthesis.

Vertebrae: Vertebral body height is maintained. No significant
marrow edema or focal suspicious osseous lesion. Small hemangioma
within the T1 vertebral body.

Cord: No signal abnormality identified within the cervical spinal
cord.

Posterior Fossa, vertebral arteries, paraspinal tissues: No
abnormality identified within included portions of the posterior
fossa. Flow voids preserved within the imaged cervical vertebral
arteries. No paraspinal mass or collection.

Disc levels:

No more than mild disc degeneration. Slight disc bulges are present
throughout the cervical and visualized upper thoracic spine. No
significant spinal canal or foraminal stenosis.
IMPRESSION: Mild cervical spondylosis. No more than mild disc degeneration.
Slight disc bulges throughout the cervical and visualized upper
thoracic spine. No significant spinal canal or foraminal stenosis.

Nonspecific straightening of the expected cervical lordosis.

## 2022-01-10 ENCOUNTER — Other Ambulatory Visit: Payer: 59

## 2022-01-14 ENCOUNTER — Other Ambulatory Visit (HOSPITAL_COMMUNITY): Payer: Self-pay

## 2022-01-20 ENCOUNTER — Other Ambulatory Visit (HOSPITAL_COMMUNITY): Payer: Self-pay

## 2022-01-26 ENCOUNTER — Other Ambulatory Visit (HOSPITAL_COMMUNITY): Payer: Self-pay

## 2022-01-29 ENCOUNTER — Encounter: Payer: 59 | Admitting: Internal Medicine

## 2022-02-03 ENCOUNTER — Other Ambulatory Visit (HOSPITAL_COMMUNITY): Payer: Self-pay

## 2022-02-03 ENCOUNTER — Telehealth: Payer: Self-pay | Admitting: Internal Medicine

## 2022-02-03 MED ORDER — SERTRALINE HCL 50 MG PO TABS
ORAL_TABLET | ORAL | 2 refills | Status: DC
Start: 2022-02-03 — End: 2022-05-05
  Filled 2022-02-03: qty 30, 30d supply, fill #0
  Filled 2022-03-04: qty 30, 30d supply, fill #1
  Filled 2022-04-01: qty 30, 30d supply, fill #2

## 2022-02-03 NOTE — Telephone Encounter (Signed)
I spoke with Catherine Chang today she reports that she has some increased anxiety over the last several weeks.  She has had multiple new stressors in her life notably major life changes associated with the celiac's diagnosis.  She notes some stress with her marriage but that is in a good place and she is working through things.  She reports historically depression was more of an issue than anxiety.  Years ago she tried Lexapro which was helpful for both depression and anxiety however experienced sexual side effects and therefore settled on Wellbutrin for depression.  Recently she has weaned off of Wellbutrin and was doing well she actually had some Wellbutrin and try to go back on it but reported that it made her anxiety worse.  She is interested in starting an SSRI.  We discussed a trial of Zoloft.

## 2022-02-13 ENCOUNTER — Other Ambulatory Visit (HOSPITAL_COMMUNITY): Payer: Self-pay

## 2022-02-25 ENCOUNTER — Other Ambulatory Visit (HOSPITAL_COMMUNITY): Payer: Self-pay

## 2022-03-03 ENCOUNTER — Other Ambulatory Visit (HOSPITAL_COMMUNITY): Payer: Self-pay

## 2022-03-03 ENCOUNTER — Telehealth: Payer: Self-pay | Admitting: Internal Medicine

## 2022-03-03 MED ORDER — ZOLPIDEM TARTRATE 10 MG PO TABS
10.0000 mg | ORAL_TABLET | Freq: Every evening | ORAL | 4 refills | Status: DC | PRN
  Filled 2022-03-03 – 2022-03-14 (×2): qty 30, 30d supply, fill #0
  Filled 2022-04-14: qty 30, 30d supply, fill #1
  Filled 2022-05-14: qty 30, 30d supply, fill #2
  Filled 2022-06-12: qty 30, 30d supply, fill #3
  Filled 2022-07-13: qty 30, 30d supply, fill #4

## 2022-03-03 MED ORDER — METHOCARBAMOL 750 MG PO TABS
1500.0000 mg | ORAL_TABLET | Freq: Three times a day (TID) | ORAL | 2 refills | Status: DC | PRN
  Filled 2022-03-03: qty 60, 10d supply, fill #0
  Filled 2022-03-27: qty 60, 10d supply, fill #1
  Filled 2022-05-29: qty 60, 10d supply, fill #2

## 2022-03-03 NOTE — Telephone Encounter (Signed)
Ambien continues to help for sleep. Not taking every night but needs more often than not.  Neck pain much improved with robaxin, working out more but requesting refill to have as needed.

## 2022-03-04 ENCOUNTER — Other Ambulatory Visit (HOSPITAL_COMMUNITY): Payer: Self-pay

## 2022-03-14 ENCOUNTER — Other Ambulatory Visit (HOSPITAL_COMMUNITY): Payer: Self-pay

## 2022-03-19 ENCOUNTER — Other Ambulatory Visit (HOSPITAL_COMMUNITY): Payer: Self-pay

## 2022-03-19 ENCOUNTER — Telehealth: Payer: Self-pay | Admitting: Internal Medicine

## 2022-03-19 DIAGNOSIS — E039 Hypothyroidism, unspecified: Secondary | ICD-10-CM | POA: Diagnosis not present

## 2022-03-19 DIAGNOSIS — K900 Celiac disease: Secondary | ICD-10-CM | POA: Diagnosis not present

## 2022-03-19 DIAGNOSIS — O905 Postpartum thyroiditis: Secondary | ICD-10-CM | POA: Diagnosis not present

## 2022-03-19 MED ORDER — LEVOTHYROXINE SODIUM 25 MCG PO TABS
ORAL_TABLET | ORAL | 1 refills | Status: DC
Start: 2022-03-19 — End: 2022-05-25
  Filled 2022-03-19: qty 90, 90d supply, fill #0

## 2022-03-19 MED ORDER — AMOXICILLIN-POT CLAVULANATE 875-125 MG PO TABS
1.0000 | ORAL_TABLET | Freq: Two times a day (BID) | ORAL | 0 refills | Status: DC
  Filled 2022-03-19: qty 14, 7d supply, fill #0

## 2022-03-19 NOTE — Telephone Encounter (Signed)
Catherine Chang has had a cold and sinus congestion for over a week. Has been trying routine care but still has sinus pain, drainage, sore throat and low grade fevers, COVID negative.    Will send in course of augmentin.

## 2022-03-20 ENCOUNTER — Other Ambulatory Visit: Payer: Self-pay | Admitting: Internal Medicine

## 2022-03-20 NOTE — Progress Notes (Signed)
Still having cough, attempted to prescribe tussionex however EMR not allowing have placed called to help desk

## 2022-03-21 ENCOUNTER — Other Ambulatory Visit (HOSPITAL_COMMUNITY): Payer: Self-pay

## 2022-03-21 MED ORDER — HYDROCOD POLI-CHLORPHE POLI ER 10-8 MG/5ML PO SUER
5.0000 mL | Freq: Two times a day (BID) | ORAL | 0 refills | Status: DC | PRN
  Filled 2022-03-21: qty 120, 12d supply, fill #0

## 2022-03-21 NOTE — Addendum Note (Signed)
Addended by: Lucious Groves on: 03/21/2022 09:12 AM   Modules accepted: Orders

## 2022-03-27 ENCOUNTER — Other Ambulatory Visit (HOSPITAL_COMMUNITY): Payer: Self-pay

## 2022-03-28 ENCOUNTER — Other Ambulatory Visit (HOSPITAL_COMMUNITY): Payer: Self-pay

## 2022-04-01 ENCOUNTER — Other Ambulatory Visit (HOSPITAL_COMMUNITY): Payer: Self-pay

## 2022-04-01 ENCOUNTER — Other Ambulatory Visit: Payer: Self-pay | Admitting: Psychiatry

## 2022-04-01 MED ORDER — NURTEC 75 MG PO TBDP
75.0000 mg | ORAL_TABLET | ORAL | 3 refills | Status: DC
  Filled 2022-04-01: qty 16, 30d supply, fill #0
  Filled 2022-05-04: qty 16, 30d supply, fill #1

## 2022-04-08 ENCOUNTER — Ambulatory Visit: Payer: 59 | Admitting: Psychiatry

## 2022-04-08 VITALS — BP 124/72 | HR 62 | Ht 62.0 in | Wt 154.1 lb

## 2022-04-08 DIAGNOSIS — G43109 Migraine with aura, not intractable, without status migrainosus: Secondary | ICD-10-CM | POA: Diagnosis not present

## 2022-04-08 NOTE — Progress Notes (Signed)
   CC:  headaches  Follow-up Visit  Last visit: 11/12/21  Brief HPI: 31 year old female with a history of Celiac disease, hypothyroidism, ADHD, depression who follows in clinic for migraines with aura. MRI brain 10/31/21 was unremarkable. MRI C-spine 01/09/22 showed mild cervical spondylosis without significant canal or foraminal stenosis.  At her last visit Nurtec was started every other day for headache prevention.  Interval History: She was recently diagnosed with Celiac disease and has stopped gluten. This has helped reduce her migraine frequency. Now she has having 1-2 migraines per week. She is now back to taking Nurtec as needed and feels this is keeping her migraines under relatively good control.  Continues to have paresthesias in her RUE but thinks these may be associated with her migraines. Now that migraines have decreased in frequency it has been easier for her to identify a correlation between the headaches and paresthesias.    Headache days per month: 8 Headache free days per month: 22  Current Headache Regimen: Preventative: none Abortive: Nurtec 75 mg PRN   Prior Therapies                                  Nurtec 75 mg PRN Propranolol 20 mg BID - side effects    Physical Exam:   Vital Signs: BP 124/72   Pulse 62   Ht 5' 2"  (1.575 m)   Wt 154 lb 2 oz (69.9 kg)   BMI 28.19 kg/m  GENERAL:  well appearing, in no acute distress, alert  SKIN:  Color, texture, turgor normal. No rashes or lesions HEAD:  Normocephalic/atraumatic. RESP: normal respiratory effort MSK:  No gross joint deformities.   NEUROLOGICAL: Mental Status: Alert, oriented to person, place and time, Follows commands, and Speech fluent and appropriate. Cranial Nerves: PERRL, face symmetric, no dysarthria, hearing grossly intact Motor: moves all extremities equally Gait: normal-based.  IMPRESSION: 31 year old female with a history of Celiac disease, hypothyroidism, ADHD, depression who presents  for follow up of migraines and paresthesias. Her migraines have improved since eliminating gluten from her diet. She is happy with her current headache control on PRN Nurtec. She will continue to track her paresthesias to help determine if these are associated with her migraines.  PLAN: -Continue Nurtec 75 mg PRN   Follow-up: 6-7 months  I spent a total of 13 minutes on the date of the service. Headache education was done.  Discussed medication side effects, adverse reactions and drug interactions. Written educational materials and patient instructions outlining all of the above were given.  Genia Harold, MD 04/08/22 9:14 AM

## 2022-04-11 ENCOUNTER — Other Ambulatory Visit (HOSPITAL_COMMUNITY): Payer: Self-pay

## 2022-04-11 ENCOUNTER — Telehealth: Payer: Self-pay | Admitting: Internal Medicine

## 2022-04-11 MED ORDER — ONDANSETRON HCL 4 MG PO TABS
4.0000 mg | ORAL_TABLET | Freq: Three times a day (TID) | ORAL | 1 refills | Status: DC | PRN
  Filled 2022-04-11: qty 30, 10d supply, fill #0

## 2022-04-11 MED ORDER — AMPHETAMINE-DEXTROAMPHETAMINE 10 MG PO TABS
10.0000 mg | ORAL_TABLET | Freq: Two times a day (BID) | ORAL | 0 refills | Status: DC
  Filled 2022-04-11: qty 60, 30d supply, fill #0

## 2022-04-11 NOTE — Telephone Encounter (Signed)
Experencing nausea, feels she has a gi bug (whole family does) requesting zofran. Also requesting refill of adderall due to decreased concentration lately

## 2022-04-13 ENCOUNTER — Other Ambulatory Visit (HOSPITAL_COMMUNITY): Payer: Self-pay

## 2022-04-14 ENCOUNTER — Other Ambulatory Visit (HOSPITAL_COMMUNITY): Payer: Self-pay

## 2022-04-16 ENCOUNTER — Other Ambulatory Visit: Payer: 59

## 2022-04-16 DIAGNOSIS — E039 Hypothyroidism, unspecified: Secondary | ICD-10-CM | POA: Diagnosis not present

## 2022-04-16 NOTE — Progress Notes (Signed)
Patient due for labs.  Placed orders for Dr. Heber Lolita.  Gilles Chiquito, MD

## 2022-04-17 LAB — TISSUE TRANSGLUTAMINASE ABS,IGG,IGA
Tissue Transglut Ab: 7 U/mL — ABNORMAL HIGH (ref 0–5)
Transglutaminase IgA: 18 U/mL — ABNORMAL HIGH (ref 0–3)

## 2022-04-17 LAB — TSH: TSH: 1.42 u[IU]/mL (ref 0.450–4.500)

## 2022-04-21 ENCOUNTER — Encounter: Payer: Self-pay | Admitting: Gastroenterology

## 2022-05-04 ENCOUNTER — Other Ambulatory Visit (HOSPITAL_COMMUNITY): Payer: Self-pay

## 2022-05-04 ENCOUNTER — Other Ambulatory Visit: Payer: Self-pay

## 2022-05-05 ENCOUNTER — Other Ambulatory Visit: Payer: Self-pay | Admitting: Internal Medicine

## 2022-05-05 ENCOUNTER — Other Ambulatory Visit (HOSPITAL_COMMUNITY): Payer: Self-pay

## 2022-05-05 MED ORDER — SERTRALINE HCL 50 MG PO TABS
50.0000 mg | ORAL_TABLET | Freq: Every day | ORAL | 3 refills | Status: DC
  Filled 2022-05-05: qty 90, 90d supply, fill #0
  Filled 2022-07-29: qty 90, 90d supply, fill #1
  Filled 2022-10-27: qty 90, 90d supply, fill #2
  Filled 2023-02-02: qty 90, 90d supply, fill #3

## 2022-05-08 ENCOUNTER — Encounter: Payer: Self-pay | Admitting: Internal Medicine

## 2022-05-08 ENCOUNTER — Ambulatory Visit (INDEPENDENT_AMBULATORY_CARE_PROVIDER_SITE_OTHER): Payer: 59 | Admitting: Internal Medicine

## 2022-05-08 ENCOUNTER — Other Ambulatory Visit: Payer: Self-pay

## 2022-05-08 VITALS — BP 126/76 | HR 60 | Temp 97.7°F | Ht 62.0 in | Wt 150.9 lb

## 2022-05-08 DIAGNOSIS — N6452 Nipple discharge: Secondary | ICD-10-CM | POA: Diagnosis not present

## 2022-05-08 NOTE — Addendum Note (Signed)
Addended by: Gilles Chiquito B on: 05/08/2022 11:29 AM   Modules accepted: Orders

## 2022-05-08 NOTE — Patient Instructions (Signed)
Thanks for coming in today!  Please schedule a mammogram with ultrasound when you are able!  Thank you!

## 2022-05-08 NOTE — Progress Notes (Signed)
Established Patient Office Visit  Subjective   Patient ID: Catherine Chang, female    DOB: 04-Aug-1991  Age: 31 y.o. MRN: [id number]  Chief Complaint  Patient presents with   bloody nipple discharge    Catherine Chang is a 31 year old woman with celiac disease, hypothyroidism who presents for spontaneous bloody nipple discharge on the left.  This has now ceased, small clot noted.  She also was able to express a drop of white discharge from the right breast.  No palpable mass, no trauma.  She is a non smoker.  She has no FH of breast cancer.   We also discussed 2 new lesions on her legs.  These are hard, nodular and slowly growing, slightly darker in color than skin tone.  No variation in color.     Review of Systems  Constitutional:  Negative for chills and fever.  Gastrointestinal:  Positive for diarrhea (off and on).  Genitourinary:        + bloody nipple discharge, no mass or other cyst.  Mild pain at the site of previous blood       Objective:     BP 126/76 (BP Location: Right Arm, Patient Position: Sitting, Cuff Size: Normal)   Pulse 60   Temp 97.7 F (36.5 C) (Oral)   Ht 5' 2"  (1.575 m)   Wt 150 lb 14.4 oz (68.4 kg)   SpO2 100% Comment: RA  BMI 27.60 kg/m     Physical Exam Constitutional:      General: She is not in acute distress.    Appearance: Normal appearance. She is not toxic-appearing.  Cardiovascular:     Rate and Rhythm: Normal rate and regular rhythm.  Pulmonary:     Effort: Pulmonary effort is normal. No respiratory distress.     Breath sounds: Normal breath sounds.  Chest:     Chest wall: No mass, lacerations or deformity.  Breasts:    Breasts are symmetrical.     Right: No swelling, bleeding, inverted nipple, mass, nipple discharge, skin change or tenderness.     Left: Bleeding (picture sent by patient, she has a small clot at the duct where bleeding occurred previously) present. No swelling, inverted nipple, mass, nipple discharge, skin change or  tenderness.  Lymphadenopathy:     Upper Body:     Right upper body: No axillary adenopathy.     Left upper body: No axillary adenopathy.  Skin:    General: Skin is warm and dry.     Comments: She has 2 small dermatofibromas with dimpling at the site.    Neurological:     Mental Status: She is alert.    We will check prolactin, HCG, TFTs, BMET today.   Will also order MMG and Ultrasound on the left.  Screening on the right.     Assessment & Plan:   Problem List Items Addressed This Visit       Unprioritized   Bloody discharge from left nipple - Primary    Spontaneous, non traumatic.  Most consistent with benign papilloma, but given the finding, cannot rule out other causes.   Will check for causes related to the pituitary gland including HCG, PRL, TFTs.  Will also check renal function.   Mammogram and ultrasound ordered for diagnostic purposes.    Further plan based on work up above.       Relevant Orders   MM Digital Diagnostic Unilat L   MM Digital Screening Unilat R   US BREAST  LTD UNI LEFT INC AXILLA   Prolactin   TSH   T4, Free   T3   BMP8+Anion Gap   Pregnancy Test, Serum, Qual    No follow-ups on file.    Gilles Chiquito, MD

## 2022-05-08 NOTE — Assessment & Plan Note (Signed)
Spontaneous, non traumatic.  Most consistent with benign papilloma, but given the finding, cannot rule out other causes.   Will check for causes related to the pituitary gland including HCG, PRL, TFTs.  Will also check renal function.   Mammogram and ultrasound ordered for diagnostic purposes.    Further plan based on work up above.

## 2022-05-09 LAB — CMP14 + ANION GAP
ALT: 14 IU/L (ref 0–32)
AST: 16 IU/L (ref 0–40)
Albumin/Globulin Ratio: 2.4 — ABNORMAL HIGH (ref 1.2–2.2)
Albumin: 4.7 g/dL (ref 3.9–4.9)
Alkaline Phosphatase: 72 IU/L (ref 44–121)
Anion Gap: 17 mmol/L (ref 10.0–18.0)
BUN/Creatinine Ratio: 20 (ref 9–23)
BUN: 15 mg/dL (ref 6–20)
Bilirubin Total: <0.2 mg/dL (ref 0.0–1.2)
CO2: 21 mmol/L (ref 20–29)
Calcium: 9.3 mg/dL (ref 8.7–10.2)
Chloride: 101 mmol/L (ref 96–106)
Creatinine, Ser: 0.75 mg/dL (ref 0.57–1.00)
Globulin, Total: 2 g/dL (ref 1.5–4.5)
Glucose: 91 mg/dL (ref 70–99)
Potassium: 3.8 mmol/L (ref 3.5–5.2)
Sodium: 139 mmol/L (ref 134–144)
Total Protein: 6.7 g/dL (ref 6.0–8.5)
eGFR: 108 mL/min/{1.73_m2} (ref >59)

## 2022-05-09 LAB — T4, FREE: Free T4: 1.11 ng/dL (ref 0.82–1.77)

## 2022-05-09 LAB — HCG, SERUM, QUALITATIVE: hCG,Beta Subunit,Qual,Serum: NEGATIVE m[IU]/mL (ref <6)

## 2022-05-09 LAB — PROLACTIN: Prolactin: 10 ng/mL (ref 4.8–23.3)

## 2022-05-09 LAB — T3: T3, Total: 88 ng/dL (ref 71–180)

## 2022-05-09 LAB — TSH: TSH: 3.17 u[IU]/mL (ref 0.450–4.500)

## 2022-05-12 NOTE — Telephone Encounter (Signed)
Good morning  Please do not apologize for contacting.  How long have you been having diarrhea.  Pulmonary times a day, any blood in it, any use of artificial sugars or sweeteners, any diet soda or sweet tea or chewing gum being used.  Reviewed your labs CMP looks fine I do not believe that the nipple discharge is related to the celiac disease.  In terms of the diarrhea this is what type plan we should do  1.  Lets rule out infection in the stool with stool tests, check for mucosal inflammation with fecal calprotectin  2.  Lets also recheck the tissue transglutaminase antibody and check how high it is presently.  3.  Celiac disease has some association with inflammatory bowel disease, microscopic colitis as well and based on these results we will do a video visit right after I get the results to discuss the neck steps depending on how you are feeling.  Herb Grays can you please order the above tests.  Please do not hesitate to contact me at any point

## 2022-05-14 ENCOUNTER — Other Ambulatory Visit (HOSPITAL_COMMUNITY): Payer: Self-pay

## 2022-05-25 ENCOUNTER — Telehealth: Payer: 59 | Admitting: Gastroenterology

## 2022-05-25 ENCOUNTER — Other Ambulatory Visit: Payer: Self-pay

## 2022-05-25 DIAGNOSIS — K900 Celiac disease: Secondary | ICD-10-CM

## 2022-05-25 DIAGNOSIS — O321XX0 Maternal care for breech presentation, not applicable or unspecified: Secondary | ICD-10-CM | POA: Insufficient documentation

## 2022-05-25 NOTE — Progress Notes (Unsigned)
Catherine Chang , MD [address]  [place]  Catherine Chang, [place] [address]  Main: [phone]  Fax: [phone]   Primary Care Physician: Catherine Groves, DO  Virtual Visit via Video Note  I connected with patient on 05/25/22 at  3:15 PM EDT by video and verified that I am speaking with the correct person using two identifiers.   I discussed the limitations, risks, security and privacy concerns of performing an evaluation and management service by video  and the availability of in person appointments. I also discussed with the patient that there may be a patient responsible charge related to this service. The patient expressed understanding and agreed to proceed.  Location of Patient: Home Location of Provider: Home Persons involved: Patient and provider only   History of Present Illness: Chief Complaint  Patient presents with   Celiac Disease    HPI: Catherine Chang is a 32 y.o. female   Summary of history :   She was diagnosed with celiac disease in 10/2021, TTG IgG was 72. Duodenal biopsies showed complete villous atrophy.  No known family history of celiac disease but mother had GI issues.  She herself has not been diagnosed with GI issues but has had a history of constipation.  Some [place] ancestry.  She has had 2 C-sections.  History of hypothyroidism  11/04/2021 hemoglobin 12.8 g MCV of 91.,Year back was found to have hypothyroidism.   Interval history   11/03/2021-05/25/2022  11/04/2021: B12 normal, vitamin D B6, B3, B2, B1 normal  Recent phone call with new GI symptoms and right upper quadrant pain recent TTG IgA elevated at 18 despite being on a gluten-free diet At that time she was also having diarrhea.  She subsequently cut out all oral products and her GI symptoms have significantly improved her only symptom is nonspecific right upper quadrant pain on and off which has been going on even prior to the diagnosis of celiac disease.  Current Outpatient  Medications  Medication Sig Dispense Refill   amphetamine-dextroamphetamine (ADDERALL) 10 MG tablet Take 1 tablet by mouth in the morning and at bedtime.     buPROPion (WELLBUTRIN XL) 300 MG 24 hr tablet Take 1 tablet by mouth daily.     fluticasone (FLONASE) 50 MCG/ACT nasal spray Place 1 spray into both nostrils daily as needed for allergies or rhinitis.     levothyroxine (SYNTHROID) 75 MCG tablet Take 1 tablet by mouth daily.     methocarbamol (ROBAXIN-750) 750 MG tablet Take 2 tablets by mouth every 8 hours as needed for muscle spasms. 60 tablet 2   Rimegepant Sulfate (NURTEC) 75 MG TBDP Dissolve 1 tablet by mouth daily as needed for migraine. 16 tablet 5   sertraline (ZOLOFT) 50 MG tablet Take 1 tablet (50 mg total) by mouth daily. 90 tablet 3   traZODone (DESYREL) 50 MG tablet Take 1 tablet by mouth at bedtime as needed.     zolpidem (AMBIEN) 10 MG tablet Take 1 tablet by mouth at bedtime as needed for sleep. 30 tablet 4   No current facility-administered medications for this visit.    Allergies as of 05/25/2022 - Review Complete 05/25/2022  Allergen Reaction Noted   Wound dressing adhesive Hives 05/25/2022    Review of Systems:    All systems reviewed and negative except where noted in HPI.  General Appearance:    Alert, cooperative, no distress, appears stated age  Head:    Normocephalic, without obvious abnormality, atraumatic  Eyes:  PERRL, conjunctiva/corneas clear,  Ears:    Grossly normal hearing    Neurologic:  Grossly normal    Observations/Objective:  Labs: CMP     Component Value Date/Time   NA 139 05/08/2022 1151   K 3.8 05/08/2022 1151   CL 101 05/08/2022 1151   CO2 21 05/08/2022 1151   GLUCOSE 91 05/08/2022 1151   GLUCOSE 97 06/24/2021 1048   BUN 15 05/08/2022 1151   CREATININE 0.75 05/08/2022 1151   CALCIUM 9.3 05/08/2022 1151   PROT 6.7 05/08/2022 1151   ALBUMIN 4.7 05/08/2022 1151   AST 16 05/08/2022 1151   ALT 14 05/08/2022 1151   ALKPHOS 72  05/08/2022 1151   BILITOT <0.2 05/08/2022 1151   GFRNONAA >60 06/24/2021 1048   GFRAA 119 10/03/2020 1131   Lab Results  Component Value Date   WBC 7.9 11/04/2021   HGB 12.8 11/04/2021   HCT 38.8 11/04/2021   MCV 91 11/04/2021   PLT 361 11/04/2021    Imaging Studies: No results found.  Assessment and Plan:   Catherine Chang is a 31 y.o. y/o female to follow-up for celiac disease.  Her TTG IgA levels have dropped significantly since going gluten-free diet.  Recently for a few weeks had worsening of her GI symptoms with diarrhea subsequently stopped all oatmeal and has had resolution of her symptoms.  Has on and off right upper quadrant pain.  Discussed diagnosis including but not limited to biliary dyskinesia which can be evaluated.  She would like to watch the next few months and if symptoms persist we will order a HIDA scan.  Her CAT scan in November 2022 showed abnormal position of the cecum which is located within the right upper quadrant no evidence of volvulus unclear if its clinical relevance.  I explained that even if he did a colonoscopy it would not change management.  We will recheck a celiac serology in March 2024 which would be 1 year from the diagnosis of celiac disease and then correlated with her symptoms to determine if further endoscopy and biopsies are required or not.        I discussed the assessment and treatment plan with the patient. The patient was provided an opportunity to ask questions and all were answered. The patient agreed with the plan and demonstrated an understanding of the instructions.   The patient was advised to call back or seek an in-person evaluation if the symptoms worsen or if the condition fails to improve as anticipated.  I provided 20 minutes of face-to-face time during this encounter.  Dr Catherine Bellows MD,MRCP [facility]) Gastroenterology/Hepatology Pager: [phone]   Speech recognition software was used to dictate this note.

## 2022-05-29 ENCOUNTER — Other Ambulatory Visit (HOSPITAL_COMMUNITY): Payer: Self-pay

## 2022-06-01 ENCOUNTER — Telehealth: Payer: Self-pay | Admitting: Internal Medicine

## 2022-06-01 NOTE — Telephone Encounter (Signed)
I saw Dr. Philipp Ovens today for changes to left breast.  She reports galactorrhea (bilateral at one point, but now unilateral, expressible) and intermittent skin changes and swelling of the left breast.  She had normal anatomy today, no swelling or pain.  She has no bruising today.  She was able to express a small amount of white/clear discharge from the left breast.  Testing has been normal.  PL was normal. She is due for MMG/US next week.   She did recently (within the last 2 months) start sertraline.  There is a theoretical increased bleeding risk due to platelet inhibition.  There is also about a 2% chance of galactorrhea.    I have advised her to wean off the sertraline and see if symptoms resolve.   Today, her breasts are equal, without swelling, pinpoint size of expressible discharge from the left nipple, no masses and no skin changes such as erythema or peau de orange.    She is amenable to plan.

## 2022-06-04 ENCOUNTER — Other Ambulatory Visit: Payer: Self-pay | Admitting: Internal Medicine

## 2022-06-04 ENCOUNTER — Encounter: Payer: 59 | Admitting: Internal Medicine

## 2022-06-04 DIAGNOSIS — E038 Other specified hypothyroidism: Secondary | ICD-10-CM

## 2022-06-09 ENCOUNTER — Ambulatory Visit
Admission: RE | Admit: 2022-06-09 | Discharge: 2022-06-09 | Disposition: A | Discharge status: Home / Self Care | Payer: 59 | Source: Ambulatory Visit | Attending: Internal Medicine | Admitting: Internal Medicine

## 2022-06-09 ENCOUNTER — Other Ambulatory Visit: Payer: Self-pay | Admitting: Internal Medicine

## 2022-06-09 ENCOUNTER — Other Ambulatory Visit (INDEPENDENT_AMBULATORY_CARE_PROVIDER_SITE_OTHER): Payer: 59

## 2022-06-09 DIAGNOSIS — E039 Hypothyroidism, unspecified: Secondary | ICD-10-CM

## 2022-06-09 DIAGNOSIS — N6320 Unspecified lump in the left breast, unspecified quadrant: Secondary | ICD-10-CM

## 2022-06-09 DIAGNOSIS — T148XXA Other injury of unspecified body region, initial encounter: Secondary | ICD-10-CM

## 2022-06-09 DIAGNOSIS — N6452 Nipple discharge: Secondary | ICD-10-CM

## 2022-06-09 DIAGNOSIS — N641 Fat necrosis of breast: Secondary | ICD-10-CM

## 2022-06-09 DIAGNOSIS — R92323 Mammographic fibroglandular density, bilateral breasts: Secondary | ICD-10-CM | POA: Diagnosis not present

## 2022-06-09 NOTE — Progress Notes (Signed)
Needs TSH (due after coming off of synthroid) and CBC with diff drawn given work up for breast mass.  Gilles Chiquito, MD

## 2022-06-10 LAB — CBC WITH DIFFERENTIAL/PLATELET
Basophils Absolute: 0 10*3/uL (ref 0.0–0.2)
Basos: 0 %
EOS (ABSOLUTE): 0 10*3/uL (ref 0.0–0.4)
Eos: 0 %
Hematocrit: 40.7 % (ref 34.0–46.6)
Hemoglobin: 13.1 g/dL (ref 11.1–15.9)
Immature Grans (Abs): 0.1 10*3/uL (ref 0.0–0.1)
Immature Granulocytes: 1 %
Lymphocytes Absolute: 2.7 10*3/uL (ref 0.7–3.1)
Lymphs: 22 %
MCH: 29.2 pg (ref 26.6–33.0)
MCHC: 32.2 g/dL (ref 31.5–35.7)
MCV: 91 fL (ref 79–97)
Monocytes Absolute: 0.6 10*3/uL (ref 0.1–0.9)
Monocytes: 5 %
Neutrophils Absolute: 8.5 10*3/uL — ABNORMAL HIGH (ref 1.4–7.0)
Neutrophils: 72 %
Platelets: 348 10*3/uL (ref 150–450)
RBC: 4.48 x10E6/uL (ref 3.77–5.28)
RDW: 13 % (ref 11.7–15.4)
WBC: 11.8 10*3/uL — ABNORMAL HIGH (ref 3.4–10.8)

## 2022-06-10 LAB — TSH: TSH: 1.92 u[IU]/mL (ref 0.450–4.500)

## 2022-06-12 ENCOUNTER — Other Ambulatory Visit (HOSPITAL_COMMUNITY): Payer: Self-pay

## 2022-06-16 ENCOUNTER — Ambulatory Visit
Admission: RE | Admit: 2022-06-16 | Discharge: 2022-06-16 | Disposition: A | Discharge status: Home / Self Care | Payer: 59 | Source: Ambulatory Visit | Attending: Internal Medicine | Admitting: Internal Medicine

## 2022-06-16 DIAGNOSIS — N6323 Unspecified lump in the left breast, lower outer quadrant: Secondary | ICD-10-CM | POA: Diagnosis not present

## 2022-06-16 DIAGNOSIS — N6320 Unspecified lump in the left breast, unspecified quadrant: Secondary | ICD-10-CM

## 2022-06-16 HISTORY — PX: BREAST BIOPSY: SHX20

## 2022-06-23 ENCOUNTER — Other Ambulatory Visit (HOSPITAL_COMMUNITY): Payer: Self-pay

## 2022-06-23 ENCOUNTER — Telehealth: Payer: Self-pay | Admitting: Internal Medicine

## 2022-06-23 MED ORDER — HYDROCOD POLI-CHLORPHE POLI ER 10-8 MG/5ML PO SUER
5.0000 mL | Freq: Two times a day (BID) | ORAL | 0 refills | Status: DC | PRN
  Filled 2022-06-23: qty 240, 24d supply, fill #0

## 2022-06-23 NOTE — Telephone Encounter (Signed)
Spoke with Catherine Chang today she is recovering well after her breast biopsy.  She does note a slight skin reaction after the procedure which resolved.  Overall reassured that biopsy showed lymph node as the suspicious lesion.  She did note that she has been struggling with a postviral cough especially at night that has been making sleep more difficult and was requesting a refill of the Tussionex which is reasonable.  We discussed we will have her follow-up in the clinic in a few weeks.

## 2022-06-29 ENCOUNTER — Other Ambulatory Visit: Payer: Self-pay | Admitting: Internal Medicine

## 2022-06-29 DIAGNOSIS — N6452 Nipple discharge: Secondary | ICD-10-CM

## 2022-06-29 NOTE — Progress Notes (Signed)
Catherine Chang underwent breast biopsy on 10/31 for bloody left nipple discharge, complicated by some mild post op bleeding and brusing.   Bx shows lymphoid tissues with focal dermatopathic changes and preforming physician recommended follow up with MRI.  Her brusing has nearly resolved however she has now noticed a 2cmx 2cm firm area under the biopsy site.  I suggested this may be a deeper hematoma given how quickly it developed after biopsy but we will go ahead and order the MRI.

## 2022-07-13 ENCOUNTER — Other Ambulatory Visit: Payer: Self-pay

## 2022-07-13 ENCOUNTER — Other Ambulatory Visit (HOSPITAL_COMMUNITY): Payer: Self-pay

## 2022-07-15 ENCOUNTER — Other Ambulatory Visit: Payer: Self-pay | Admitting: *Deleted

## 2022-07-22 ENCOUNTER — Other Ambulatory Visit: Payer: Self-pay | Admitting: Internal Medicine

## 2022-07-22 ENCOUNTER — Other Ambulatory Visit (HOSPITAL_COMMUNITY): Payer: Self-pay

## 2022-07-22 MED ORDER — METHOCARBAMOL 750 MG PO TABS
1500.0000 mg | ORAL_TABLET | Freq: Three times a day (TID) | ORAL | 2 refills | Status: DC | PRN
  Filled 2022-07-22: qty 60, 10d supply, fill #0
  Filled 2022-08-05: qty 60, 10d supply, fill #1
  Filled 2022-08-21: qty 60, 10d supply, fill #2

## 2022-07-22 MED ORDER — ZOLPIDEM TARTRATE 10 MG PO TABS
10.0000 mg | ORAL_TABLET | Freq: Every evening | ORAL | 4 refills | Status: DC | PRN
  Filled 2022-07-22 – 2022-08-12 (×2): qty 30, 30d supply, fill #0
  Filled 2022-09-10: qty 30, 30d supply, fill #1
  Filled 2022-10-10: qty 30, 30d supply, fill #2
  Filled 2022-11-09: qty 30, 30d supply, fill #3
  Filled 2022-12-08: qty 30, 30d supply, fill #4

## 2022-07-22 MED ORDER — NURTEC 75 MG PO TBDP
1.0000 | ORAL_TABLET | Freq: Every day | ORAL | 5 refills | Status: DC | PRN
  Filled 2022-07-22: qty 16, 30d supply, fill #0
  Filled 2022-10-27: qty 16, 30d supply, fill #1
  Filled 2023-02-26: qty 16, 30d supply, fill #2
  Filled 2023-03-22: qty 16, 30d supply, fill #3
  Filled 2023-05-25: qty 16, 30d supply, fill #4

## 2022-07-22 NOTE — Addendum Note (Signed)
Addended by: Joni Reining C on: 07/22/2022 11:54 AM   Modules accepted: Orders

## 2022-07-22 NOTE — Telephone Encounter (Addendum)
Request for refills of Nurtec and Robaxin, also refill ambien which will be due at end of month

## 2022-07-23 ENCOUNTER — Ambulatory Visit
Admission: RE | Admit: 2022-07-23 | Discharge: 2022-07-23 | Disposition: A | Discharge status: Home / Self Care | Payer: 59 | Source: Ambulatory Visit | Attending: Internal Medicine | Admitting: Internal Medicine

## 2022-07-23 DIAGNOSIS — S2002XA Contusion of left breast, initial encounter: Secondary | ICD-10-CM | POA: Diagnosis not present

## 2022-07-23 DIAGNOSIS — N6452 Nipple discharge: Secondary | ICD-10-CM

## 2022-07-23 MED ORDER — GADOPICLENOL 0.5 MMOL/ML IV SOLN
7.0000 mL | Freq: Once | INTRAVENOUS | Status: AC | PRN
  Administered 2022-07-23: 7 mL via INTRAVENOUS

## 2022-07-29 ENCOUNTER — Other Ambulatory Visit: Payer: Self-pay

## 2022-08-06 ENCOUNTER — Other Ambulatory Visit (HOSPITAL_COMMUNITY): Payer: Self-pay

## 2022-08-12 ENCOUNTER — Other Ambulatory Visit (HOSPITAL_COMMUNITY): Payer: Self-pay

## 2022-08-12 ENCOUNTER — Other Ambulatory Visit: Payer: Self-pay

## 2022-08-21 ENCOUNTER — Other Ambulatory Visit (HOSPITAL_COMMUNITY): Payer: Self-pay

## 2022-08-24 ENCOUNTER — Other Ambulatory Visit (HOSPITAL_COMMUNITY): Payer: Self-pay

## 2022-08-24 ENCOUNTER — Telehealth: Payer: Self-pay | Admitting: Internal Medicine

## 2022-08-24 ENCOUNTER — Other Ambulatory Visit: Payer: Self-pay

## 2022-08-24 DIAGNOSIS — O905 Postpartum thyroiditis: Secondary | ICD-10-CM | POA: Diagnosis not present

## 2022-08-24 DIAGNOSIS — Z8639 Personal history of other endocrine, nutritional and metabolic disease: Secondary | ICD-10-CM | POA: Diagnosis not present

## 2022-08-24 DIAGNOSIS — K900 Celiac disease: Secondary | ICD-10-CM | POA: Diagnosis not present

## 2022-08-24 MED ORDER — HYDROCOD POLI-CHLORPHE POLI ER 10-8 MG/5ML PO SUER
5.0000 mL | Freq: Two times a day (BID) | ORAL | 0 refills | Status: DC | PRN
  Filled 2022-08-24: qty 240, 24d supply, fill #0

## 2022-08-24 NOTE — Telephone Encounter (Signed)
Catherine Chang was sick over the weekend, cough, nasal congestion and left ear pain.  I did look in her ears and TM was clear bilaterally with good light reflex.  Suggested to continue flonase and pseudoephedrine prn. Will send in refill for tussionex to suppress cough.

## 2022-09-08 ENCOUNTER — Encounter: Payer: Self-pay | Admitting: Internal Medicine

## 2022-09-10 ENCOUNTER — Other Ambulatory Visit: Payer: 59

## 2022-09-11 ENCOUNTER — Other Ambulatory Visit: Payer: Self-pay

## 2022-09-11 ENCOUNTER — Other Ambulatory Visit (HOSPITAL_COMMUNITY): Payer: Self-pay

## 2022-09-14 ENCOUNTER — Other Ambulatory Visit (HOSPITAL_COMMUNITY): Payer: Self-pay

## 2022-09-14 ENCOUNTER — Telehealth: Payer: Self-pay | Admitting: Internal Medicine

## 2022-09-14 MED ORDER — AMOXICILLIN-POT CLAVULANATE 875-125 MG PO TABS
1.0000 | ORAL_TABLET | Freq: Two times a day (BID) | ORAL | 0 refills | Status: DC
  Filled 2022-09-14 (×2): qty 14, 7d supply, fill #0

## 2022-09-14 NOTE — Telephone Encounter (Signed)
Loa reports lingering sinus infection over about 4 weeks, has persisted despite neti pot, flonase and pseudofed.  Cough well controlled with prn tussionex.  Will try rx of Augmentin x 7 days.

## 2022-10-05 DIAGNOSIS — R519 Headache, unspecified: Secondary | ICD-10-CM | POA: Diagnosis not present

## 2022-10-10 ENCOUNTER — Other Ambulatory Visit (HOSPITAL_COMMUNITY): Payer: Self-pay

## 2022-10-12 ENCOUNTER — Other Ambulatory Visit: Payer: Self-pay

## 2022-10-15 ENCOUNTER — Ambulatory Visit: Payer: 59 | Admitting: Psychiatry

## 2022-10-16 DIAGNOSIS — Z91048 Other nonmedicinal substance allergy status: Secondary | ICD-10-CM | POA: Diagnosis not present

## 2022-10-16 DIAGNOSIS — K900 Celiac disease: Secondary | ICD-10-CM | POA: Diagnosis not present

## 2022-10-16 DIAGNOSIS — Z9851 Tubal ligation status: Secondary | ICD-10-CM | POA: Diagnosis not present

## 2022-10-16 DIAGNOSIS — Z9889 Other specified postprocedural states: Secondary | ICD-10-CM | POA: Diagnosis not present

## 2022-10-16 DIAGNOSIS — Z01419 Encounter for gynecological examination (general) (routine) without abnormal findings: Secondary | ICD-10-CM | POA: Diagnosis not present

## 2022-10-16 DIAGNOSIS — N920 Excessive and frequent menstruation with regular cycle: Secondary | ICD-10-CM | POA: Diagnosis not present

## 2022-10-16 DIAGNOSIS — G43909 Migraine, unspecified, not intractable, without status migrainosus: Secondary | ICD-10-CM | POA: Diagnosis not present

## 2022-10-19 ENCOUNTER — Other Ambulatory Visit: Payer: Commercial Managed Care - PPO

## 2022-10-19 ENCOUNTER — Telehealth: Payer: Self-pay

## 2022-10-19 ENCOUNTER — Other Ambulatory Visit: Payer: Self-pay | Admitting: Internal Medicine

## 2022-10-19 DIAGNOSIS — K900 Celiac disease: Secondary | ICD-10-CM

## 2022-10-19 NOTE — Telephone Encounter (Signed)
Patient left a voicemail stating she needing to make a follow up appointment with Dr. Vicente Males. Called and made a appointment for 01/12/23. Patient wanted it to be a mychart visit

## 2022-10-19 NOTE — Addendum Note (Signed)
Addended by: Truddie Crumble on: 10/19/2022 01:58 PM   Modules accepted: Orders

## 2022-10-19 NOTE — Progress Notes (Signed)
Needs celiac lab retesting will reorder dr anna's labs to allow Candida to obtain at our clinic for convience

## 2022-10-21 NOTE — Progress Notes (Signed)
Thank you so much ,   Herb Grays can we do a video visit with Dr Philipp Ovens once am back with vacation to discuss the results, appears she still likely has some gluten cross contamination in her diet and we need to discuss further as her celiac serology is still positive which should have been negative at this point.

## 2022-10-26 ENCOUNTER — Telehealth: Payer: Self-pay

## 2022-10-26 NOTE — Telephone Encounter (Signed)
Prior Authorization for patient (nurtec) came through on cover my meds was submitted with last office notes awaiting approval or denial

## 2022-10-27 ENCOUNTER — Other Ambulatory Visit (HOSPITAL_COMMUNITY): Payer: Self-pay

## 2022-10-27 DIAGNOSIS — Z1329 Encounter for screening for other suspected endocrine disorder: Secondary | ICD-10-CM | POA: Diagnosis not present

## 2022-10-27 DIAGNOSIS — Z30430 Encounter for insertion of intrauterine contraceptive device: Secondary | ICD-10-CM | POA: Diagnosis not present

## 2022-10-27 DIAGNOSIS — N920 Excessive and frequent menstruation with regular cycle: Secondary | ICD-10-CM | POA: Diagnosis not present

## 2022-10-27 LAB — ENDOMYSIAL IGA ANTIBODY: Endomysial IgA: NEGATIVE

## 2022-10-27 LAB — CELIAC DISEASE AB SCREEN W/RFX
Antigliadin Abs, IgA: 49 units — ABNORMAL HIGH (ref 0–19)
IgA/Immunoglobulin A, Serum: 185 mg/dL (ref 87–352)
Transglutaminase IgA: 6 U/mL — ABNORMAL HIGH (ref 0–3)

## 2022-10-27 NOTE — Telephone Encounter (Signed)
Decision:Approved Catherine Chang (Key: [id number]) PA Case ID #: [id number] Need Help? Call us at [phone] Outcome Approved on March 11 The request has been approved. The authorization is effective from 11/07/2022 to 11/06/2023, as long as the member is enrolled in their current health plan. The request was approved as submitted. This request was approved with a quantity limit of 16 tablets every 30 days. A written notification letter will follow with additional details. Authorization Expiration Date: 11/05/2023 Drug Nurtec '75MG'$  dispersible tablets ePA cloud logo Form [employer] ePA Form 2017 NCPDP

## 2022-10-30 ENCOUNTER — Ambulatory Visit (HOSPITAL_COMMUNITY)
Admission: RE | Admit: 2022-10-30 | Discharge: 2022-10-30 | Disposition: A | Discharge status: Home / Self Care | Payer: Commercial Managed Care - PPO | Source: Ambulatory Visit | Attending: Internal Medicine | Admitting: Internal Medicine

## 2022-10-30 ENCOUNTER — Other Ambulatory Visit: Payer: Self-pay | Admitting: Internal Medicine

## 2022-10-30 DIAGNOSIS — R1011 Right upper quadrant pain: Secondary | ICD-10-CM | POA: Insufficient documentation

## 2022-10-30 DIAGNOSIS — K5900 Constipation, unspecified: Secondary | ICD-10-CM | POA: Diagnosis not present

## 2022-10-30 DIAGNOSIS — R109 Unspecified abdominal pain: Secondary | ICD-10-CM | POA: Diagnosis not present

## 2022-10-30 NOTE — Progress Notes (Signed)
Spoke with Catherine Chang, she has been having 2 weeks of constipation despite miralax, enemas with minimal success in passing stool.  Recently had tsh checked at [facility] and was normal.  Brief exam shows RUQ, LUQ and LLQ abdominal pain, firmness in LLQ consisted with stool presence. Previous history of cecum located in RUQ.  Obtain KUB to rule out obstruction.

## 2022-11-03 ENCOUNTER — Other Ambulatory Visit: Payer: Self-pay

## 2022-11-05 ENCOUNTER — Telehealth: Payer: Self-pay | Admitting: Internal Medicine

## 2022-11-05 ENCOUNTER — Other Ambulatory Visit: Payer: Self-pay

## 2022-11-05 ENCOUNTER — Telehealth: Payer: Commercial Managed Care - PPO | Admitting: Gastroenterology

## 2022-11-05 DIAGNOSIS — R1011 Right upper quadrant pain: Secondary | ICD-10-CM | POA: Diagnosis not present

## 2022-11-05 DIAGNOSIS — R768 Other specified abnormal immunological findings in serum: Secondary | ICD-10-CM

## 2022-11-05 DIAGNOSIS — G8929 Other chronic pain: Secondary | ICD-10-CM | POA: Diagnosis not present

## 2022-11-05 DIAGNOSIS — K900 Celiac disease: Secondary | ICD-10-CM | POA: Diagnosis not present

## 2022-11-05 MED ORDER — AMPHETAMINE-DEXTROAMPHETAMINE 10 MG PO TABS
10.0000 mg | ORAL_TABLET | Freq: Every day | ORAL | 0 refills | Status: DC
  Filled 2022-11-05 – 2022-11-06 (×2): qty 60, 60d supply, fill #0

## 2022-11-05 NOTE — Addendum Note (Signed)
Addended by: Wayna Chalet on: 11/05/2022 03:40 PM   Modules accepted: Orders

## 2022-11-05 NOTE — Telephone Encounter (Signed)
Spoke with Catherine Chang today she has had some additional work projects that she has been falling behind in.  She is interested in restarting her Adderall which she has been off of for several months.  Discussed this would be fine we will restart her at her previous dose of Adderall 10 mg twice a day.  Have her schedule follow-up with me in about a month to see how she is doing if we need to make adjustments.

## 2022-11-05 NOTE — Progress Notes (Signed)
Catherine Chang , MD [address]  [place]  [place], [place] [address]  Main: [phone]  Fax: [phone]   Primary Care Physician: Lucious Groves, DO  Virtual Visit via Video Note  I connected with patient on 11/05/22 at  3:15 PM EDT by video and verified that I am speaking with the correct person using two identifiers.   I discussed the limitations, risks, security and privacy concerns of performing an evaluation and management service by video  and the availability of in person appointments. I also discussed with the patient that there may be a patient responsible charge related to this service. The patient expressed understanding and agreed to proceed.  Location of Patient: Home Location of Provider: Home Persons involved: Patient and provider only   History of Present Illness: Chief Complaint  Patient presents with   Celiac Disease    HPI: Catherine Chang is a 32 y.o. female      Summary of history :     She was diagnosed with celiac disease in 10/2021, TTG IgG was 72. Duodenal biopsies showed complete villous atrophy.  No known family history of celiac disease but mother had GI issues.  She herself has not been diagnosed with GI issues but has had a history of constipation.  Some [place] ancestry.  She has had 2 C-sections.  History of hypothyroidism  11/04/2021 hemoglobin 12.8 g MCV of 91.,Year back was found to have hypothyroidism. 11/04/2021: B12 normal, vitamin D B6, B3, B2, B1 normal    Interval history    10/19/2022 TTG IgA 6 down from 18:  6 months back, Endomesial antibody negative      Recent phone call with new GI symptoms and right upper quadrant pain recent TTG IgA elevated at 18 despite being on a gluten-free diet   Recent patient episode of severe constipation had to take an enema and then was commenced on MiraLAX 2-3 times a day which has helped.  She has persistent right upper quadrant discomfort worse after meals lasting a few hours  worse with fatty meals.  No better after good bowel movement.  Very compliant with a gluten-free diet.  Current Outpatient Medications  Medication Sig Dispense Refill   amoxicillin-clavulanate (AUGMENTIN) 875-125 MG tablet Take 1 tablet by mouth 2 times daily. 14 tablet 0   chlorpheniramine-HYDROcodone (TUSSIONEX) 10-8 MG/5ML Take 5 mLs by mouth every 12 (twelve) hours as needed for cough. 240 mL 0   fluticasone (FLONASE) 50 MCG/ACT nasal spray Place 1 spray into both nostrils daily as needed for allergies or rhinitis.     levonorgestrel (MIRENA, 52 MG,) 20 MCG/DAY IUD 1 each by Intrauterine route once.     methocarbamol (ROBAXIN-750) 750 MG tablet Take 2 tablets by mouth every 8 hours as needed for muscle spasms. 60 tablet 2   Rimegepant Sulfate (NURTEC) 75 MG TBDP Dissolve 1 tablet by mouth daily as needed for migraine. 16 tablet 5   sertraline (ZOLOFT) 50 MG tablet Take 1 tablet (50 mg total) by mouth daily. 90 tablet 3   zolpidem (AMBIEN) 10 MG tablet Take 1 tablet by mouth at bedtime as needed for sleep. 30 tablet 4   No current facility-administered medications for this visit.    Allergies as of 11/05/2022 - Review Complete 05/25/2022  Allergen Reaction Noted   Wound dressing adhesive Hives 05/25/2022    Review of Systems:    All systems reviewed and negative except where noted in HPI.  General Appearance:  Alert, cooperative, no distress, appears stated age  Head:    Normocephalic, without obvious abnormality, atraumatic  Eyes:    PERRL, conjunctiva/corneas clear,  Ears:    Grossly normal hearing    Neurologic:  Grossly normal    Observations/Objective:  Labs: CMP     Component Value Date/Time   NA 139 05/08/2022 1151   K 3.8 05/08/2022 1151   CL 101 05/08/2022 1151   CO2 21 05/08/2022 1151   GLUCOSE 91 05/08/2022 1151   GLUCOSE 97 06/24/2021 1048   BUN 15 05/08/2022 1151   CREATININE 0.75 05/08/2022 1151   CALCIUM 9.3 05/08/2022 1151   PROT 6.7 05/08/2022 1151    ALBUMIN 4.7 05/08/2022 1151   AST 16 05/08/2022 1151   ALT 14 05/08/2022 1151   ALKPHOS 72 05/08/2022 1151   BILITOT <0.2 05/08/2022 1151   GFRNONAA >60 06/24/2021 1048   GFRAA 119 10/03/2020 1131   Lab Results  Component Value Date   WBC 11.8 (H) 06/09/2022   HGB 13.1 06/09/2022   HCT 40.7 06/09/2022   MCV 91 06/09/2022   PLT 348 06/09/2022    Imaging Studies: DG Abd 1 View  Result Date: 11/01/2022 CLINICAL DATA:  Pain EXAM: ABDOMEN - 1 VIEW COMPARISON:  None Available. FINDINGS: The bowel gas pattern is normal. No radio-opaque calculi or other significant radiographic abnormality are seen. IMPRESSION: Negative. Electronically Signed   By: Sammie Bench M.D.   On: 11/01/2022 10:13    Assessment and Plan:   Catherine Chang is a 32 y.o. y/o female here to follow-up for celiac disease.  Her TTG IgA levels have dropped significantly since going gluten-free diet.  She has had some persistent right upper quadrant discomfort the question is if this is gallbladder dyskinesia.  She is very compliant with a gluten-free diet has some inadvertent contamination with medications.  Prior CT scan of the abdomen in 2022 demonstrated cecum located within the right upper quadrant interposed anterior to the right lobe of the liver anatomic variant of uncertain significance.  Plan 1.  HIDA scan 2.  Continue gluten-free diet 3.  Recheck celiac serology in 6 months if the trend continues to improve will watch if celiac serology is nonresponsive may need EGD and biopsies 4.  Based on HIDA scan and her symptoms may need to consider colonoscopy at some point   I discussed the assessment and treatment plan with the patient. The patient was provided an opportunity to ask questions and all were answered. The patient agreed with the plan and demonstrated an understanding of the instructions.   The patient was advised to call back or seek an in-person evaluation if the symptoms worsen or if the  condition fails to improve as anticipated.  I provided 39  minutes of face-to-face time during this encounter.  Dr Catherine Bellows MD,MRCP [facility]) Gastroenterology/Hepatology Pager: [phone]   Speech recognition software was used to dictate this note.

## 2022-11-06 ENCOUNTER — Other Ambulatory Visit (HOSPITAL_COMMUNITY): Payer: Self-pay

## 2022-11-10 ENCOUNTER — Other Ambulatory Visit: Payer: Self-pay

## 2022-11-10 ENCOUNTER — Other Ambulatory Visit (HOSPITAL_COMMUNITY): Payer: Self-pay

## 2022-11-16 ENCOUNTER — Encounter
Admission: RE | Admit: 2022-11-16 | Discharge: 2022-11-16 | Disposition: A | Discharge status: Home / Self Care | Payer: Commercial Managed Care - PPO | Source: Ambulatory Visit | Attending: Gastroenterology | Admitting: Gastroenterology

## 2022-11-16 DIAGNOSIS — G8929 Other chronic pain: Secondary | ICD-10-CM | POA: Diagnosis not present

## 2022-11-16 DIAGNOSIS — K900 Celiac disease: Secondary | ICD-10-CM | POA: Diagnosis not present

## 2022-11-16 DIAGNOSIS — R768 Other specified abnormal immunological findings in serum: Secondary | ICD-10-CM | POA: Insufficient documentation

## 2022-11-16 DIAGNOSIS — R109 Unspecified abdominal pain: Secondary | ICD-10-CM | POA: Diagnosis not present

## 2022-11-16 DIAGNOSIS — R1011 Right upper quadrant pain: Secondary | ICD-10-CM | POA: Insufficient documentation

## 2022-11-16 MED ORDER — TECHNETIUM TC 99M MEBROFENIN IV KIT
5.0000 | PACK | Freq: Once | INTRAVENOUS | Status: AC
  Administered 2022-11-16: 5.47 via INTRAVENOUS

## 2022-11-17 NOTE — Progress Notes (Signed)
Normal

## 2022-11-23 ENCOUNTER — Other Ambulatory Visit: Payer: Self-pay | Admitting: Internal Medicine

## 2022-12-08 ENCOUNTER — Other Ambulatory Visit: Payer: Self-pay

## 2022-12-08 DIAGNOSIS — N920 Excessive and frequent menstruation with regular cycle: Secondary | ICD-10-CM | POA: Diagnosis not present

## 2022-12-08 DIAGNOSIS — N946 Dysmenorrhea, unspecified: Secondary | ICD-10-CM | POA: Diagnosis not present

## 2022-12-08 DIAGNOSIS — Z30431 Encounter for routine checking of intrauterine contraceptive device: Secondary | ICD-10-CM | POA: Diagnosis not present

## 2022-12-15 ENCOUNTER — Other Ambulatory Visit: Payer: Self-pay

## 2022-12-15 ENCOUNTER — Telehealth (INDEPENDENT_AMBULATORY_CARE_PROVIDER_SITE_OTHER): Payer: Commercial Managed Care - PPO | Admitting: Internal Medicine

## 2022-12-15 ENCOUNTER — Other Ambulatory Visit (HOSPITAL_COMMUNITY): Payer: Self-pay

## 2022-12-15 MED ORDER — AMOXICILLIN-POT CLAVULANATE 875-125 MG PO TABS
1.0000 | ORAL_TABLET | Freq: Two times a day (BID) | ORAL | 0 refills | Status: DC
  Filled 2022-12-15: qty 14, 7d supply, fill #0

## 2022-12-15 NOTE — Telephone Encounter (Signed)
Catherine Chang called me, she is some sinusitis she symptoms only worse for the last 2 weeks and requesting antibiotics.  Informed will prescribe Augmentin 7-day course for.  She will let me know if symptoms do not improve as expected.

## 2022-12-23 ENCOUNTER — Other Ambulatory Visit (HOSPITAL_COMMUNITY): Payer: Self-pay

## 2022-12-23 ENCOUNTER — Other Ambulatory Visit: Payer: Self-pay | Admitting: Internal Medicine

## 2022-12-23 MED ORDER — AMPHETAMINE-DEXTROAMPHETAMINE 10 MG PO TABS
10.0000 mg | ORAL_TABLET | Freq: Two times a day (BID) | ORAL | 0 refills | Status: DC
  Filled 2022-12-23 – 2022-12-24 (×4): qty 60, 30d supply, fill #0
  Filled ????-??-??: fill #0

## 2022-12-23 NOTE — Telephone Encounter (Signed)
Refilled adderall per request

## 2022-12-24 ENCOUNTER — Other Ambulatory Visit (HOSPITAL_COMMUNITY): Payer: Self-pay

## 2023-01-05 ENCOUNTER — Other Ambulatory Visit: Payer: Self-pay

## 2023-01-07 ENCOUNTER — Other Ambulatory Visit: Payer: Self-pay | Admitting: Internal Medicine

## 2023-01-07 ENCOUNTER — Other Ambulatory Visit (HOSPITAL_COMMUNITY): Payer: Self-pay

## 2023-01-07 MED ORDER — ZOLPIDEM TARTRATE 10 MG PO TABS
10.0000 mg | ORAL_TABLET | Freq: Every evening | ORAL | 4 refills | Status: DC | PRN
  Filled 2023-01-07: qty 30, 30d supply, fill #0
  Filled 2023-02-10: qty 30, 30d supply, fill #1
  Filled 2023-03-11: qty 30, 30d supply, fill #2
  Filled 2023-04-10: qty 30, 30d supply, fill #3
  Filled 2023-05-13: qty 30, 30d supply, fill #4

## 2023-01-08 ENCOUNTER — Other Ambulatory Visit: Payer: Self-pay

## 2023-01-12 ENCOUNTER — Telehealth: Payer: Commercial Managed Care - PPO | Admitting: Gastroenterology

## 2023-01-12 ENCOUNTER — Other Ambulatory Visit: Payer: Self-pay

## 2023-01-12 ENCOUNTER — Other Ambulatory Visit (HOSPITAL_COMMUNITY): Payer: Self-pay

## 2023-01-12 DIAGNOSIS — R1011 Right upper quadrant pain: Secondary | ICD-10-CM

## 2023-01-12 DIAGNOSIS — G8929 Other chronic pain: Secondary | ICD-10-CM | POA: Diagnosis not present

## 2023-01-12 DIAGNOSIS — K900 Celiac disease: Secondary | ICD-10-CM | POA: Diagnosis not present

## 2023-01-12 MED ORDER — DICYCLOMINE HCL 10 MG PO CAPS
10.0000 mg | ORAL_CAPSULE | Freq: Three times a day (TID) | ORAL | 0 refills | Status: DC
  Filled 2023-01-12: qty 90, 23d supply, fill #0

## 2023-01-12 NOTE — Progress Notes (Signed)
Catherine Chang , MD [address]  [address]  [place], [place] [address]  Main: [phone]  Fax: [phone]   Primary Care Physician: Gust Rung, DO  Virtual Visit via Video Note  I connected with patient on 01/12/23 at  2:45 PM EDT by video and verified that I am speaking with the correct person using two identifiers.   I discussed the limitations, risks, security and privacy concerns of performing an evaluation and management service by video  and the availability of in person appointments. I also discussed with the patient that there may be a patient responsible charge related to this service. The patient expressed understanding and agreed to proceed.  Location of Patient: Home Location of Provider: Home Persons involved: Patient and provider only   History of Present Illness: Chief Complaint  Patient presents with   Celiac Disease    HPI: Catherine Chang is a 32 y.o. female  Summary of history :     She was diagnosed with celiac disease in 10/2021, TTG IgG was 72. Duodenal biopsies showed complete villous atrophy.  No known family history of celiac disease but mother had GI issues.  She herself has not been diagnosed with GI issues but has had a history of constipation.  Some [place] ancestry.  She has had 2 C-sections.  History of hypothyroidism  11/04/2021 hemoglobin 12.8 g MCV of 91.,Year back was found to have hypothyroidism. 11/04/2021: B12 normal, vitamin D B6, B3, B2, B1 normal   10/19/2022 TTG IgA 6 down from 18:  6 months back, Endomesial antibody negative    Interval history 11/05/2022-01/12/2023   11/16/2022: HIDA scan : normal    Doing well overall , occasional RUQ pain after meals.   Migraines better. Constipation  much better . Was an acute issue, resolved . On strict gluten \\free  diet .   Current Outpatient Medications  Medication Sig Dispense Refill   dicyclomine (BENTYL) 10 MG capsule Take 1 capsule (10 mg total) by mouth 4 (four) times daily  -  before meals and at bedtime. 90 capsule 0   amoxicillin-clavulanate (AUGMENTIN) 875-125 MG tablet Take 1 tablet by mouth 2 (two) times daily. 14 tablet 0   amphetamine-dextroamphetamine (ADDERALL) 10 MG tablet Take 1 tablet (10 mg total) by mouth 2 (two) times daily with a meal. 60 tablet 0   levonorgestrel (MIRENA, 52 MG,) 20 MCG/DAY IUD 1 each by Intrauterine route once.     Rimegepant Sulfate (NURTEC) 75 MG TBDP Dissolve 1 tablet by mouth daily as needed for migraine. 16 tablet 5   sertraline (ZOLOFT) 50 MG tablet Take 1 tablet (50 mg total) by mouth daily. 90 tablet 3   zolpidem (AMBIEN) 10 MG tablet Take 1 tablet by mouth at bedtime as needed for sleep. 30 tablet 4   No current facility-administered medications for this visit.    Allergies as of 01/12/2023 - Review Complete 11/16/2022  Allergen Reaction Noted   Wound dressing adhesive Hives 05/25/2022    Review of Systems:    All systems reviewed and negative except where noted in HPI.  General Appearance:    Alert, cooperative, no distress, appears stated age  Head:    Normocephalic, without obvious abnormality, atraumatic  Eyes:    PERRL, conjunctiva/corneas clear,  Ears:    Grossly normal hearing    Neurologic:  Grossly normal    Observations/Objective:  Labs: CMP     Component Value Date/Time   NA 139 05/08/2022 1151   K  3.8 05/08/2022 1151   CL 101 05/08/2022 1151   CO2 21 05/08/2022 1151   GLUCOSE 91 05/08/2022 1151   GLUCOSE 97 06/24/2021 1048   BUN 15 05/08/2022 1151   CREATININE 0.75 05/08/2022 1151   CALCIUM 9.3 05/08/2022 1151   PROT 6.7 05/08/2022 1151   ALBUMIN 4.7 05/08/2022 1151   AST 16 05/08/2022 1151   ALT 14 05/08/2022 1151   ALKPHOS 72 05/08/2022 1151   BILITOT <0.2 05/08/2022 1151   GFRNONAA >60 06/24/2021 1048   GFRAA 119 10/03/2020 1131   Lab Results  Component Value Date   WBC 11.8 (H) 06/09/2022   HGB 13.1 06/09/2022   HCT 40.7 06/09/2022   MCV 91 06/09/2022   PLT 348 06/09/2022     Imaging Studies: No results found.  Assessment and Plan:   Catherine Chang is a 32 y.o. y/o female  here to follow-up for celiac disease.  Her TTG IgA levels have dropped significantly since going gluten-free diet.  Doing well on a gluten-free diet.  Symptoms of migraine and abdominal discomfort has significantly improved still gets occasional right upper quadrant discomfort after she eats. Prior CT scan of the abdomen in 2022 demonstrated cecum located within the right upper quadrant interposed anterior to the right lobe of the liver anatomic variant of uncertain significance.   Plan 1.  Continue gluten-free diet 2.  Trial of dicyclomine to use as needed for abdominal cramping 3.  Recheck celiac serology in 6 months if the trend continues to improve will watch if celiac serology is nonresponsive may need EGD and biopsies          I discussed the assessment and treatment plan with the patient. The patient was provided an opportunity to ask questions and all were answered. The patient agreed with the plan and demonstrated an understanding of the instructions.   The patient was advised to call back or seek an in-person evaluation if the symptoms worsen or if the condition fails to improve as anticipated.  I provided 15 minutes of face-to-face time during this encounter.  Dr Catherine Mood MD,MRCP [facility]) Gastroenterology/Hepatology Pager: [phone]   Speech recognition software was used to dictate this note.

## 2023-01-28 ENCOUNTER — Other Ambulatory Visit (HOSPITAL_COMMUNITY): Payer: Self-pay

## 2023-01-28 ENCOUNTER — Other Ambulatory Visit: Payer: Self-pay

## 2023-01-28 ENCOUNTER — Encounter: Payer: Self-pay | Admitting: Internal Medicine

## 2023-01-28 ENCOUNTER — Ambulatory Visit (INDEPENDENT_AMBULATORY_CARE_PROVIDER_SITE_OTHER): Payer: Commercial Managed Care - PPO | Admitting: Internal Medicine

## 2023-01-28 VITALS — BP 128/82 | HR 58 | Temp 98.0°F | Ht 62.0 in | Wt 149.3 lb

## 2023-01-28 DIAGNOSIS — Z0000 Encounter for general adult medical examination without abnormal findings: Secondary | ICD-10-CM

## 2023-01-28 DIAGNOSIS — F909 Attention-deficit hyperactivity disorder, unspecified type: Secondary | ICD-10-CM

## 2023-01-28 MED ORDER — AMPHETAMINE-DEXTROAMPHETAMINE 10 MG PO TABS
10.0000 mg | ORAL_TABLET | Freq: Two times a day (BID) | ORAL | 0 refills | Status: DC
  Filled 2023-01-28 – 2023-01-29 (×2): qty 60, 30d supply, fill #0

## 2023-01-28 NOTE — Progress Notes (Signed)
Established Patient Office Visit  Subjective   Patient ID: Catherine Chang, female    DOB: [birth year]  Age: 32 y.o. MRN: [id number]  Chief Complaint  Patient presents with   Annual Exam    No problems   Catherine Chang presents today for a annual wellness visit.  Her chronic conditions namely celiac's disease and migraines have been greatly improved recently.  She has been following up with Dr. Esmond Camper of GI and thinks that her gluten-free diet has been gradually helping a lot of her chronic symptomatology.  Her antibody labs have still been elevated she will be due for repeat labs a little later this year and if still elevated she will need to go for repeat EGD and biopsy.  She plans to recheck her TSH when she goes for that blood draw.  Working out 5-6x per week. Run, swim, lifting recently.    Eating paleo- gluten free diet.  Saw gyn this year, uptodate with cervical/ breast cancer screening IUD helping with menorragia>> much lighter periods Seeing dentist 2x per year.   Objective:     BP 128/82 (BP Location: Right Arm, Patient Position: Sitting, Cuff Size: Normal)   Pulse (!) 58   Temp 98 F (36.7 C) (Oral)   Ht 5\' 2"  (1.575 m)   Wt 149 lb 4.8 oz (67.7 kg)   BMI 27.31 kg/m  BP Readings from Last 3 Encounters:  01/28/23 128/82  05/08/22 126/76  04/08/22 124/72   Wt Readings from Last 3 Encounters:  01/28/23 149 lb 4.8 oz (67.7 kg)  05/08/22 150 lb 14.4 oz (68.4 kg)  04/08/22 154 lb 2 oz (69.9 kg)      Physical Exam Vitals and nursing note reviewed.  Constitutional:      General: She is not in acute distress.    Appearance: Normal appearance. She is not ill-appearing.  Pulmonary:     Effort: Pulmonary effort is normal.  Skin:    General: Skin is warm and dry.  Neurological:     Mental Status: She is alert.  Psychiatric:        Mood and Affect: Mood normal.        Behavior: Behavior normal.      No results found for any visits on 01/28/23.  Last CBC Lab  Results  Component Value Date   WBC 11.8 (H) 06/09/2022   HGB 13.1 06/09/2022   HCT 40.7 06/09/2022   MCV 91 06/09/2022   MCH 29.2 06/09/2022   RDW 13.0 06/09/2022   PLT 348 06/09/2022   Last metabolic panel Lab Results  Component Value Date   GLUCOSE 91 05/08/2022   NA 139 05/08/2022   K 3.8 05/08/2022   CL 101 05/08/2022   CO2 21 05/08/2022   BUN 15 05/08/2022   CREATININE 0.75 05/08/2022   EGFR 108 05/08/2022   CALCIUM 9.3 05/08/2022   PROT 6.7 05/08/2022   ALBUMIN 4.7 05/08/2022   LABGLOB 2.0 05/08/2022   AGRATIO 2.4 (H) 05/08/2022   BILITOT <0.2 05/08/2022   ALKPHOS 72 05/08/2022   AST 16 05/08/2022   ALT 14 05/08/2022   ANIONGAP 10 06/24/2021   Last lipids Lab Results  Component Value Date   CHOL 183 05/01/2021   HDL 65 05/01/2021   LDLCALC 108 (H) 05/01/2021   TRIG 54 05/01/2021   CHOLHDL 2.8 05/01/2021   Last hemoglobin A1c Lab Results  Component Value Date   HGBA1C 5.5 09/22/2021   Last thyroid functions Lab Results  Component Value Date  TSH 1.920 06/09/2022   T3TOTAL 88 05/08/2022      The ASCVD Risk score (Arnett DK, et al., 2019) failed to calculate for the following reasons:   The 2019 ASCVD risk score is only valid for ages 39 to 8    Assessment & Plan:   Problem List Items Addressed This Visit       Other   ADHD (Chronic)    ADHD is well-controlled with twice daily as needed use of Adderall 60 tablets is lasting longer than 1 month as she does not take it on days that she does not need to.  Will go ahead and send in refill for another month.      Healthcare maintenance - Primary    Overall doing very well up-to-date with healthcare maintenance task.  Gets yearly vaccines with health at work. Following with GI-Dr Esmond Camper, and GYN- Dr Hester Mates ([facility]), and prn with Endo -Dr Sharl Ma.       Return in about 6 months (around 07/30/2023).    Gust Rung, DO

## 2023-01-29 ENCOUNTER — Other Ambulatory Visit (HOSPITAL_COMMUNITY): Payer: Self-pay

## 2023-01-29 NOTE — Assessment & Plan Note (Signed)
ADHD is well-controlled with twice daily as needed use of Adderall 60 tablets is lasting longer than 1 month as she does not take it on days that she does not need to.  Will go ahead and send in refill for another month.

## 2023-01-29 NOTE — Assessment & Plan Note (Signed)
Overall doing very well up-to-date with healthcare maintenance task.  Gets yearly vaccines with health at work. Following with GI-Dr Esmond Camper, and GYN- Dr Hester Mates ([facility]), and prn with Endo -Dr Sharl Ma.

## 2023-02-02 ENCOUNTER — Other Ambulatory Visit (HOSPITAL_COMMUNITY): Payer: Self-pay

## 2023-02-10 ENCOUNTER — Other Ambulatory Visit (HOSPITAL_BASED_OUTPATIENT_CLINIC_OR_DEPARTMENT_OTHER): Payer: Self-pay

## 2023-02-11 ENCOUNTER — Other Ambulatory Visit: Payer: Self-pay

## 2023-02-14 ENCOUNTER — Other Ambulatory Visit: Payer: Self-pay | Admitting: Gastroenterology

## 2023-02-15 ENCOUNTER — Other Ambulatory Visit: Payer: Self-pay

## 2023-02-15 ENCOUNTER — Other Ambulatory Visit (HOSPITAL_COMMUNITY): Payer: Self-pay

## 2023-02-15 MED ORDER — DICYCLOMINE HCL 10 MG PO CAPS
10.0000 mg | ORAL_CAPSULE | Freq: Three times a day (TID) | ORAL | 0 refills | Status: DC
  Filled 2023-02-15: qty 90, 23d supply, fill #0

## 2023-02-26 ENCOUNTER — Other Ambulatory Visit: Payer: Self-pay | Admitting: Internal Medicine

## 2023-02-26 ENCOUNTER — Other Ambulatory Visit (HOSPITAL_COMMUNITY): Payer: Self-pay

## 2023-02-26 MED ORDER — AMPHETAMINE-DEXTROAMPHETAMINE 10 MG PO TABS
10.0000 mg | ORAL_TABLET | Freq: Two times a day (BID) | ORAL | 0 refills | Status: DC
  Filled 2023-02-26: qty 60, 30d supply, fill #0

## 2023-02-26 NOTE — Progress Notes (Signed)
Refilled adderall rx °

## 2023-03-12 ENCOUNTER — Other Ambulatory Visit: Payer: Self-pay

## 2023-03-12 ENCOUNTER — Other Ambulatory Visit (HOSPITAL_COMMUNITY): Payer: Self-pay

## 2023-03-22 ENCOUNTER — Other Ambulatory Visit (HOSPITAL_COMMUNITY): Payer: Self-pay

## 2023-03-22 ENCOUNTER — Other Ambulatory Visit: Payer: Self-pay | Admitting: Gastroenterology

## 2023-03-22 MED ORDER — DICYCLOMINE HCL 10 MG PO CAPS
10.0000 mg | ORAL_CAPSULE | Freq: Three times a day (TID) | ORAL | 3 refills | Status: DC
  Filled 2023-03-22: qty 90, 23d supply, fill #0

## 2023-03-23 ENCOUNTER — Other Ambulatory Visit: Payer: Commercial Managed Care - PPO

## 2023-03-23 ENCOUNTER — Other Ambulatory Visit: Payer: Self-pay | Admitting: Internal Medicine

## 2023-03-23 DIAGNOSIS — R6889 Other general symptoms and signs: Secondary | ICD-10-CM

## 2023-03-23 LAB — RESP PANEL BY RT-PCR (RSV, FLU A&B, COVID)  RVPGX2
Influenza A by PCR: NEGATIVE
Influenza B by PCR: NEGATIVE
Resp Syncytial Virus by PCR: NEGATIVE
SARS Coronavirus 2 by RT PCR: NEGATIVE

## 2023-04-05 ENCOUNTER — Telehealth: Payer: Self-pay | Admitting: Internal Medicine

## 2023-04-05 ENCOUNTER — Encounter: Payer: Self-pay | Admitting: Pharmacist

## 2023-04-05 ENCOUNTER — Other Ambulatory Visit: Payer: Self-pay

## 2023-04-05 ENCOUNTER — Other Ambulatory Visit (HOSPITAL_COMMUNITY): Payer: Self-pay

## 2023-04-05 MED ORDER — AMOXICILLIN-POT CLAVULANATE 875-125 MG PO TABS
1.0000 | ORAL_TABLET | Freq: Two times a day (BID) | ORAL | 0 refills | Status: DC
  Filled 2023-04-05 (×2): qty 14, 7d supply, fill #0

## 2023-04-05 NOTE — Telephone Encounter (Signed)
>  2 weeks of sinus congestion/sinusitis ear fullness.  Recovered from Covid but sinus symptoms persisit.    -Augmentin x 1 week f/u prn.

## 2023-04-09 ENCOUNTER — Other Ambulatory Visit (HOSPITAL_COMMUNITY): Payer: Self-pay

## 2023-04-11 ENCOUNTER — Other Ambulatory Visit (HOSPITAL_COMMUNITY): Payer: Self-pay

## 2023-04-12 ENCOUNTER — Other Ambulatory Visit: Payer: Self-pay

## 2023-04-20 ENCOUNTER — Other Ambulatory Visit: Payer: Self-pay | Admitting: Internal Medicine

## 2023-04-20 ENCOUNTER — Other Ambulatory Visit (INDEPENDENT_AMBULATORY_CARE_PROVIDER_SITE_OTHER): Payer: Commercial Managed Care - PPO

## 2023-04-20 DIAGNOSIS — K900 Celiac disease: Secondary | ICD-10-CM

## 2023-04-20 DIAGNOSIS — O905 Postpartum thyroiditis: Secondary | ICD-10-CM

## 2023-04-20 NOTE — Addendum Note (Signed)
Addended by: Bufford Spikes on: 04/20/2023 08:56 AM   Modules accepted: Orders

## 2023-04-21 ENCOUNTER — Other Ambulatory Visit: Payer: Self-pay | Admitting: Internal Medicine

## 2023-04-21 ENCOUNTER — Other Ambulatory Visit: Payer: Self-pay

## 2023-04-21 ENCOUNTER — Other Ambulatory Visit (HOSPITAL_COMMUNITY): Payer: Self-pay

## 2023-04-21 MED ORDER — AMPHETAMINE-DEXTROAMPHETAMINE 10 MG PO TABS
10.0000 mg | ORAL_TABLET | Freq: Two times a day (BID) | ORAL | 0 refills | Status: DC
  Filled 2023-04-21: qty 60, 30d supply, fill #0

## 2023-04-26 ENCOUNTER — Encounter: Payer: Self-pay | Admitting: Gastroenterology

## 2023-04-28 LAB — CBC WITH DIFFERENTIAL/PLATELET
Basophils Absolute: 0 10*3/uL (ref 0.0–0.2)
Basos: 0 %
EOS (ABSOLUTE): 0.1 10*3/uL (ref 0.0–0.4)
Eos: 1 %
Hematocrit: 40.1 % (ref 34.0–46.6)
Hemoglobin: 13.4 g/dL (ref 11.1–15.9)
Immature Grans (Abs): 0 10*3/uL (ref 0.0–0.1)
Immature Granulocytes: 1 %
Lymphocytes Absolute: 2.7 10*3/uL (ref 0.7–3.1)
Lymphs: 32 %
MCH: 31.5 pg (ref 26.6–33.0)
MCHC: 33.4 g/dL (ref 31.5–35.7)
MCV: 94 fL (ref 79–97)
Monocytes Absolute: 0.6 10*3/uL (ref 0.1–0.9)
Monocytes: 7 %
Neutrophils Absolute: 5.1 10*3/uL (ref 1.4–7.0)
Neutrophils: 59 %
Platelets: 312 10*3/uL (ref 150–450)
RBC: 4.25 x10E6/uL (ref 3.77–5.28)
RDW: 13.6 % (ref 11.7–15.4)
WBC: 8.5 10*3/uL (ref 3.4–10.8)

## 2023-04-28 LAB — CMP14 + ANION GAP
ALT: 13 IU/L (ref 0–32)
AST: 13 IU/L (ref 0–40)
Albumin: 4.1 g/dL (ref 3.9–4.9)
Alkaline Phosphatase: 70 IU/L (ref 44–121)
Anion Gap: 16 mmol/L (ref 10.0–18.0)
BUN/Creatinine Ratio: 14 (ref 9–23)
BUN: 13 mg/dL (ref 6–20)
Bilirubin Total: 0.3 mg/dL (ref 0.0–1.2)
CO2: 20 mmol/L (ref 20–29)
Calcium: 9.4 mg/dL (ref 8.7–10.2)
Chloride: 104 mmol/L (ref 96–106)
Creatinine, Ser: 0.9 mg/dL (ref 0.57–1.00)
Globulin, Total: 1.8 g/dL (ref 1.5–4.5)
Glucose: 77 mg/dL (ref 70–99)
Potassium: 3.8 mmol/L (ref 3.5–5.2)
Sodium: 140 mmol/L (ref 134–144)
Total Protein: 5.9 g/dL — ABNORMAL LOW (ref 6.0–8.5)
eGFR: 87 mL/min/{1.73_m2} (ref >59)

## 2023-04-28 LAB — TSH: TSH: 1.53 u[IU]/mL (ref 0.450–4.500)

## 2023-04-28 LAB — CELIAC DISEASE AB SCREEN W/RFX
Antigliadin Abs, IgA: 50 U — ABNORMAL HIGH (ref 0–19)
IgA/Immunoglobulin A, Serum: 168 mg/dL (ref 87–352)
Transglutaminase IgA: 6 U/mL — ABNORMAL HIGH (ref 0–3)

## 2023-04-28 LAB — ENDOMYSIAL IGA ANTIBODY: Endomysial IgA: NEGATIVE

## 2023-05-05 ENCOUNTER — Other Ambulatory Visit: Payer: Self-pay

## 2023-05-05 DIAGNOSIS — G8929 Other chronic pain: Secondary | ICD-10-CM

## 2023-05-05 DIAGNOSIS — K900 Celiac disease: Secondary | ICD-10-CM

## 2023-05-05 MED ORDER — NA SULFATE-K SULFATE-MG SULF 17.5-3.13-1.6 GM/177ML PO SOLN
354.0000 mL | Freq: Once | ORAL | 0 refills | Status: AC
  Filled 2023-05-05: qty 354, 1d supply, fill #0

## 2023-05-05 NOTE — Telephone Encounter (Signed)
Good morning   Noted your last message . I guess if we should do everything to get to the bottom of the pain .   We could do thefollowing   1. EGD+colonoscopy and if negative we can do capsule on the same day before you leave the unit .   2. If above are negative we can get imaging of abdomen done or discuss at that time. There is an association with IBD and Celiac- we can get into the terminal ileum during colonoscopy and look at the mucosa  The risk of lymphoma from celiac is usually after many many years and less common at a younger age.  Please do let me know which order you would like to proceed and we can do it at your convinience on a day that works for you .   Happy to discuss further if you wish .   Let me know your thoughts   Sharlet Salina

## 2023-05-13 ENCOUNTER — Other Ambulatory Visit: Payer: Self-pay

## 2023-05-25 ENCOUNTER — Other Ambulatory Visit: Payer: Self-pay

## 2023-05-25 ENCOUNTER — Other Ambulatory Visit (HOSPITAL_COMMUNITY): Payer: Self-pay

## 2023-06-07 ENCOUNTER — Other Ambulatory Visit (HOSPITAL_COMMUNITY): Payer: Self-pay

## 2023-06-07 ENCOUNTER — Other Ambulatory Visit: Payer: Self-pay

## 2023-06-07 ENCOUNTER — Other Ambulatory Visit: Payer: Self-pay | Admitting: Internal Medicine

## 2023-06-07 MED ORDER — AMPHETAMINE-DEXTROAMPHET ER 20 MG PO CP24
20.0000 mg | ORAL_CAPSULE | Freq: Every day | ORAL | 0 refills | Status: DC
  Filled 2023-06-07: qty 30, 30d supply, fill #0

## 2023-06-07 MED ORDER — ZOLPIDEM TARTRATE 5 MG PO TABS
5.0000 mg | ORAL_TABLET | Freq: Every evening | ORAL | 2 refills | Status: DC | PRN
Start: 2023-06-07 — End: 2023-08-19
  Filled 2023-06-07 – 2023-06-10 (×2): qty 30, 30d supply, fill #0
  Filled 2023-07-12: qty 30, 30d supply, fill #1
  Filled 2023-08-02: qty 30, 30d supply, fill #2
  Filled 2023-08-17 (×2): qty 30, 30d supply, fill #3

## 2023-06-07 MED ORDER — SERTRALINE HCL 50 MG PO TABS
50.0000 mg | ORAL_TABLET | Freq: Every day | ORAL | 3 refills | Status: DC
  Filled 2023-06-07: qty 90, 90d supply, fill #0
  Filled 2023-09-29: qty 90, 90d supply, fill #1

## 2023-06-07 NOTE — Telephone Encounter (Signed)
Refill request.  Catherine Chang expressed desire to decrease ambien dosage at times she has been taking half a pill.  Will decrease to 5mg  tablets, may take 5-10mg  nightly.  Also expressed desire to go to extended release adderall, will change from 10mg  bid to Adderall XR 20mg  daily.

## 2023-06-08 ENCOUNTER — Encounter: Payer: Self-pay | Admitting: Gastroenterology

## 2023-06-10 ENCOUNTER — Other Ambulatory Visit (HOSPITAL_COMMUNITY): Payer: Self-pay

## 2023-06-10 ENCOUNTER — Other Ambulatory Visit: Payer: Self-pay

## 2023-06-15 ENCOUNTER — Ambulatory Visit
Admission: RE | Admit: 2023-06-15 | Discharge: 2023-06-15 | Disposition: A | Discharge status: Home / Self Care | Payer: Commercial Managed Care - PPO | Attending: Gastroenterology | Admitting: Gastroenterology

## 2023-06-15 ENCOUNTER — Other Ambulatory Visit: Payer: Self-pay

## 2023-06-15 ENCOUNTER — Ambulatory Visit: Payer: Commercial Managed Care - PPO | Admitting: Anesthesiology

## 2023-06-15 ENCOUNTER — Encounter: Payer: Self-pay | Admitting: Gastroenterology

## 2023-06-15 ENCOUNTER — Encounter
Admission: RE | Disposition: A | Discharge status: Home / Self Care | Payer: Self-pay | Source: Home / Self Care | Attending: Gastroenterology

## 2023-06-15 DIAGNOSIS — I10 Essential (primary) hypertension: Secondary | ICD-10-CM | POA: Diagnosis not present

## 2023-06-15 DIAGNOSIS — R109 Unspecified abdominal pain: Secondary | ICD-10-CM

## 2023-06-15 DIAGNOSIS — Z975 Presence of (intrauterine) contraceptive device: Secondary | ICD-10-CM | POA: Insufficient documentation

## 2023-06-15 DIAGNOSIS — R1011 Right upper quadrant pain: Secondary | ICD-10-CM | POA: Diagnosis not present

## 2023-06-15 DIAGNOSIS — Z793 Long term (current) use of hormonal contraceptives: Secondary | ICD-10-CM | POA: Diagnosis not present

## 2023-06-15 DIAGNOSIS — K900 Celiac disease: Secondary | ICD-10-CM | POA: Diagnosis not present

## 2023-06-15 DIAGNOSIS — G8929 Other chronic pain: Secondary | ICD-10-CM

## 2023-06-15 DIAGNOSIS — F32A Depression, unspecified: Secondary | ICD-10-CM | POA: Diagnosis not present

## 2023-06-15 HISTORY — PX: BIOPSY: SHX5522

## 2023-06-15 HISTORY — PX: COLONOSCOPY WITH PROPOFOL: SHX5780

## 2023-06-15 HISTORY — PX: ESOPHAGOGASTRODUODENOSCOPY (EGD) WITH PROPOFOL: SHX5813

## 2023-06-15 LAB — POCT PREGNANCY, URINE: Preg Test, Ur: NEGATIVE

## 2023-06-15 SURGERY — COLONOSCOPY WITH PROPOFOL
Anesthesia: General

## 2023-06-15 MED ORDER — PROPOFOL 10 MG/ML IV BOLUS
INTRAVENOUS | Status: AC
  Filled 2023-06-15: qty 20

## 2023-06-15 MED ORDER — PROPOFOL 500 MG/50ML IV EMUL
INTRAVENOUS | Status: DC | PRN
  Administered 2023-06-15: 150 ug/kg/min via INTRAVENOUS

## 2023-06-15 MED ORDER — PROPOFOL 10 MG/ML IV BOLUS
INTRAVENOUS | Status: DC | PRN
  Administered 2023-06-15: 20 mg via INTRAVENOUS
  Administered 2023-06-15: 80 mg via INTRAVENOUS

## 2023-06-15 MED ORDER — DEXMEDETOMIDINE HCL IN NACL 80 MCG/20ML IV SOLN
INTRAVENOUS | Status: DC | PRN
  Administered 2023-06-15: 4 ug via INTRAVENOUS
  Administered 2023-06-15 (×2): 8 ug via INTRAVENOUS
  Administered 2023-06-15 (×2): 4 ug via INTRAVENOUS

## 2023-06-15 MED ORDER — SODIUM CHLORIDE 0.9 % IV SOLN: INTRAVENOUS | Status: DC

## 2023-06-15 MED ORDER — LIDOCAINE HCL (PF) 2 % IJ SOLN
INTRAMUSCULAR | Status: AC
  Filled 2023-06-15: qty 5

## 2023-06-15 MED ORDER — LIDOCAINE HCL (CARDIAC) PF 100 MG/5ML IV SOSY
PREFILLED_SYRINGE | INTRAVENOUS | Status: DC | PRN
  Administered 2023-06-15: 50 mg via INTRAVENOUS

## 2023-06-15 MED ORDER — PROPOFOL 1000 MG/100ML IV EMUL
INTRAVENOUS | Status: AC
  Filled 2023-06-15: qty 100

## 2023-06-15 NOTE — Op Note (Signed)
[facility] Gastroenterology Patient Name: Catherine Chang Procedure Date: 06/15/2023 7:17 AM MRN: [id number] Account #: [id number] Date of Birth: 06-26-1991 Admit Type: Outpatient Age: 32 Room: [facility] ENDO ROOM 2 Gender: Female Note Status: Finalized Instrument Name: Nelda Marseille [id number] Procedure:             Colonoscopy Indications:           Abdominal pain Providers:             [name] MD, MD Referring MD:          [name] MD, MD (Referring MD), No Local Md, MD                         (Referring MD) Medicines:             Monitored Anesthesia Care Complications:         No immediate complications. Procedure:             Pre-Anesthesia Assessment:                        - Prior to the procedure, a History and Physical was                         performed, and patient medications, allergies and                         sensitivities were reviewed. The patient's tolerance                         of previous anesthesia was reviewed.                        - The risks and benefits of the procedure and the                         sedation options and risks were discussed with the                         patient. All questions were answered and informed                         consent was obtained.                        - ASA Grade Assessment: II - A patient with mild                         systemic disease.                        After obtaining informed consent, the colonoscope was                         passed under direct vision. Throughout the procedure,                         the patient's blood pressure, pulse, and oxygen                         saturations were monitored continuously.  The                         Colonoscope was introduced through the anus and                         advanced to the the cecum, identified by the                         appendiceal orifice. The colonoscopy was performed                         with ease. The patient  tolerated the procedure well.                         The quality of the bowel preparation was excellent.                         The ileocecal valve, appendiceal orifice, and rectum                         were photographed. Findings:      The perianal and digital rectal examinations were normal.      The terminal ileum appeared normal. Biopsies were taken with a cold       forceps for histology.      The colon (entire examined portion) appeared normal. Biopsies were taken       with a cold forceps for histology.      The exam was otherwise without abnormality on direct and retroflexion       views. Impression:            - The examined portion of the ileum was normal.                         Biopsied.                        - The entire examined colon is normal. Biopsied.                        - The examination was otherwise normal on direct and                         retroflexion views. Recommendation:        - Discharge patient to home (with escort).                        - Resume previous diet.                        - Continue present medications.                        - Await pathology results.                        - Return to GI office as previously scheduled. Procedure Code(s):     --- Professional ---                        [phone], Colonoscopy,  flexible; with biopsy, single or                         multiple Diagnosis Code(s):     --- Professional ---                        R10.9, Unspecified abdominal pain CPT copyright 2022 American Medical Association. All rights reserved. The codes documented in this report are preliminary and upon coder review may  be revised to meet current compliance requirements. [name], MD [name] MD, MD 06/15/2023 8:18:55 AM This report has been signed electronically. Number of Addenda: 0 Note Initiated On: 06/15/2023 7:17 AM Scope Withdrawal Time: 0 hours 7 minutes 35 seconds  Total Procedure Duration: 0 hours 13 minutes 19  seconds  Estimated Blood Loss:  Estimated blood loss: none.      [employer]

## 2023-06-15 NOTE — Transfer of Care (Signed)
Immediate Anesthesia Transfer of Care Note  Patient: Catherine Chang  Procedure(s) Performed: COLONOSCOPY WITH PROPOFOL ESOPHAGOGASTRODUODENOSCOPY (EGD) WITH PROPOFOL BIOPSY  Patient Location: PACU and Endoscopy Unit  Anesthesia Type:General  Level of Consciousness: drowsy and patient cooperative  Airway & Oxygen Therapy: Patient Spontanous Breathing  Post-op Assessment: Report given to RN and Post -op Vital signs reviewed and stable  Post vital signs: Reviewed and stable  Last Vitals:  Vitals Value Taken Time  BP 95/68 06/15/23 0822  Temp 35.9 C 06/15/23 0822  Pulse 85 06/15/23 0826  Resp 14 06/15/23 0826  SpO2 98 % 06/15/23 0826  Vitals shown include unfiled device data.  Last Pain:  Vitals:   06/15/23 0822  TempSrc: Temporal  PainSc: Asleep         Complications: No notable events documented.

## 2023-06-15 NOTE — Anesthesia Preprocedure Evaluation (Signed)
Anesthesia Evaluation  Patient identified by MRN, date of birth, ID band Patient awake    Reviewed: Allergy & Precautions, NPO status , Patient's Chart, lab work & pertinent test results  Airway Mallampati: III  TM Distance: >3 FB Neck ROM: full    Dental  (+) Chipped   Pulmonary neg pulmonary ROS, neg shortness of breath   Pulmonary exam normal        Cardiovascular Exercise Tolerance: Good hypertension, (-) angina (-) Past MI Normal cardiovascular exam     Neuro/Psych  Headaches PSYCHIATRIC DISORDERS       Neuromuscular disease    GI/Hepatic negative GI ROS, Neg liver ROS,neg GERD  ,,  Endo/Other  negative endocrine ROS    Renal/GU negative Renal ROS  negative genitourinary   Musculoskeletal   Abdominal   Peds  Hematology negative hematology ROS (+)   Anesthesia Other Findings Past Medical History: No date: ADHD 04/03/2020: Amniotic fluid index decreased No date: Depression 04/11/2020: Gestational hypertension 09/04/2017: Impaired glucose tolerance in pregnancy 04/03/2020: Oligohydramnios antepartum 10/07/2020: Postpartum thyroiditis 04/10/2020: Status post repeat low transverse cesarean section  Past Surgical History: 06/16/2022: BREAST BIOPSY; Left     Comment:  [place] LT BREAST BX W LOC DEV 1ST LESION IMG BX SPEC [place]               GUIDE 06/16/2022 [facility] MAMMOGRAPHY 2006: BREAST REDUCTION SURGERY; Bilateral 2005: BUNIONECTOMY; Right     Comment:  [place], [place] 03/23/2018: CESAREAN SECTION; N/A     Comment:  Procedure: PRIMARY CESAREAN SECTION;  Surgeon: Osborn Coho, MD;  Location: [facility] BIRTHING SUITES;  Service:               Obstetrics;  Laterality: N/A;  RNFA Requested 04/10/2020: CESAREAN SECTION; N/A     Comment:  Procedure: CESAREAN SECTION;  Surgeon: Essie Hart, MD;               Location: [facility] LD ORS;  Service: Obstetrics;  Laterality:               N/A; 11/14/2021:  ESOPHAGOGASTRODUODENOSCOPY (EGD) WITH PROPOFOL; N/A     Comment:  Procedure: ESOPHAGOGASTRODUODENOSCOPY (EGD) WITH               PROPOFOL;  Surgeon: [name], MD;  Location: [facility]               ENDOSCOPY;  Service: Gastroenterology;  Laterality: N/A; 04/10/2020: TUBAL LIGATION; Bilateral     Comment:  Procedure: BILATERAL TUBAL LIGATION;  Surgeon: Essie Hart, MD;  Location: [facility] LD ORS;  Service: Obstetrics;                Laterality: Bilateral;  BMI    Body Mass Index: 27.62 kg/m      Reproductive/Obstetrics negative OB ROS                             Anesthesia Physical Anesthesia Plan  ASA: 3  Anesthesia Plan: General   Post-op Pain Management:    Induction: Intravenous  PONV Risk Score and Plan: Propofol infusion and TIVA  Airway Management Planned: Natural Airway and Nasal Cannula  Additional Equipment:   Intra-op Plan:   Post-operative Plan:   Informed Consent: I have reviewed the patients History and Physical, chart, labs and discussed  the procedure including the risks, benefits and alternatives for the proposed anesthesia with the patient or authorized representative who has indicated his/her understanding and acceptance.     Dental Advisory Given  Plan Discussed with: Anesthesiologist, CRNA and Surgeon  Anesthesia Plan Comments: (Patient consented for risks of anesthesia including but not limited to:  - adverse reactions to medications - risk of airway placement if required - damage to eyes, teeth, lips or other oral mucosa - nerve damage due to positioning  - sore throat or hoarseness - Damage to heart, brain, nerves, lungs, other parts of body or loss of life  Patient voiced understanding and assent.)       Anesthesia Quick Evaluation

## 2023-06-15 NOTE — Op Note (Signed)
[facility] Gastroenterology Patient Name: Catherine Chang Procedure Date: 06/15/2023 7:18 AM MRN: [id number] Account #: [id number] Date of Birth: 16-Sep-1990 Admit Type: Outpatient Age: 32 Room: [facility] ENDO ROOM 2 Gender: Female Note Status: Finalized Instrument Name: Upper Endoscope [id number] Procedure:             Upper GI endoscopy Indications:           Follow-up of celiac disease Providers:             [name] MD, MD Referring MD:          [name] MD, MD (Referring MD), No Local Md, MD                         (Referring MD) Medicines:             Monitored Anesthesia Care Complications:         No immediate complications. Procedure:             Pre-Anesthesia Assessment:                        - Prior to the procedure, a History and Physical was                         performed, and patient medications, allergies and                         sensitivities were reviewed. The patient's tolerance                         of previous anesthesia was reviewed.                        - The risks and benefits of the procedure and the                         sedation options and risks were discussed with the                         patient. All questions were answered and informed                         consent was obtained.                        - ASA Grade Assessment: II - A patient with mild                         systemic disease.                        After obtaining informed consent, the endoscope was                         passed under direct vision. Throughout the procedure,                         the patient's blood pressure, pulse, and oxygen  saturations were monitored continuously. The Endoscope                         was introduced through the mouth, and advanced to the                         third part of duodenum. The upper GI endoscopy was                         accomplished with ease. The patient tolerated the                          procedure well. Findings:      The esophagus was normal.      The stomach was normal.      The cardia and gastric fundus were normal on retroflexion.      The examined duodenum was normal. Biopsies were taken with a cold       forceps for histology. Impression:            - Normal esophagus.                        - Normal stomach.                        - Normal examined duodenum. Biopsied. Recommendation:        - Await pathology results.                        - Perform a colonoscopy today. Procedure Code(s):     --- Professional ---                        [phone], Esophagogastroduodenoscopy, flexible,                         transoral; with biopsy, single or multiple Diagnosis Code(s):     --- Professional ---                        K90.0, Celiac disease CPT copyright 2022 American Medical Association. All rights reserved. The codes documented in this report are preliminary and upon coder review may  be revised to meet current compliance requirements. [name], MD [name] MD, MD 06/15/2023 8:00:29 AM This report has been signed electronically. Number of Addenda: 0 Note Initiated On: 06/15/2023 7:18 AM Estimated Blood Loss:  Estimated blood loss: none.      [facility]

## 2023-06-15 NOTE — H&P (Signed)
[name], MD [address], [address], [place], [place], [address] [address], [place], [place], [address] Phone: [phone]  Fax: [phone]  Primary Care Physician:  Gust Rung, DO   Pre-Procedure History & Physical: HPI:  Catherine Chang is a 32 y.o. female is here for an endoscopy and colonoscopy    Past Medical History:  Diagnosis Date   ADHD    Amniotic fluid index decreased 04/03/2020   Depression    Gestational hypertension 04/11/2020   Impaired glucose tolerance in pregnancy 09/04/2017   Oligohydramnios antepartum 04/03/2020   Postpartum thyroiditis 10/07/2020   Status post repeat low transverse cesarean section 04/10/2020    Past Surgical History:  Procedure Laterality Date   BREAST BIOPSY Left 06/16/2022   [place] LT BREAST BX W LOC DEV 1ST LESION IMG BX SPEC US GUIDE 06/16/2022 [facility] MAMMOGRAPHY   BREAST REDUCTION SURGERY Bilateral 2006   BUNIONECTOMY Right 2005   [place], [place]   CESAREAN SECTION N/A 03/23/2018   Procedure: PRIMARY CESAREAN SECTION;  Surgeon: Osborn Coho, MD;  Location: [facility] BIRTHING SUITES;  Service: Obstetrics;  Laterality: N/A;  RNFA Requested   CESAREAN SECTION N/A 04/10/2020   Procedure: CESAREAN SECTION;  Surgeon: Essie Hart, MD;  Location: [facility]S;  Service: Obstetrics;  Laterality: N/A;   ESOPHAGOGASTRODUODENOSCOPY (EGD) WITH PROPOFOL N/A 11/14/2021   Procedure: ESOPHAGOGASTRODUODENOSCOPY (EGD) WITH PROPOFOL;  Surgeon: [name], MD;  Location: [facility] ENDOSCOPY;  Service: Gastroenterology;  Laterality: N/A;   TUBAL LIGATION Bilateral 04/10/2020   Procedure: BILATERAL TUBAL LIGATION;  Surgeon: Essie Hart, MD;  Location: [facility]S;  Service: Obstetrics;  Laterality: Bilateral;    Prior to Admission medications   Medication Sig Start Date End Date Taking? Authorizing Provider  amphetamine-dextroamphetamine (ADDERALL XR) 20 MG 24 hr capsule Take 1 capsule (20 mg total) by mouth daily with breakfast. 06/07/23    Gust Rung, DO  dicyclomine (BENTYL) 10 MG capsule Take 1 capsule (10 mg total) by mouth 4 (four) times daily -  before meals and at bedtime. 03/22/23   [name], MD  levonorgestrel (MIRENA, 52 MG,) 20 MCG/DAY IUD 1 each by Intrauterine route once. 10/27/22   [provider]  Rimegepant Sulfate (NURTEC) 75 MG TBDP Dissolve 1 tablet by mouth daily as needed for migraine. 07/22/22   Gust Rung, DO  sertraline (ZOLOFT) 50 MG tablet Take 1 tablet (50 mg total) by mouth daily. 06/07/23   Gust Rung, DO  zolpidem (AMBIEN) 5 MG tablet Take 1-2 tablets (5-10 mg total) by mouth at bedtime as needed for sleep. 06/07/23   Gust Rung, DO    Allergies as of 05/05/2023 - Review Complete 01/28/2023  Allergen Reaction Noted   Wound dressing adhesive Hives 05/25/2022    Family History  Problem Relation Age of Onset   Hypothyroidism Mother    Obstructive Sleep Apnea Father    Bipolar disorder Brother    Obstructive Sleep Apnea Brother    Hypothyroidism Maternal Aunt    Hypothyroidism Maternal Grandmother    Heart disease Maternal Grandfather    Obstructive Sleep Apnea Paternal Grandmother    Heart disease Paternal Grandfather        sudden cardiac arrest   Obstructive Sleep Apnea Paternal Grandfather    Cleft palate Daughter     Social History   Socioeconomic History   Marital status: Married    Spouse name: Harrison Mons   Number of children: 2   Years of education:  Not on file   Highest education level: Master's degree (e.g., MA, MS, MEng, MEd, MSW, MBA)  Occupational History   Not on file  Tobacco Use   Smoking status: Never   Smokeless tobacco: Never  Vaping Use   Vaping status: Never Used  Substance and Sexual Activity   Alcohol use: Yes    Comment: Occasionally. wine   Drug use: Never   Sexual activity: Yes  Other Topics Concern   Not on file  Social History Narrative   Lives at home with husband and 2 children   Right handed caffeine: 10oz cup of coffee  in the morning. Otilio Carpen 2nd   Social Determinants of Health   Financial Resource Strain: Not on file  Food Insecurity: Not on file  Transportation Needs: Not on file  Physical Activity: Not on file  Stress: Not on file  Social Connections: Not on file  Intimate Partner Violence: Not on file    Review of Systems: See HPI, otherwise negative ROS  Physical Exam: BP 127/86   Pulse 71   Temp (!) 96.9 F (36.1 C) (Temporal)   Resp 16   Ht 5\' 2"  (1.575 m)   Wt 68.5 kg   SpO2 100%   BMI 27.62 kg/m  General:   Alert,  pleasant and cooperative in NAD Head:  Normocephalic and atraumatic. Neck:  Supple; no masses or thyromegaly. Lungs:  Clear throughout to auscultation, normal respiratory effort.    Heart:  +S1, +S2, Regular rate and rhythm, No edema. Abdomen:  Soft, nontender and nondistended. Normal bowel sounds, without guarding, and without rebound.   Neurologic:  Alert and  oriented x4;  grossly normal neurologically.  Impression/Plan: Catherine Chang is here for an endoscopy and colonoscopy  to be performed for  evaluation of celiac disease , abdominal pain .     Risks, benefits, limitations, and alternatives regarding endoscopy have been reviewed with the patient.  Questions have been answered.  All parties agreeable.   [name], MD  06/15/2023, 7:45 AM

## 2023-06-15 NOTE — Anesthesia Postprocedure Evaluation (Signed)
Anesthesia Post Note  Patient: Catherine Chang  Procedure(s) Performed: COLONOSCOPY WITH PROPOFOL ESOPHAGOGASTRODUODENOSCOPY (EGD) WITH PROPOFOL BIOPSY  Patient location during evaluation: Endoscopy Anesthesia Type: General Level of consciousness: awake and alert Pain management: pain level controlled Vital Signs Assessment: post-procedure vital signs reviewed and stable Respiratory status: spontaneous breathing, nonlabored ventilation, respiratory function stable and patient connected to nasal cannula oxygen Cardiovascular status: blood pressure returned to baseline and stable Postop Assessment: no apparent nausea or vomiting Anesthetic complications: no   No notable events documented.   Last Vitals:  Vitals:   06/15/23 0832 06/15/23 0842  BP: 97/67 100/78  Pulse: 71 70  Resp: 13 15  Temp:    SpO2: 98% 100%    Last Pain:  Vitals:   06/15/23 0842  TempSrc:   PainSc: 0-No pain                 [name] [name]

## 2023-06-16 ENCOUNTER — Telehealth: Payer: Self-pay

## 2023-06-16 ENCOUNTER — Encounter: Payer: Self-pay | Admitting: Gastroenterology

## 2023-06-16 DIAGNOSIS — G8929 Other chronic pain: Secondary | ICD-10-CM

## 2023-06-16 DIAGNOSIS — K900 Celiac disease: Secondary | ICD-10-CM

## 2023-06-16 DIAGNOSIS — R768 Other specified abnormal immunological findings in serum: Secondary | ICD-10-CM

## 2023-06-16 LAB — SURGICAL PATHOLOGY

## 2023-06-16 NOTE — Telephone Encounter (Signed)
-----   Message from [name] sent at 06/16/2023 12:37 PM EDT ----- I have discusesed results with Dr Antony Contras and plan is for MR enterography to rule out small bowel crohns disease - can we schedule please and inform

## 2023-06-17 ENCOUNTER — Other Ambulatory Visit: Payer: Self-pay

## 2023-06-17 DIAGNOSIS — Z98891 History of uterine scar from previous surgery: Secondary | ICD-10-CM

## 2023-06-17 HISTORY — DX: History of uterine scar from previous surgery: Z98.891

## 2023-06-24 ENCOUNTER — Ambulatory Visit
Admission: RE | Admit: 2023-06-24 | Discharge: 2023-06-24 | Disposition: A | Discharge status: Home / Self Care | Payer: Commercial Managed Care - PPO | Source: Ambulatory Visit | Attending: Gastroenterology | Admitting: Gastroenterology

## 2023-06-24 DIAGNOSIS — G8929 Other chronic pain: Secondary | ICD-10-CM | POA: Diagnosis not present

## 2023-06-24 DIAGNOSIS — R768 Other specified abnormal immunological findings in serum: Secondary | ICD-10-CM | POA: Diagnosis not present

## 2023-06-24 DIAGNOSIS — R1011 Right upper quadrant pain: Secondary | ICD-10-CM | POA: Insufficient documentation

## 2023-06-24 DIAGNOSIS — K900 Celiac disease: Secondary | ICD-10-CM | POA: Diagnosis not present

## 2023-06-24 DIAGNOSIS — Z975 Presence of (intrauterine) contraceptive device: Secondary | ICD-10-CM | POA: Diagnosis not present

## 2023-06-24 MED ORDER — GADOBUTROL 1 MMOL/ML IV SOLN
6.0000 mL | Freq: Once | INTRAVENOUS | Status: AC | PRN
  Administered 2023-06-24: 6 mL via INTRAVENOUS

## 2023-07-12 ENCOUNTER — Other Ambulatory Visit (HOSPITAL_COMMUNITY): Payer: Self-pay

## 2023-07-12 ENCOUNTER — Other Ambulatory Visit: Payer: Self-pay

## 2023-07-17 ENCOUNTER — Other Ambulatory Visit: Payer: Self-pay | Admitting: Internal Medicine

## 2023-07-17 ENCOUNTER — Other Ambulatory Visit (HOSPITAL_COMMUNITY): Payer: Self-pay

## 2023-07-17 MED ORDER — AMPHETAMINE-DEXTROAMPHET ER 20 MG PO CP24
20.0000 mg | ORAL_CAPSULE | Freq: Every day | ORAL | 0 refills | Status: DC
  Filled 2023-07-17 – 2023-07-19 (×2): qty 30, 30d supply, fill #0

## 2023-07-19 ENCOUNTER — Other Ambulatory Visit (HOSPITAL_COMMUNITY): Payer: Self-pay

## 2023-08-02 ENCOUNTER — Other Ambulatory Visit: Payer: Self-pay

## 2023-08-02 ENCOUNTER — Other Ambulatory Visit (HOSPITAL_COMMUNITY): Payer: Self-pay

## 2023-08-03 ENCOUNTER — Other Ambulatory Visit (HOSPITAL_COMMUNITY): Payer: Self-pay

## 2023-08-17 ENCOUNTER — Other Ambulatory Visit (HOSPITAL_COMMUNITY): Payer: Self-pay

## 2023-08-17 ENCOUNTER — Other Ambulatory Visit: Payer: Self-pay

## 2023-08-19 ENCOUNTER — Other Ambulatory Visit (HOSPITAL_COMMUNITY): Payer: Self-pay

## 2023-08-19 ENCOUNTER — Other Ambulatory Visit: Payer: Self-pay

## 2023-08-19 ENCOUNTER — Other Ambulatory Visit: Payer: Self-pay | Admitting: Internal Medicine

## 2023-08-19 MED ORDER — NURTEC 75 MG PO TBDP
1.0000 | ORAL_TABLET | Freq: Every day | ORAL | 5 refills | Status: AC | PRN
  Filled 2023-08-19: qty 16, 30d supply, fill #0
  Filled 2023-08-20: qty 16, 26d supply, fill #0

## 2023-08-19 MED ORDER — ZOLPIDEM TARTRATE 10 MG PO TABS
5.0000 mg | ORAL_TABLET | Freq: Every evening | ORAL | 5 refills | Status: AC | PRN
  Filled 2023-08-19 – 2023-08-20 (×2): qty 30, 30d supply, fill #0
  Filled 2023-09-18: qty 30, 30d supply, fill #1
  Filled 2023-10-15: qty 30, 30d supply, fill #2
  Filled 2023-11-12: qty 30, 30d supply, fill #3

## 2023-08-19 NOTE — Addendum Note (Signed)
 Addended by: Carlynn Purl C on: 08/19/2023 02:02 PM   Modules accepted: Orders

## 2023-08-20 ENCOUNTER — Other Ambulatory Visit (HOSPITAL_COMMUNITY): Payer: Self-pay

## 2023-08-20 ENCOUNTER — Other Ambulatory Visit: Payer: Self-pay

## 2023-09-02 ENCOUNTER — Other Ambulatory Visit (HOSPITAL_COMMUNITY): Payer: Self-pay

## 2023-09-02 ENCOUNTER — Telehealth: Payer: Self-pay | Admitting: Internal Medicine

## 2023-09-02 ENCOUNTER — Other Ambulatory Visit: Payer: Self-pay

## 2023-09-02 MED ORDER — METHOCARBAMOL 750 MG PO TABS
1500.0000 mg | ORAL_TABLET | Freq: Three times a day (TID) | ORAL | 1 refills | Status: DC
  Filled 2023-09-02: qty 60, 10d supply, fill #0
  Filled 2023-09-22 (×2): qty 60, 10d supply, fill #1

## 2023-09-02 NOTE — Telephone Encounter (Signed)
Pulled muscle in back pulling kids in sled. Taking ibuprofen, requested robaxin.  Sent refill. She will let me know if it doesn't resolve as expected.

## 2023-09-07 ENCOUNTER — Other Ambulatory Visit: Payer: Self-pay | Admitting: Internal Medicine

## 2023-09-07 ENCOUNTER — Other Ambulatory Visit (HOSPITAL_COMMUNITY): Payer: Self-pay

## 2023-09-07 MED ORDER — AMPHETAMINE-DEXTROAMPHET ER 20 MG PO CP24
20.0000 mg | ORAL_CAPSULE | Freq: Every day | ORAL | 0 refills | Status: DC
  Filled 2023-09-07: qty 30, 30d supply, fill #0

## 2023-09-18 ENCOUNTER — Other Ambulatory Visit (HOSPITAL_COMMUNITY): Payer: Self-pay

## 2023-09-22 ENCOUNTER — Other Ambulatory Visit (HOSPITAL_COMMUNITY): Payer: Self-pay

## 2023-09-30 ENCOUNTER — Other Ambulatory Visit (HOSPITAL_COMMUNITY): Payer: Self-pay

## 2023-10-07 ENCOUNTER — Other Ambulatory Visit: Payer: Self-pay | Admitting: Internal Medicine

## 2023-10-07 ENCOUNTER — Other Ambulatory Visit (HOSPITAL_COMMUNITY): Payer: Self-pay

## 2023-10-07 MED ORDER — AMPHETAMINE-DEXTROAMPHET ER 20 MG PO CP24
20.0000 mg | ORAL_CAPSULE | Freq: Every day | ORAL | 0 refills | Status: DC
  Filled 2023-10-07: qty 30, 30d supply, fill #0

## 2023-10-08 ENCOUNTER — Other Ambulatory Visit (HOSPITAL_COMMUNITY): Payer: Self-pay

## 2023-10-11 ENCOUNTER — Ambulatory Visit (INDEPENDENT_AMBULATORY_CARE_PROVIDER_SITE_OTHER): Payer: Commercial Managed Care - PPO | Admitting: Internal Medicine

## 2023-10-11 DIAGNOSIS — J069 Acute upper respiratory infection, unspecified: Secondary | ICD-10-CM

## 2023-10-11 LAB — RESP PANEL BY RT-PCR (FLU A&B, COVID) ARPGX2
Influenza A by PCR: NEGATIVE
Influenza B by PCR: NEGATIVE
SARS Coronavirus 2 by RT PCR: NEGATIVE

## 2023-10-11 NOTE — Progress Notes (Signed)
 Sinus congestion and upper respirtory ymptoms x 1 week, wants flu and covid testing.

## 2023-10-15 ENCOUNTER — Other Ambulatory Visit: Payer: Self-pay

## 2023-10-20 ENCOUNTER — Encounter: Payer: Self-pay | Admitting: Gastroenterology

## 2023-10-20 LAB — MISC LABCORP TEST (SEND OUT): Labcorp test code: 9985

## 2023-10-26 ENCOUNTER — Telehealth: Payer: Self-pay

## 2023-10-26 NOTE — Telephone Encounter (Signed)
 Prior Authorization for patient (Nurtec 75MG  dispersible tablets) came through on cover my meds was submitted with last office notes awaiting approval or denial.  KEY:[id number]

## 2023-10-27 NOTE — Telephone Encounter (Signed)
 Catherine Chang (Key: [id number]) PA Case ID #: [id number] Need Help? Call us at [phone] Outcome Approved on March 11 by [employer] 2017 The request has been approved. The authorization is effective from 10/26/2023 to 10/24/2024, as long as the member is enrolled in their current health plan. The request was reviewed and approved by a licensed clinical pharmacist. This request is approved with the following quantity limits: 18 tablets per 30 days. A written notification letter will follow with additional details. Effective Date: 10/26/2023 Authorization Expiration Date: 10/24/2024 Drug Nurtec 75MG  dispersible tablets ePA cloud logo Form [employer] ePA Form 2017 NCPDP

## 2023-11-05 ENCOUNTER — Telehealth: Payer: Self-pay | Admitting: Internal Medicine

## 2023-11-05 ENCOUNTER — Other Ambulatory Visit (HOSPITAL_COMMUNITY): Payer: Self-pay

## 2023-11-05 MED ORDER — AMPHETAMINE-DEXTROAMPHET ER 20 MG PO CP24
20.0000 mg | ORAL_CAPSULE | Freq: Every day | ORAL | 0 refills | Status: AC
  Filled 2023-11-05: qty 30, 30d supply, fill #0

## 2023-11-05 MED ORDER — AMOXICILLIN-POT CLAVULANATE 875-125 MG PO TABS
1.0000 | ORAL_TABLET | Freq: Two times a day (BID) | ORAL | 0 refills | Status: DC
  Filled 2023-11-05: qty 14, 7d supply, fill #0

## 2023-11-05 NOTE — Telephone Encounter (Signed)
 Feels like she has a dental infection of back right molar, worsening pain there for 3-4 days,  Dental office called but close today.  Will send in Rx for augmentin and advise tylenol.  She will call dentist back on Monday.  Adderall due to refill.

## 2023-11-11 ENCOUNTER — Ambulatory Visit: Payer: Commercial Managed Care - PPO | Admitting: Internal Medicine

## 2023-11-11 ENCOUNTER — Other Ambulatory Visit (HOSPITAL_COMMUNITY): Payer: Self-pay

## 2023-11-11 ENCOUNTER — Encounter: Payer: Self-pay | Admitting: Internal Medicine

## 2023-11-11 VITALS — BP 135/97 | HR 57 | Temp 98.0°F | Ht 62.0 in | Wt 166.7 lb

## 2023-11-11 DIAGNOSIS — F3341 Major depressive disorder, recurrent, in partial remission: Secondary | ICD-10-CM

## 2023-11-11 DIAGNOSIS — O905 Postpartum thyroiditis: Secondary | ICD-10-CM

## 2023-11-11 DIAGNOSIS — K900 Celiac disease: Secondary | ICD-10-CM | POA: Diagnosis not present

## 2023-11-11 DIAGNOSIS — G43109 Migraine with aura, not intractable, without status migrainosus: Secondary | ICD-10-CM

## 2023-11-11 DIAGNOSIS — F909 Attention-deficit hyperactivity disorder, unspecified type: Secondary | ICD-10-CM

## 2023-11-11 DIAGNOSIS — R030 Elevated blood-pressure reading, without diagnosis of hypertension: Secondary | ICD-10-CM

## 2023-11-11 DIAGNOSIS — G4700 Insomnia, unspecified: Secondary | ICD-10-CM

## 2023-11-11 MED ORDER — SERTRALINE HCL 50 MG PO TABS
50.0000 mg | ORAL_TABLET | Freq: Every day | ORAL | 3 refills | Status: AC
  Filled 2023-11-11: qty 90, 90d supply, fill #0

## 2023-11-11 MED ORDER — TRAZODONE HCL 100 MG PO TABS
100.0000 mg | ORAL_TABLET | Freq: Every day | ORAL | 5 refills | Status: AC
  Filled 2023-11-11: qty 30, 30d supply, fill #0

## 2023-11-11 NOTE — Assessment & Plan Note (Signed)
 Suspect medication versus stress related, she will monitor.

## 2023-11-11 NOTE — Progress Notes (Signed)
 Established Patient Office Visit  Subjective   Patient ID: Catherine Chang, female    DOB: [birth year]  Age: 33 y.o. MRN: [id number]  Chief Complaint  Patient presents with   Medical Management of Chronic Issues    Weight gain. Check thyroid. Cold.   Dr. Antony Contras returns for follow-up of multiple chronic conditions including celiac's disease, migraines, insomnia.  She is recovering from a recent URI.  Feels much better and has returned to work has been taking some Sudafed and suspects this is the reason for her elevated blood pressure readings today.  Has no history of hypertension all of her previous values have been normotensive.  She has had a documented 15 pound weight gain in the last 6 months she attributes a lot of this to stress.  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  She is set to move next week and has had a lot of work to tie up loose ends and sell her house.  Notes that her diet has not really changed as she is maintained a strict gluten-free diet due to her history of celiac's disease.  Her migraines have actually been pretty well-controlled after starting a gluten-free diet.  She notes taking Nurtec 1-2 times a month but otherwise feels it is much improved.  Her chronic insomnia has been mostly manageable.  She still been taking Ambien half tablet to 1 tablet if needed for sleep which works well.  Changed to Adderall XR has been mostly beneficial usually takes on more stressful days at work such as the inpatient service.  Please see assessment and plan below for additional details.  Past Medical History:  Diagnosis Date   ADHD    Amniotic fluid index decreased 04/03/2020   Celiac disease 01/08/2022   Depression    Gestational hypertension 04/11/2020   History of bilateral breast reduction surgery 09/04/2017   Age 36  Age 63     History of cesarean section 06/17/2023   x 1 for  Repeat     Impaired glucose tolerance in pregnancy 09/04/2017   Insomnia 01/19/2017   Migraine wtih Aura 05/05/2018   Oligohydramnios antepartum 04/03/2020   Postpartum thyroiditis 10/07/2020   Recurrent major depressive disorder, in partial remission (HCC) 06/20/2021   S/P tubal ligation 04/11/2020   Status post repeat low transverse cesarean section 04/10/2020       Objective:     BP (!) 135/97 (BP Location: Left Arm, Patient Position: Sitting, Cuff Size: Normal)   Pulse (!) 57   Temp 98 F (36.7 C) (Oral)   Ht 5\' 2"  (1.575 m)   Wt 166 lb 11.2 oz (75.6 kg)   SpO2 100% Comment: RA  BMI 30.49 kg/m  BP Readings from Last 3 Encounters:  11/11/23 (!) 135/97  06/15/23 100/78  01/28/23 128/82   Wt Readings from Last 3 Encounters:  11/11/23 166 lb 11.2 oz (75.6 kg)  06/15/23 151 lb (68.5 kg)  01/28/23 149 lb 4.8 oz (67.7 kg)      Physical Exam Vitals and nursing note reviewed.  Constitutional:      Appearance: Normal appearance.  Eyes:     Conjunctiva/sclera: Conjunctivae normal.  Pulmonary:     Effort: Pulmonary effort is normal.  Skin:    General: Skin is warm and dry.  Neurological:     Mental Status: She is alert.  Psychiatric:        Mood and Affect: Mood normal.  Behavior: Behavior normal.      No results found for any visits on 11/11/23.  Last hemoglobin A1c Lab Results  Component Value Date   HGBA1C 5.5 09/22/2021      The ASCVD Risk score (Arnett DK, et al., 2019) failed to calculate for the following reasons:   The 2019 ASCVD risk score is only valid for ages 77 to 77    Assessment & Plan:   Problem List Items Addressed This Visit       Cardiovascular and Mediastinum   Migraine with Aura (Chronic)   Migraines have been much improved proved on gluten-free diet.  Responds well to abortive therapy with Nurtec using infrequently once to twice a month.      Relevant Medications   traZODone (DESYREL) 100 MG tablet   sertraline  (ZOLOFT) 50 MG tablet     Digestive   Celiac disease - Primary (Chronic)   Diagnosed in March 2023,tTG IgA was 72, EGD biopsy showed complete villous atrophy.  We trended serial antibodies downtrended.  Repeat EGD in October 2025 confirm mucosal healing after strict gluten-free diet.  GI recommends trending antibodies annually.  Check today vitamin D CMP.  Of note she has had an extensive GI workup for alternative causes with Dr Lupita Raider, [facility] 05/2023: normal with normal biopsy HIDA scan 4/24: Normal MR enterography 06/2023: Normal      Relevant Orders   Vitamin D (25 hydroxy)   CMP14 + Anion Gap   Celiac Disease Ab Screen w/Rfx     Endocrine   Postpartum thyroiditis (Chronic)   Postpartum hypothyroidism TSH was 47 in February 2022 6 months after giving birth.  Positive TPO antibodies was symptomatic at the time we treated with low-dose Synthroid for about a year and slowly tapered off.  Serial TSH checks have been normal and off of Synthroid.  Has had some recent weight gain we will go ahead and repeat a TSH.  Would recommend checking TSH at least yearly or with change in symptoms given positive TPO antibodies making her more at higher risk      Relevant Orders   TSH     Other   Recurrent major depressive disorder, in partial remission (HCC) (Chronic)   Doing well with sertraline 50 mg daily      Relevant Medications   traZODone (DESYREL) 100 MG tablet   sertraline (ZOLOFT) 50 MG tablet   ADHD (Chronic)   Has longstanding history of ADHD.  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  With medication shortages last year we changed over to the extended release which has been working well when taken in the morning.  Continue Adderall XR 20 mg daily with breakfast.      Insomnia   This has been chronic and persistent even when she went off Adderall for about a year.  Originally treated with trazodone which was felt to  contribute to migraines.  Has been on Ambien 5 to 10 mg nightly as needed which has been effective.  Migraines are now well controlled and felt to have possiblty have been worsened by Celiac's disease thus interested in trying trazodone again.    -Send Rx for Trazodone 50-100mg  at bedtime PRN -Ma alternate with Ambien 5-10mg  at bedtime PRN       Elevated BP without diagnosis of hypertension   Suspect medication versus stress related, she will monitor.       Return if symptoms worsen or fail to  improve.    Gust Rung, DO

## 2023-11-11 NOTE — Assessment & Plan Note (Signed)
 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  Positive TPO antibodies was symptomatic at the time we treated with low-dose Synthroid for about a year and slowly tapered off.  Serial TSH checks have been normal and off of Synthroid.  Has had some recent weight gain we will go ahead and repeat a TSH.  Would recommend checking TSH at least yearly or with change in symptoms given positive TPO antibodies making her more at higher risk

## 2023-11-11 NOTE — Assessment & Plan Note (Addendum)
 This has been chronic and persistent even when she went off Adderall for about a year.  Originally treated with trazodone which was felt to contribute to migraines.  Has been on Ambien 5 to 10 mg nightly as needed which has been effective.  Migraines are now well controlled and felt to have possiblty have been worsened by Celiac's disease thus interested in trying trazodone again.    -Send Rx for Trazodone 50-100mg  at bedtime PRN -Ma alternate with Ambien 5-10mg  at bedtime PRN

## 2023-11-11 NOTE — Assessment & Plan Note (Addendum)
 Diagnosed in March 2023,tTG IgA was 72, EGD biopsy showed complete villous atrophy.  We trended serial antibodies downtrended.  Repeat EGD in October 2025 confirm mucosal healing after strict gluten-free diet.  GI recommends trending antibodies annually.  Check today vitamin D CMP.  Of note she has had an extensive GI workup for alternative causes with Dr Lupita Raider, [facility] 05/2023: normal with normal biopsy HIDA scan 4/24: Normal MR enterography 06/2023: Normal

## 2023-11-11 NOTE — Assessment & Plan Note (Signed)
Doing well with sertraline 50 mg daily.

## 2023-11-11 NOTE — Assessment & Plan Note (Signed)
 Has longstanding history of ADHD.  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  With medication shortages last year we changed over to the extended release which has been working well when taken in the morning.  Continue Adderall XR 20 mg daily with breakfast.

## 2023-11-11 NOTE — Assessment & Plan Note (Signed)
 Migraines have been much improved proved on gluten-free diet.  Responds well to abortive therapy with Nurtec using infrequently once to twice a month.

## 2023-11-12 ENCOUNTER — Other Ambulatory Visit: Payer: Self-pay

## 2023-11-12 ENCOUNTER — Other Ambulatory Visit (HOSPITAL_COMMUNITY): Payer: Self-pay

## 2023-11-19 LAB — CMP14 + ANION GAP
ALT: 18 IU/L (ref 0–32)
AST: 17 IU/L (ref 0–40)
Albumin: 4.6 g/dL (ref 3.9–4.9)
Alkaline Phosphatase: 82 IU/L (ref 44–121)
Anion Gap: 16 mmol/L (ref 10.0–18.0)
BUN/Creatinine Ratio: 18 (ref 9–23)
BUN: 12 mg/dL (ref 6–20)
Bilirubin Total: 0.2 mg/dL (ref 0.0–1.2)
CO2: 20 mmol/L (ref 20–29)
Calcium: 9.7 mg/dL (ref 8.7–10.2)
Chloride: 103 mmol/L (ref 96–106)
Creatinine, Ser: 0.67 mg/dL (ref 0.57–1.00)
Globulin, Total: 2.1 g/dL (ref 1.5–4.5)
Glucose: 102 mg/dL — ABNORMAL HIGH (ref 70–99)
Potassium: 4.4 mmol/L (ref 3.5–5.2)
Sodium: 139 mmol/L (ref 134–144)
Total Protein: 6.7 g/dL (ref 6.0–8.5)
eGFR: 118 mL/min/{1.73_m2} (ref >59)

## 2023-11-19 LAB — CELIAC DISEASE AB SCREEN W/RFX
Antigliadin Abs, IgA: 45 U — ABNORMAL HIGH (ref 0–19)
IgA/Immunoglobulin A, Serum: 179 mg/dL (ref 87–352)
Transglutaminase IgA: 6 U/mL — ABNORMAL HIGH (ref 0–3)

## 2023-11-19 LAB — VITAMIN D 25 HYDROXY (VIT D DEFICIENCY, FRACTURES): Vit D, 25-Hydroxy: 34 ng/mL (ref 30.0–100.0)

## 2023-11-19 LAB — ENDOMYSIAL IGA ANTIBODY

## 2023-11-19 LAB — TSH: TSH: 2.74 u[IU]/mL (ref 0.450–4.500)
